# Patient Record
Sex: Male | Born: 1945 | Race: White | Hispanic: No | Smoking: Never smoker
Health system: Southern US, Community
[De-identification: ages and names within clinical notes are randomized; demographics above are authoritative.]

## PROBLEM LIST (undated history)

## (undated) DIAGNOSIS — D649 Anemia, unspecified: Secondary | ICD-10-CM

## (undated) DIAGNOSIS — K219 Gastro-esophageal reflux disease without esophagitis: Secondary | ICD-10-CM

## (undated) DIAGNOSIS — I10 Essential (primary) hypertension: Secondary | ICD-10-CM

## (undated) DIAGNOSIS — I639 Cerebral infarction, unspecified: Secondary | ICD-10-CM

## (undated) DIAGNOSIS — F909 Attention-deficit hyperactivity disorder, unspecified type: Secondary | ICD-10-CM

## (undated) DIAGNOSIS — Z87442 Personal history of urinary calculi: Secondary | ICD-10-CM

## (undated) DIAGNOSIS — G8194 Hemiplegia, unspecified affecting left nondominant side: Secondary | ICD-10-CM

## (undated) HISTORY — PX: APPENDECTOMY: SHX54

---

## 2012-09-01 DIAGNOSIS — F1521 Other stimulant dependence, in remission: Secondary | ICD-10-CM | POA: Insufficient documentation

## 2012-09-02 DIAGNOSIS — H9190 Unspecified hearing loss, unspecified ear: Secondary | ICD-10-CM | POA: Insufficient documentation

## 2013-01-24 DIAGNOSIS — E785 Hyperlipidemia, unspecified: Secondary | ICD-10-CM | POA: Insufficient documentation

## 2013-01-24 DIAGNOSIS — N400 Enlarged prostate without lower urinary tract symptoms: Secondary | ICD-10-CM | POA: Insufficient documentation

## 2013-01-24 DIAGNOSIS — I10 Essential (primary) hypertension: Secondary | ICD-10-CM | POA: Insufficient documentation

## 2013-06-28 DIAGNOSIS — G3184 Mild cognitive impairment, so stated: Secondary | ICD-10-CM | POA: Insufficient documentation

## 2013-07-11 DIAGNOSIS — M1990 Unspecified osteoarthritis, unspecified site: Secondary | ICD-10-CM | POA: Insufficient documentation

## 2013-09-07 DIAGNOSIS — R419 Unspecified symptoms and signs involving cognitive functions and awareness: Secondary | ICD-10-CM | POA: Insufficient documentation

## 2013-09-07 DIAGNOSIS — F6081 Narcissistic personality disorder: Secondary | ICD-10-CM | POA: Insufficient documentation

## 2013-10-04 DIAGNOSIS — F1320 Sedative, hypnotic or anxiolytic dependence, uncomplicated: Secondary | ICD-10-CM | POA: Insufficient documentation

## 2014-05-04 DIAGNOSIS — I639 Cerebral infarction, unspecified: Secondary | ICD-10-CM | POA: Insufficient documentation

## 2014-12-01 DIAGNOSIS — F22 Delusional disorders: Secondary | ICD-10-CM | POA: Insufficient documentation

## 2016-02-11 DIAGNOSIS — G4700 Insomnia, unspecified: Secondary | ICD-10-CM | POA: Insufficient documentation

## 2016-03-12 DIAGNOSIS — F419 Anxiety disorder, unspecified: Secondary | ICD-10-CM | POA: Insufficient documentation

## 2016-12-19 DIAGNOSIS — R296 Repeated falls: Secondary | ICD-10-CM | POA: Insufficient documentation

## 2018-09-15 DIAGNOSIS — F1320 Sedative, hypnotic or anxiolytic dependence, uncomplicated: Secondary | ICD-10-CM | POA: Insufficient documentation

## 2019-06-11 DIAGNOSIS — Z8673 Personal history of transient ischemic attack (TIA), and cerebral infarction without residual deficits: Secondary | ICD-10-CM | POA: Insufficient documentation

## 2019-08-04 DIAGNOSIS — F1021 Alcohol dependence, in remission: Secondary | ICD-10-CM | POA: Insufficient documentation

## 2019-10-18 DIAGNOSIS — H547 Unspecified visual loss: Secondary | ICD-10-CM | POA: Insufficient documentation

## 2020-01-16 DIAGNOSIS — I248 Other forms of acute ischemic heart disease: Secondary | ICD-10-CM | POA: Insufficient documentation

## 2020-01-19 DIAGNOSIS — I441 Atrioventricular block, second degree: Secondary | ICD-10-CM | POA: Insufficient documentation

## 2020-04-12 ENCOUNTER — Emergency Department: Payer: Medicare Other

## 2020-04-12 ENCOUNTER — Emergency Department
Admission: EM | Admit: 2020-04-12 | Discharge: 2020-04-12 | Disposition: A | Discharge status: Skilled Nursing Facility | Payer: Medicare Other | Attending: Student | Admitting: Student

## 2020-04-12 ENCOUNTER — Other Ambulatory Visit: Payer: Self-pay

## 2020-04-12 ENCOUNTER — Encounter: Payer: Self-pay | Admitting: Emergency Medicine

## 2020-04-12 DIAGNOSIS — I10 Essential (primary) hypertension: Secondary | ICD-10-CM | POA: Diagnosis not present

## 2020-04-12 DIAGNOSIS — I69392 Facial weakness following cerebral infarction: Secondary | ICD-10-CM | POA: Insufficient documentation

## 2020-04-12 DIAGNOSIS — R29810 Facial weakness: Secondary | ICD-10-CM | POA: Diagnosis not present

## 2020-04-12 HISTORY — DX: Essential (primary) hypertension: I10

## 2020-04-12 HISTORY — DX: Cerebral infarction, unspecified: I63.9

## 2020-04-12 LAB — CBC
HCT: 41 % (ref 39.0–52.0)
Hemoglobin: 14 g/dL (ref 13.0–17.0)
MCH: 30 pg (ref 26.0–34.0)
MCHC: 34.1 g/dL (ref 30.0–36.0)
MCV: 87.8 fL (ref 80.0–100.0)
Platelets: 358 10*3/uL (ref 150–400)
RBC: 4.67 MIL/uL (ref 4.22–5.81)
RDW: 12.9 % (ref 11.5–15.5)
WBC: 7.9 10*3/uL (ref 4.0–10.5)
nRBC: 0 % (ref 0.0–0.2)

## 2020-04-12 LAB — URINALYSIS, COMPLETE (UACMP) WITH MICROSCOPIC
Bilirubin Urine: NEGATIVE
Glucose, UA: NEGATIVE mg/dL
Hgb urine dipstick: NEGATIVE
Ketones, ur: NEGATIVE mg/dL
Nitrite: NEGATIVE
Protein, ur: NEGATIVE mg/dL
Specific Gravity, Urine: 1.013 (ref 1.005–1.030)
Squamous Epithelial / HPF: NONE SEEN (ref 0–5)
pH: 5 (ref 5.0–8.0)

## 2020-04-12 LAB — DIFFERENTIAL
Abs Immature Granulocytes: 0.04 10*3/uL (ref 0.00–0.07)
Basophils Absolute: 0.1 10*3/uL (ref 0.0–0.1)
Basophils Relative: 1 %
Eosinophils Absolute: 0.2 10*3/uL (ref 0.0–0.5)
Eosinophils Relative: 3 %
Immature Granulocytes: 1 %
Lymphocytes Relative: 31 %
Lymphs Abs: 2.5 10*3/uL (ref 0.7–4.0)
Monocytes Absolute: 0.6 10*3/uL (ref 0.1–1.0)
Monocytes Relative: 8 %
Neutro Abs: 4.4 10*3/uL (ref 1.7–7.7)
Neutrophils Relative %: 56 %

## 2020-04-12 LAB — COMPREHENSIVE METABOLIC PANEL
ALT: 23 U/L (ref 0–44)
AST: 18 U/L (ref 15–41)
Albumin: 4.4 g/dL (ref 3.5–5.0)
Alkaline Phosphatase: 130 U/L — ABNORMAL HIGH (ref 38–126)
Anion gap: 9 (ref 5–15)
BUN: 16 mg/dL (ref 8–23)
CO2: 22 mmol/L (ref 22–32)
Calcium: 9.2 mg/dL (ref 8.9–10.3)
Chloride: 104 mmol/L (ref 98–111)
Creatinine, Ser: 0.84 mg/dL (ref 0.61–1.24)
GFR calc Af Amer: >60 mL/min (ref >60)
GFR calc non Af Amer: >60 mL/min (ref >60)
Glucose, Bld: 104 mg/dL — ABNORMAL HIGH (ref 70–99)
Potassium: 4.3 mmol/L (ref 3.5–5.1)
Sodium: 135 mmol/L (ref 135–145)
Total Bilirubin: 0.6 mg/dL (ref 0.3–1.2)
Total Protein: 7.1 g/dL (ref 6.5–8.1)

## 2020-04-12 LAB — APTT: aPTT: 32 seconds (ref 24–36)

## 2020-04-12 LAB — PROTIME-INR
INR: 0.9 (ref 0.8–1.2)
Prothrombin Time: 11.9 seconds (ref 11.4–15.2)

## 2020-04-12 MED ORDER — METOCLOPRAMIDE HCL 5 MG/ML IJ SOLN
10.0000 mg | Freq: Once | INTRAMUSCULAR | Status: AC
  Administered 2020-04-12: 10 mg via INTRAVENOUS
  Filled 2020-04-12: qty 2

## 2020-04-12 MED ORDER — CLONAZEPAM 0.5 MG PO TABS: 1.0000 mg | ORAL_TABLET | Freq: Once | ORAL | Status: DC

## 2020-04-12 MED ORDER — CLONAZEPAM 0.5 MG PO TABS
0.5000 mg | ORAL_TABLET | Freq: Once | ORAL | Status: AC
  Administered 2020-04-12: 0.5 mg via ORAL
  Filled 2020-04-12: qty 1

## 2020-04-12 MED ORDER — KETOROLAC TROMETHAMINE 15 MG/ML IJ SOLN
15.0000 mg | Freq: Once | INTRAMUSCULAR | Status: AC
  Administered 2020-04-12: 15 mg via INTRAVENOUS
  Filled 2020-04-12: qty 1

## 2020-04-12 MED ORDER — HYDROXYZINE HCL 25 MG PO TABS
25.0000 mg | ORAL_TABLET | Freq: Once | ORAL | Status: AC
  Administered 2020-04-12: 25 mg via ORAL
  Filled 2020-04-12: qty 1

## 2020-04-12 MED ORDER — ACETAMINOPHEN 500 MG PO TABS
1000.0000 mg | ORAL_TABLET | Freq: Once | ORAL | Status: AC
  Administered 2020-04-12: 1000 mg via ORAL
  Filled 2020-04-12: qty 2

## 2020-04-12 MED ORDER — SODIUM CHLORIDE 0.9 % IV BOLUS
500.0000 mL | Freq: Once | INTRAVENOUS | Status: AC
  Administered 2020-04-12: 500 mL via INTRAVENOUS

## 2020-04-12 NOTE — ED Provider Notes (Signed)
[facility] Emergency Department Provider Note  ____________________________________________   First MD Initiated Contact with Patient 04/12/20 1212     (approximate)  I have reviewed the triage vital signs and the nursing notes.  History  Chief Complaint Weakness    HPI Johnny Rice is a 74 y.o. 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  Patient states over the last several days he has had a headache.  Located to the top of his head, constant, describes it as throbbing, moderate in severity, no alleviating/aggravating components.  Not worst at onset or worst of life.  Patient states over the same time period he feels like his chronic left-sided facial droop feels worse than normal.  Also reports that his baseline decrease sensation to the left side of the face seems more prominent than normal.  He denies any extremity weakness, numbness, tingling.  Denies any visual changes or speech difficulties.  He is also requesting a dose of clonazepam, which he is prescribed.  He states the living facility that he is at does not give them to him as is written.   Past Medical Hx Past Medical History:  Diagnosis Date  . Hypertension   . Stroke [facility]     Problem List There are no problems to display for this patient.   Past Surgical Hx History reviewed. No pertinent surgical history.  Medications Prior to Admission medications   Not on File    Allergies Patient has no known allergies.  Family Hx History reviewed. No pertinent family history.  Social Hx Social History   Tobacco Use  . Smoking status: Never Smoker  . Smokeless tobacco: Never Used  Substance Use Topics  . Alcohol use: Not on file  . Drug use: Not on file     Review of Systems  Constitutional: Negative for fever. Negative for chills. Eyes: Negative for visual  changes. ENT: Negative for sore throat. Cardiovascular: Negative for chest pain. Respiratory: Negative for shortness of breath. Gastrointestinal: Negative for nausea. Negative for vomiting.  Genitourinary: Negative for dysuria. Musculoskeletal: Negative for leg swelling. Skin: Negative for rash. Neurological: + for headaches, "worse" facial droop.   Physical Exam  Vital Signs: ED Triage Vitals  Enc Vitals Group     BP 04/12/20 1148 (!) 169/102     Pulse Rate 04/12/20 1148 73     Resp 04/12/20 1148 16     Temp 04/12/20 1148 98.3 F (36.8 C)     Temp Source 04/12/20 1148 Oral     SpO2 04/12/20 1148 96 %     Weight 04/12/20 1149 200 lb (90.7 kg)     Height 04/12/20 1149 6' (1.829 m)     Head Circumference --      Peak Flow --      Pain Score 04/12/20 1148 10     Pain Loc --      Pain Edu? --      Excl. in GC? --     Constitutional: Alert and oriented. Resting comfortably in bed, NAD. Continually asking for benzos.  Head: Normocephalic. Atraumatic. See neurological below for facial findings.  Eyes: Conjunctivae clear. Sclera anicteric. Pupils equal and symmetric. Nose: No masses or lesions. No congestion or rhinorrhea. Mouth/Throat: Wearing mask.  Neck: No stridor. Trachea midline.  Cardiovascular: Normal rate, regular rhythm. Extremities well perfused. Respiratory: Normal respiratory effort.  Lungs CTAB. Gastrointestinal: Soft. Non-distended. Non-tender.  Genitourinary:  Deferred. Musculoskeletal: No lower extremity edema. No deformities. Neurologic:  Left sided facial droop, and subjective decreased sensation to L face compared to R. Patient reports history of L sided droop 2/2 old stroke, but feels today's is "worse." Unable to eyebrow raise on L, patient is unsure if this is old or new. No dysarthria or aphasia. Otherwise, UE and LE strength grossly equal and symmetric. UE and LE SILT. Skin: Skin is warm, dry and intact. No rash noted. Psychiatric: Mood and affect are  appropriate for situation.  EKG  Personally reviewed and interpreted by myself.   Date: 04/12/20 Time: 1150 Rate: 67 Rhythm: sinus Axis: left Intervals: prolonged QT No STEMI    Radiology  Personally reviewed available imaging myself.   CT head - IMPRESSION:  1. No acute intracranial findings.  2. Chronic right PCA territory infarct.  3. Chronic microvascular ischemic change and cerebral volume loss.   MRI ordered, pending   Procedures  Procedure(s) performed (including critical care):  Procedures   Initial Impression / Assessment and Plan / MDM / ED Course  74 y.o. male who presents to the ED planing of headache, and "worse" facial droop than normal (history of facial droop in the setting of an old stroke).  Also complaining that his current living facility is not administering him his benzodiazepines as desired.  Ddx: TIA, acute on chronic CVA, Bell's palsy, migraine, complex migraine, metabolic or infectious abnormality (such as UTI) causing recrudescence of old stroke symptoms.  Patient seems extremely fixated on obtaining Klonopin and has asked several times for his dose (including asking myself and several different nurses). Do have concern that part of his presentation does represent some medication seeking tendencies.  Did confirm on PDMP review that the patient is prescribed 0.5 mg tabs of clonazepam.  Will give a dose here as he is due for one.  However, his exam, including his vitals and heart rate do not demonstrate any concern for acute benzodiazepine withdrawal at this time.  Will plan for labs, imaging, headache treatment and reassess  Labs without actionable derangements.  Normal creatinine.  Urine negative for infection.  CT head negative for any acute findings, old infarct seen.  Will obtain MRI to rule out any acute on chronic CVA, if negative anticipate discharge.   _______________________________   As part of my medical decision making I have  reviewed available labs, radiology tests, reviewed old records/performed chart review.    Final Clinical Impression(s) / ED Diagnosis  Left-sided facial droop    Note:  This document was prepared using Dragon voice recognition software and may include unintentional dictation errors.   Lilia Pro., MD 04/12/20 1536

## 2020-04-12 NOTE — ED Notes (Signed)
Pt transported to MRI 

## 2020-04-12 NOTE — ED Notes (Signed)
Pt states that procedure made him anxious. Pt given verbal reassurance, EDP aware.

## 2020-04-12 NOTE — ED Notes (Signed)
Pt repeatedly getting OOB, pt encouraged to use call bell. Pt states he is anxious, pt given verbal reassurance and put back in bed.

## 2020-04-12 NOTE — ED Notes (Signed)
Pt calling out on call bell. RN in room. Pt stating that he has a bad headache and is "shaking". RN asked the pt if the tylenol, toradol, and reglan helped with his headache, he stated that the tylenol did not do anything for his pain but the other meds helped. Pt asked for something for anxiety, pt was informed that he was given klonopin already. Pt requesting to have another half dose of klonopin.  This RN spoke with Dr. Colon Branch who declines to order additional medication at this time. Pt was informed.

## 2020-04-12 NOTE — ED Triage Notes (Signed)
Pt presents from [facility] via [facility] with c/o dizziness/weakness for past 3 days. Hx of stroke in 2005, multiple TIAs according to facility. Facial droop and left arm weakness is baseline for patient. However patient reports more trouble finding words since he woke up this am. CBG 104. Pt also c/o posterior headache at this time. Pt currently alert and oriented x4.

## 2020-04-12 NOTE — ED Notes (Signed)
Pt wanted RN to sign for him, verbal statement given that pt understands discharge and discharge instructions and that dc instructions to be given to SNF

## 2020-04-12 NOTE — Discharge Instructions (Addendum)
Thank you for letting us take care of you in the emergency department today.   Please continue to take any regular, prescribed medications.   Please follow up with: Your primary care doctor to review your ER visit and follow up on your symptoms.   Please return to the ER for any new or worsening symptoms.   No evidence of acute intracranial abnormality, including acute  infarction.     Redemonstrated large chronic right PCA territory infarct with  associated ex vacuo dilatation of the right lateral ventricle.     Mild generalized parenchymal atrophy and chronic small vessel  ischemic disease.     Tiny left maxillary sinus mucous retention cyst. Trace right mastoid  effusion.     Sequela of prior left mastoidectomy.

## 2020-04-12 NOTE — ED Provider Notes (Signed)
3:07 PM Assumed care for off going team.   Blood pressure (!) 169/102, pulse 73, temperature 98.3 F (36.8 C), temperature source Oral, resp. rate 16, height 6' (1.829 m), weight 90.7 kg, SpO2 96 %.  See their HPI for full report but in brief Prior stroke with left sided facial droop-in assisted living. Says his droop/numbness/headache is worse going on for several days. Infectious workup negative. MRI pending. DC if MRI negative.   4:13 PM on reevaluation of patient he is requesting for something more for his anxiety after having the MRI.  Discussed with patient that he already been given his Klonopin.  We will give a dose of hydroxyzine.  However discussed with patient the MRI was negative at this time we will discharge patient back to his facility     No evidence of acute intracranial abnormality, including acute  infarction.    Redemonstrated large chronic right PCA territory infarct with  associated ex vacuo dilatation of the right lateral ventricle.    Mild generalized parenchymal atrophy and chronic small vessel  ischemic disease.    Tiny left maxillary sinus mucous retention cyst. Trace right mastoid  effusion.    Sequela of prior left mastoidectomy.            Concha Se, MD 04/12/20 [id number]

## 2020-07-02 ENCOUNTER — Ambulatory Visit (INDEPENDENT_AMBULATORY_CARE_PROVIDER_SITE_OTHER): Payer: Medicare Other | Admitting: Urology

## 2020-07-02 ENCOUNTER — Other Ambulatory Visit: Payer: Self-pay

## 2020-07-02 ENCOUNTER — Encounter: Payer: Self-pay | Admitting: Urology

## 2020-07-02 VITALS — BP 174/106 | HR 77 | Ht 68.0 in | Wt 200.0 lb

## 2020-07-02 DIAGNOSIS — R8281 Pyuria: Secondary | ICD-10-CM

## 2020-07-02 DIAGNOSIS — R300 Dysuria: Secondary | ICD-10-CM | POA: Diagnosis not present

## 2020-07-02 LAB — BLADDER SCAN AMB NON-IMAGING

## 2020-07-02 MED ORDER — SULFAMETHOXAZOLE-TRIMETHOPRIM 800-160 MG PO TABS: 1.0000 | ORAL_TABLET | Freq: Two times a day (BID) | ORAL | 0 refills | Status: DC

## 2020-07-02 NOTE — Progress Notes (Signed)
   07/02/20 11:48 AM   Johnny Rice 10-31-1945 [id number]  CC: Urinary symptoms  HPI: I saw [name] in urology clinic for a number of urinary symptoms including urge incontinence, urgency, frequency, weak stream, nocturia.  He reports his symptoms have been going on for 1 month.  He denies any urinary symptoms previously.  He denies any history of gross hematuria, UTI, or urinary retention.  His past medical history is notable for stroke, and he resides in a facility.  He has also had some burning with urination.  He drinks water, Pepsi, and tea during the day.  He also has gained 25 pounds in the last month.  He also feels his penis is shrinking.  He has also had trouble with constipation over the last couple months.  PVR today is normal at 70 mL.  Urinalysis notable for greater than 30 WBCs, 0-2 RBCs, no bacteria, nitrite negative, 1+ leukocytes.  Sent for culture.   PMH: Past Medical History:  Diagnosis Date  . Hypertension   . Stroke [facility])     Surgical History: Past Surgical History:  Procedure Laterality Date  . APPENDECTOMY      Family History: Family History  Family history unknown: Yes    Social History:  reports that he has never smoked. He has never used smokeless tobacco. No history on file for alcohol use and drug use.  Physical Exam: BP (!) 174/106   Pulse 77   Ht 5\' 8"  (1.727 m)   Wt 200 lb (90.7 kg)   BMI 30.41 kg/m    Constitutional: Frail-appearing, in wheelchair Cardiovascular: No clubbing, cyanosis, or edema. Respiratory: Normal respiratory effort, no increased work of breathing. GI: Abdomen is soft, nontender, nondistended, no abdominal masses GU: Circumcised phallus with patent meatus, no lesions  Laboratory Data: Reviewed, see HPI  Pertinent Imaging: None to review  Assessment & Plan:   In summary, he is a comorbid 74 year old male with history of stroke who was at 25 pounds of weight gain in the last month, trouble with constipation ,  and new urinary symptoms over the last month of urgency, urge incontinence, frequency, weak stream, and feeling of incomplete emptying.  PVR is normal today and clinic, but he has significant pyuria on urinalysis.  We discussed at length that his penis shrinkage is likely secondary to his significant weight gain.  I recommended a course of Bactrim for possible prostatitis, with close follow-up in 3 to 4 weeks for cystoscopy if no improvement in his symptoms.  Would also need to consider cross-sectional imaging at that time in the setting of his significant pyuria and urinary symptoms.  Bactrim DS twice daily x14 days for possible prostatitis Follow-up urine culture RTC 3 to 4 weeks for cystoscopy if symptoms not improved   64, MD 07/02/2020  [facility] [address], [address] [place], [place] [phone]

## 2020-07-02 NOTE — Patient Instructions (Signed)
Cystoscopy Cystoscopy is a procedure that is used to help diagnose and sometimes treat conditions that affect the lower urinary tract. The lower urinary tract includes the bladder and the urethra. The urethra is the tube that drains urine from the bladder. Cystoscopy is done using a thin, tube-shaped instrument with a light and camera at the end (cystoscope). The cystoscope may be hard or flexible, depending on the goal of the procedure. The cystoscope is inserted through the urethra, into the bladder. Cystoscopy may be recommended if you have:  Urinary tract infections that keep coming back.  Blood in the urine (hematuria).  An inability to control when you urinate (urinary incontinence) or an overactive bladder.  Unusual cells found in a urine sample.  A blockage in the urethra, such as a urinary stone.  Painful urination.  An abnormality in the bladder found during an intravenous pyelogram (IVP) or CT scan. Cystoscopy may also be done to remove a sample of tissue to be examined under a microscope (biopsy). What are the risks? Generally, this is a safe procedure. However, problems may occur, including:  Infection.  Bleeding.  What happens during the procedure?  1. You will be given one or more of the following: ? A medicine to numb the area (local anesthetic). 2. The area around the opening of your urethra will be cleaned. 3. The cystoscope will be passed through your urethra into your bladder. 4. Germ-free (sterile) fluid will flow through the cystoscope to fill your bladder. The fluid will stretch your bladder so that your health care provider can clearly examine your bladder walls. 5. Your doctor will look at the urethra and bladder. 6. The cystoscope will be removed The procedure may vary among health care providers  What can I expect after the procedure? After the procedure, it is common to have: 1. Some soreness or pain in your abdomen and urethra. 2. Urinary symptoms.  These include: ? Mild pain or burning when you urinate. Pain should stop within a few minutes after you urinate. This may last for up to 1 week. ? A small amount of blood in your urine for several days. ? Feeling like you need to urinate but producing only a small amount of urine. Follow these instructions at home: General instructions  Return to your normal activities as told by your health care provider.   Do not drive for 24 hours if you were given a sedative during your procedure.  Watch for any blood in your urine. If the amount of blood in your urine increases, call your health care provider.  If a tissue sample was removed for testing (biopsy) during your procedure, it is up to you to get your test results. Ask your health care provider, or the department that is doing the test, when your results will be ready.  Drink enough fluid to keep your urine pale yellow.  Keep all follow-up visits as told by your health care provider. This is important. Contact a health care provider if you:  Have pain that gets worse or does not get better with medicine, especially pain when you urinate.  Have trouble urinating.  Have more blood in your urine. Get help right away if you:  Have blood clots in your urine.  Have abdominal pain.  Have a fever or chills.  Are unable to urinate. Summary  Cystoscopy is a procedure that is used to help diagnose and sometimes treat conditions that affect the lower urinary tract.  Cystoscopy is done using   a thin, tube-shaped instrument with a light and camera at the end.  After the procedure, it is common to have some soreness or pain in your abdomen and urethra.  Watch for any blood in your urine. If the amount of blood in your urine increases, call your health care provider.  If you were prescribed an antibiotic medicine, take it as told by your health care provider. Do not stop taking the antibiotic even if you start to feel better. This  information is not intended to replace advice given to you by your health care provider. Make sure you discuss any questions you have with your health care provider. Document Revised: 09/28/2018 Document Reviewed: 09/28/2018 Elsevier Patient Education  2020 Elsevier Inc.   

## 2020-07-02 NOTE — Addendum Note (Signed)
Addended by: Martha Clan on: 07/02/2020 11:55 AM   Modules accepted: Orders

## 2020-07-03 LAB — URINALYSIS, COMPLETE
Bilirubin, UA: NEGATIVE
Glucose, UA: NEGATIVE
Ketones, UA: NEGATIVE
Nitrite, UA: NEGATIVE
Protein,UA: NEGATIVE
Specific Gravity, UA: 1.015 (ref 1.005–1.030)
Urobilinogen, Ur: 0.2 mg/dL (ref 0.2–1.0)
pH, UA: 5.5 (ref 5.0–7.5)

## 2020-07-03 LAB — MICROSCOPIC EXAMINATION
Bacteria, UA: NONE SEEN
WBC, UA: >30 /hpf — AB (ref 0–5)

## 2020-07-06 LAB — CULTURE, URINE COMPREHENSIVE

## 2020-07-10 ENCOUNTER — Other Ambulatory Visit: Payer: Self-pay

## 2020-07-10 ENCOUNTER — Emergency Department
Admission: EM | Admit: 2020-07-10 | Discharge: 2020-07-10 | Disposition: A | Discharge status: Home / Self Care | Payer: Medicare Other | Attending: Emergency Medicine | Admitting: Emergency Medicine

## 2020-07-10 ENCOUNTER — Telehealth: Payer: Self-pay

## 2020-07-10 DIAGNOSIS — Z5321 Procedure and treatment not carried out due to patient leaving prior to being seen by health care provider: Secondary | ICD-10-CM | POA: Diagnosis not present

## 2020-07-10 DIAGNOSIS — R109 Unspecified abdominal pain: Secondary | ICD-10-CM | POA: Insufficient documentation

## 2020-07-10 DIAGNOSIS — R300 Dysuria: Secondary | ICD-10-CM | POA: Insufficient documentation

## 2020-07-10 DIAGNOSIS — N390 Urinary tract infection, site not specified: Secondary | ICD-10-CM | POA: Insufficient documentation

## 2020-07-10 DIAGNOSIS — R8281 Pyuria: Secondary | ICD-10-CM

## 2020-07-10 LAB — CBC
HCT: 37.8 % — ABNORMAL LOW (ref 39.0–52.0)
Hemoglobin: 12.4 g/dL — ABNORMAL LOW (ref 13.0–17.0)
MCH: 30.3 pg (ref 26.0–34.0)
MCHC: 32.8 g/dL (ref 30.0–36.0)
MCV: 92.4 fL (ref 80.0–100.0)
Platelets: 304 10*3/uL (ref 150–400)
RBC: 4.09 MIL/uL — ABNORMAL LOW (ref 4.22–5.81)
RDW: 15.2 % (ref 11.5–15.5)
WBC: 6.1 10*3/uL (ref 4.0–10.5)
nRBC: 0 % (ref 0.0–0.2)

## 2020-07-10 LAB — BASIC METABOLIC PANEL
Anion gap: 10 (ref 5–15)
BUN: 11 mg/dL (ref 8–23)
CO2: 23 mmol/L (ref 22–32)
Calcium: 8.8 mg/dL — ABNORMAL LOW (ref 8.9–10.3)
Chloride: 103 mmol/L (ref 98–111)
Creatinine, Ser: 1.06 mg/dL (ref 0.61–1.24)
GFR calc Af Amer: >60 mL/min (ref >60)
GFR calc non Af Amer: >60 mL/min (ref >60)
Glucose, Bld: 117 mg/dL — ABNORMAL HIGH (ref 70–99)
Potassium: 3.8 mmol/L (ref 3.5–5.1)
Sodium: 136 mmol/L (ref 135–145)

## 2020-07-10 MED ORDER — NITROFURANTOIN MONOHYD MACRO 100 MG PO CAPS: 100.0000 mg | ORAL_CAPSULE | Freq: Two times a day (BID) | ORAL | 0 refills | Status: AC

## 2020-07-10 NOTE — Telephone Encounter (Signed)
-----   Message from Sondra Come, MD sent at 07/10/2020  8:58 AM EDT ----- Lets change him to nitrofurantoin 100mg  BID  x 10 DAYS, ok to stop bactrim,thanks  , MD 07/10/2020

## 2020-07-10 NOTE — ED Triage Notes (Signed)
Pt to ed via ems from the [facility]. C/o abd pain and dysuria. PT is on an antibiotic for a kidney infection, has been for 5-6 days and has not improved. VSS. No fevers.

## 2020-07-10 NOTE — Telephone Encounter (Signed)
Called pt no answer. Unable to leave message per DPR. 1st attempt. RX sent.

## 2020-07-11 NOTE — Telephone Encounter (Signed)
Called pt no answer. Unable to leave detailed message per DPR. 2nd attempt.

## 2020-07-12 NOTE — Telephone Encounter (Signed)
Talked with pharmacy to tell patient to stop the bactrim

## 2020-07-31 ENCOUNTER — Ambulatory Visit: Payer: Medicare Other | Admitting: Podiatry

## 2020-08-09 ENCOUNTER — Other Ambulatory Visit: Payer: Self-pay

## 2020-08-09 ENCOUNTER — Ambulatory Visit (INDEPENDENT_AMBULATORY_CARE_PROVIDER_SITE_OTHER): Payer: Medicare Other | Admitting: Urology

## 2020-08-09 ENCOUNTER — Encounter: Payer: Self-pay | Admitting: Urology

## 2020-08-09 ENCOUNTER — Other Ambulatory Visit: Payer: Self-pay | Admitting: Urology

## 2020-08-09 VITALS — BP 161/107 | HR 76 | Ht 68.0 in | Wt 200.0 lb

## 2020-08-09 DIAGNOSIS — R8281 Pyuria: Secondary | ICD-10-CM | POA: Diagnosis not present

## 2020-08-09 DIAGNOSIS — N3281 Overactive bladder: Secondary | ICD-10-CM | POA: Diagnosis not present

## 2020-08-09 DIAGNOSIS — R300 Dysuria: Secondary | ICD-10-CM | POA: Diagnosis not present

## 2020-08-09 MED ORDER — LIDOCAINE HCL URETHRAL/MUCOSAL 2 % EX GEL
1.0000 "application " | Freq: Once | CUTANEOUS | Status: AC
  Administered 2020-08-09: 1 via URETHRAL

## 2020-08-09 NOTE — Progress Notes (Signed)
Cystoscopy Procedure Note:  Indication: Urinary symptoms, persistent pyuria  After informed consent and discussion of the procedure and its risks, Pau Banh was positioned and prepped in the standard fashion. Cystoscopy was performed with a flexible cystoscope. The urethra, bladder neck and entire bladder was visualized in a standard fashion. The prostate was short. The ureteral orifices were visualized in their normal location and orientation.  Slightly cloudy urine throughout, subtly erythematous bladder mucosa, but no definitive lesions or tumors.  Cytology sent.  Findings: Subtle bladder erythema but no distinct tumors, cytology sent ----------------------------------------------------------------------  Assessment and Plan: 74 year old male with urinary urgency and frequency and pyuria.  Previous urine culture on 9/13 grew 9000 colonies of staph and 6000 colonies of Enterococcus, and he was treated with culture appropriate nitrofurantoin for 10 days without any significant improvement in his urinary symptoms.  He continues to drink primarily soda and tea during the day.  He was also concerned again about his penis shrinking.  We discussed this is likely secondary to his 25 pound weight gain over the last few months, and will improve with weight loss.  We discussed that overactive bladder (OAB) is not a disease, but is a symptom complex that is generally not life-threatening.  Symptoms typically include urinary urgency, frequency, and urge incontinence.  There are numerous treatment options, however there are risks and benefits with both medical and surgical management.  First-line treatment is behavioral therapies including bladder training, pelvic floor muscle training, and fluid management.  Second line treatments include oral antimuscarinics(Ditropan er, Trospium) and beta-3 agonist (Mybetriq). There is typically a period of medication trial (4-8 weeks) to find the optimal therapy and  dosing. If symptoms are bothersome despite the above management, third line options include intra-detrusor botox, peripheral tibial nerve stimulation (PTNS), and interstim (SNS). These are more invasive treatments with higher side effect profile, but may improve quality of life for patients with severe OAB symptoms.   -Follow-up urine culture results and urine cytology-call with results and check on urinary symptoms at that time.  Consider further imaging if persistent symptoms and pyuria -Trial of Myrbetriq 50 mg daily -Behavioral strategies discussed again  Legrand Rams, MD 08/09/2020

## 2020-08-10 LAB — URINALYSIS, COMPLETE
Bilirubin, UA: NEGATIVE
Glucose, UA: NEGATIVE
Ketones, UA: NEGATIVE
Nitrite, UA: NEGATIVE
Protein,UA: NEGATIVE
Specific Gravity, UA: 1.02 (ref 1.005–1.030)
Urobilinogen, Ur: 0.2 mg/dL (ref 0.2–1.0)
pH, UA: 5.5 (ref 5.0–7.5)

## 2020-08-10 LAB — MICROSCOPIC EXAMINATION
Bacteria, UA: NONE SEEN
WBC, UA: >30 /hpf — AB (ref 0–5)

## 2020-08-13 LAB — CYTOLOGY - NON PAP

## 2020-08-14 ENCOUNTER — Telehealth: Payer: Self-pay

## 2020-08-14 NOTE — Telephone Encounter (Signed)
Called pt informed him of the information below. Pt gave verbal understanding. Appt scheduled.  

## 2020-08-14 NOTE — Telephone Encounter (Signed)
-----   Message from Sondra Come, MD sent at 08/14/2020  8:12 AM EDT ----- No cancer cells on urine sample, please offer virtual visit in 4 weeks for OAB symptom check, thanks  Legrand Rams, MD 08/14/2020

## 2020-08-16 LAB — CULTURE, URINE COMPREHENSIVE

## 2020-08-17 ENCOUNTER — Telehealth: Payer: Self-pay

## 2020-08-17 MED ORDER — AMOXICILLIN 500 MG PO TABS: 500.0000 mg | ORAL_TABLET | Freq: Two times a day (BID) | ORAL | 0 refills | Status: DC

## 2020-08-17 NOTE — Telephone Encounter (Signed)
Left patient a detailed message notifying. Script sent to pharmacy

## 2020-08-17 NOTE — Telephone Encounter (Signed)
-----   Message from Sondra Come, MD sent at 08/16/2020  4:32 PM EDT ----- Lets do amoxicillin 500mg  BID X 10 days for his enterococcus UTI. Keep follow up as scheduled  , MD 08/16/2020

## 2020-08-17 NOTE — Telephone Encounter (Signed)
Patient notified

## 2020-09-11 ENCOUNTER — Telehealth (INDEPENDENT_AMBULATORY_CARE_PROVIDER_SITE_OTHER): Payer: Medicare Other | Admitting: Urology

## 2020-09-11 ENCOUNTER — Other Ambulatory Visit: Payer: Self-pay

## 2020-09-11 DIAGNOSIS — N401 Enlarged prostate with lower urinary tract symptoms: Secondary | ICD-10-CM | POA: Diagnosis not present

## 2020-09-11 DIAGNOSIS — N138 Other obstructive and reflux uropathy: Secondary | ICD-10-CM | POA: Diagnosis not present

## 2020-09-11 DIAGNOSIS — N3281 Overactive bladder: Secondary | ICD-10-CM | POA: Diagnosis not present

## 2020-09-11 MED ORDER — TAMSULOSIN HCL 0.4 MG PO CAPS: 0.4000 mg | ORAL_CAPSULE | Freq: Every day | ORAL | 11 refills | Status: DC

## 2020-09-11 NOTE — Progress Notes (Signed)
Virtual Visit via Telephone Note  I connected with Johnny Rice on 09/11/20 at 11:30 AM EST by telephone and verified that I am speaking with the correct person using two identifiers.   Patient location: Home Provider location: [facility]   I discussed the limitations, risks, security and privacy concerns of performing an evaluation and management service by telephone and the availability of in person appointments. We discussed the impact of the COVID-19 pandemic on the healthcare system, and the importance of social distancing and reducing patient and provider exposure. I also discussed with the patient that there may be a patient responsible charge related to this service. The patient expressed understanding and agreed to proceed.  Reason for visit: Urinary symptoms  History of Present Illness: I had phone follow-up with [name] regarding his urinary symptoms.  I originally met him in September 2021 when he was having urinary symptoms of urgency, frequency, weak stream, nocturia, dribbling, and urge incontinence.  PVR was normal at that visit, but urinalysis was concerning for infection and ultimately grew small amounts of Enterococcus and staph hemolyticus.  He was treated with culture appropriate antibiotics with no improvement in his urinary symptoms.  He underwent a cystoscopy for further evaluation on 08/09/2020 and the prostate was small and bladder was grossly normal-appearing.  Cytology was negative.  He has not had any abdominal imaging.  At our last visit we tried another course of culture appropriate amoxicillin for 50-100k Enterococcus in the urine, as well as Myrbetriq, and he did not notice any improvement after taking either these medications.  His primary complaint is post void dribbling that requires him to put a towel down near the toilet, as well as urinary frequency.  He is not having significant urge incontinence.  He continues to drink soda and tea during the  day, and I reviewed behavioral strategies again at length.  I recommended a trial of Flomax to see if this improves his urinary symptoms.  Risks and benefits were discussed at length I offered him in person follow-up for a repeat IPSS and PVR, but he would like to continue virtual follow-up, as it is difficult for him to get to the clinic.  Follow Up: Trial of Flomax 0.4 mg nightly Virtual follow-up 6 weeks Consider further abdominal imaging if persistent pyuria and urinary symptoms   I discussed the assessment and treatment plan with the patient. The patient was provided an opportunity to ask questions and all were answered. The patient agreed with the plan and demonstrated an understanding of the instructions.   The patient was advised to call back or seek an in-person evaluation if the symptoms worsen or if the condition fails to improve as anticipated.  I provided 12 minutes of non-face-to-face time during this encounter.   Billey Co, MD

## 2020-10-23 ENCOUNTER — Telehealth: Payer: Medicare Other | Admitting: Urology

## 2021-01-16 ENCOUNTER — Emergency Department
Admission: EM | Admit: 2021-01-16 | Discharge: 2021-01-16 | Disposition: A | Discharge status: Home / Self Care | Payer: Medicare Other | Attending: Emergency Medicine | Admitting: Emergency Medicine

## 2021-01-16 ENCOUNTER — Other Ambulatory Visit: Payer: Self-pay

## 2021-01-16 ENCOUNTER — Emergency Department: Payer: Medicare Other

## 2021-01-16 ENCOUNTER — Encounter: Payer: Self-pay | Admitting: Emergency Medicine

## 2021-01-16 DIAGNOSIS — I10 Essential (primary) hypertension: Secondary | ICD-10-CM | POA: Insufficient documentation

## 2021-01-16 DIAGNOSIS — W19XXXA Unspecified fall, initial encounter: Secondary | ICD-10-CM

## 2021-01-16 DIAGNOSIS — M549 Dorsalgia, unspecified: Secondary | ICD-10-CM | POA: Diagnosis not present

## 2021-01-16 DIAGNOSIS — Z79899 Other long term (current) drug therapy: Secondary | ICD-10-CM | POA: Insufficient documentation

## 2021-01-16 DIAGNOSIS — W01198A Fall on same level from slipping, tripping and stumbling with subsequent striking against other object, initial encounter: Secondary | ICD-10-CM | POA: Insufficient documentation

## 2021-01-16 DIAGNOSIS — R519 Headache, unspecified: Secondary | ICD-10-CM | POA: Diagnosis not present

## 2021-01-16 DIAGNOSIS — Z7982 Long term (current) use of aspirin: Secondary | ICD-10-CM | POA: Insufficient documentation

## 2021-01-16 DIAGNOSIS — R29810 Facial weakness: Secondary | ICD-10-CM | POA: Insufficient documentation

## 2021-01-16 MED ORDER — ACETAMINOPHEN 325 MG PO TABS
650.0000 mg | ORAL_TABLET | Freq: Once | ORAL | Status: AC
  Administered 2021-01-16: 650 mg via ORAL
  Filled 2021-01-16: qty 2

## 2021-01-16 NOTE — ED Notes (Signed)
Pt given some juice and graham crackers for snack while awaiting his ride back to the [place].

## 2021-01-16 NOTE — ED Provider Notes (Signed)
[facility]-EMERGENCY DEPARTMENT  ____________________________________________  Time seen: Approximately 7:37 PM  I have reviewed the triage vital signs and the nursing notes.   HISTORY  Chief Complaint Fall   Historian Patient     HPI Johnny Rice is a 75 y.o. male presents to the emergency department after patient had a mechanical fall.  Patient states he tripped over his carpet and hit the back of his head.  Patient states he has been able to ambulate since fall occurred.  He denies new neck pain or numbness or tingling in the upper and lower extremities.  He is having some upper back pain.  No abrasions or lacerations.  No chest pain, chest tightness or abdominal pain.   Past Medical History:  Diagnosis Date  . Hypertension   . Stroke [facility])      Immunizations up to date:  Yes.     Past Medical History:  Diagnosis Date  . Hypertension   . Stroke [facility])     Patient Active Problem List   Diagnosis Date Noted  . 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  . Demand ischemia of myocardium (HCC) 01/16/2020  . Severe visual impairment 10/18/2019  . Alcohol use disorder, severe, 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HCC) 08/04/2019  . History of CVA (cerebrovascular accident) 06/11/2019  . 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 Frequent falls 12/19/2016  . Anxiety disorder, unspecified 03/12/2016  . Insomnia 02/11/2016  . 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 Cerebrovascular accident (CVA) (HCC) 05/04/2014  . 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  . 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 Neurocognitive disorder 09/07/2013  . Osteoarthritis 07/11/2013  . Mild cognitive impairment 06/28/2013  . BPH (benign prostatic hyperplasia) 01/24/2013  . Depression 01/24/2013  . Hyperlipidemia 01/24/2013  . Hypertension 01/24/2013  . Hearing loss 09/02/2012  . 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 APPENDECTOMY      Prior to Admission medications   Medication Sig Start Date End Date Taking? Authorizing Provider  acetaminophen (TYLENOL) 500 MG tablet Take 500 mg by mouth every 6 (six) hours as needed.    [provider]  amoxicillin (AMOXIL) 500 MG tablet Take 1 tablet (500 mg total) by mouth 2 (two) times daily. 08/17/20   Sondra Come, MD  aspirin EC 81 MG tablet Take 81 mg by mouth daily. Swallow whole.    [provider]  atorvastatin (LIPITOR) 20 MG tablet Take 20 mg by mouth daily.    [provider]  clonazePAM (KLONOPIN) 0.5 MG tablet Take 0.5 mg by mouth 2 (two) times daily as needed for anxiety.    [provider]  diclofenac Sodium (VOLTAREN) 1 % GEL Apply topically 4 (four) times daily.    [provider]  DULoxetine (CYMBALTA) 60 MG capsule Take 60 mg by mouth daily.    [provider]  fluticasone (FLONASE) 50 MCG/ACT nasal spray Place into both nostrils daily.    [provider]  gabapentin (NEURONTIN) 300 MG capsule Take 300 mg by mouth 3 (three) times daily.    [provider]  gabapentin (NEURONTIN) 600 MG tablet Take 600 mg by mouth 3 (three) times daily.    [provider]  hydrocortisone cream 0.5 % Apply 1 application topically 2 (two) times daily.    [provider]  hydrOXYzine (ATARAX/VISTARIL) 50 MG tablet Take 50 mg by mouth 3 (three) times daily as needed.  [provider]  lisinopril (ZESTRIL) 10 MG tablet Take 10 mg by mouth daily.    [provider]  polyethylene glycol (MIRALAX / GLYCOLAX) 17 g packet Take 17 g by mouth daily.    [provider]  senna (SENOKOT) 8.6 MG tablet Take 1 tablet by mouth daily.    [provider]  tamsulosin (FLOMAX) 0.4 MG CAPS capsule Take 1 capsule (0.4 mg total) by mouth daily. 09/11/20   Sondra ComeSninsky, Brian C, MD  traZODone (DESYREL) 50 MG tablet Take  50 mg by mouth at bedtime.    [provider]    Allergies Patient has no known allergies.  Family History  Family history unknown: Yes    Social History Social History   Tobacco Use  . Smoking status: Never Smoker  . Smokeless tobacco: Never Used     Review of Systems  Constitutional: No fever/chills Eyes:  No discharge ENT: No upper respiratory complaints. Respiratory: no cough. No SOB/ use of accessory muscles to breath Gastrointestinal:   No nausea, no vomiting.  No diarrhea. No constipation. Musculoskeletal: Patient has upper back pain.  Skin: Negative for rash, abrasions, lacerations, ecchymosis.    ____________________________________________   PHYSICAL EXAM:  VITAL SIGNS: ED Triage Vitals  Enc Vitals Group     BP 01/16/21 1604 (!) 148/95     Pulse Rate 01/16/21 1604 (!) 59     Resp 01/16/21 1604 18     Temp 01/16/21 1604 98.7 F (37.1 C)     Temp Source 01/16/21 1604 Oral     SpO2 01/16/21 1604 100 %     Weight 01/16/21 1602 199 lb 15.3 oz (90.7 kg)     Height 01/16/21 1602 5\' 7"  (1.702 m)     Head Circumference --      Peak Flow --      Pain Score 01/16/21 1601 6     Pain Loc --      Pain Edu? --      Excl. in GC? --      Constitutional: Alert and oriented. Well appearing and in no acute distress. Eyes: Conjunctivae are normal. PERRL. EOMI. Head: Atraumatic. ENT:      Ears: TMs are pearly.       Nose: No congestion/rhinnorhea.      Mouth/Throat: Mucous membranes are moist.  Neck: No stridor.  No cervical spine tenderness to palpation. Cardiovascular: Normal rate, regular rhythm. Normal S1 and S2.  Good peripheral circulation. Respiratory: Normal respiratory effort without tachypnea or retractions. Lungs CTAB. Good air entry to the bases with no decreased or absent breath sounds Gastrointestinal: Bowel sounds x 4 quadrants. Soft and nontender to palpation. No guarding or rigidity. No distention. Musculoskeletal: Full range of motion  to all extremities. No obvious deformities noted.  Patient has some paraspinal muscle tenderness along the thoracic spine. Neurologic:  Normal for age. No gross focal neurologic deficits are appreciated.  Skin:  Skin is warm, dry and intact. No rash noted. Psychiatric: Mood and affect are normal for age. Speech and behavior are normal.   ____________________________________________   LABS (all labs ordered are listed, but only abnormal results are displayed)  Labs Reviewed - No data to display ____________________________________________  EKG   ____________________________________________  RADIOLOGY Geraldo PitterI, Darleene Cumpian M Starsky Nanna, personally viewed and evaluated these images (plain radiographs) as part of my medical decision making, as well as reviewing the written report by the radiologist.  DG Thoracic Spine 2 View  Result Date: 01/16/2021 CLINICAL DATA:  Unwitnessed trip and fall.  Mid back pain. EXAM: THORACIC SPINE 2 VIEWS COMPARISON:  None. FINDINGS: Minimal broad-based dextroscoliotic curvature. Vertebral body heights are maintained. No evidence of fracture. Mild disc space narrowing in the midthoracic spine. Posterior elements appear intact. There is no paravertebral soft tissue abnormality. IMPRESSION: No fracture of the thoracic spine. Electronically Signed   By: Narda Rutherford M.D.   On: 01/16/2021 18:49   CT Head Wo Contrast  Result Date: 01/16/2021 CLINICAL DATA:  Fall tripped over carpet EXAM: CT HEAD WITHOUT CONTRAST TECHNIQUE: Contiguous axial images were obtained from the base of the skull through the vertex without intravenous contrast. COMPARISON:  April 12, 2020 FINDINGS: Brain: No evidence of acute territorial infarction, hemorrhage, hydrocephalus,extra-axial collection or mass lesion/mass effect. There is dilatation the ventricles and sulci consistent with age-related atrophy. Low-attenuation changes in the deep white matter consistent with small vessel ischemia. Area of  encephalomalacia involving the right parietooccipital lobe. Vascular: No hyperdense vessel or unexpected calcification. Skull: The skull is intact. No fracture or focal lesion identified. Sinuses/Orbits: The visualized paranasal sinuses and mastoid air cells are clear. The orbits and globes intact. Other: None Cervical spine: Alignment: There is straightening of the normal cervical lordosis. Skull base and vertebrae: Visualized skull base is intact. No atlanto-occipital dissociation. The vertebral body heights are well maintained. No fracture or pathologic osseous lesion seen. Soft tissues and spinal canal: The visualized paraspinal soft tissues are unremarkable. No prevertebral soft tissue swelling is seen. The spinal canal is grossly unremarkable, no large epidural collection or significant canal narrowing. Disc levels: Multilevel cervical spine spondylosis seen with disc osteophyte complex and uncovertebral osteophytes most notable at C4-C5 and C5-C6 with severe bilateral neural foraminal narrowing and moderate central canal stenosis. Upper chest: The lung apices are clear. Thoracic inlet is within normal limits. Other: None IMPRESSION: No acute intracranial abnormality. Findings consistent with age related atrophy and chronic small vessel ischemia Area of encephalomalacia involving the right parieto-occipital lobe No acute fracture or malalignment of the spine. Electronically Signed   By: Jonna Clark M.D.   On: 01/16/2021 16:44   CT Cervical Spine Wo Contrast  Result Date: 01/16/2021 CLINICAL DATA:  Fall tripped over carpet EXAM: CT HEAD WITHOUT CONTRAST TECHNIQUE: Contiguous axial images were obtained from the base of the skull through the vertex without intravenous contrast. COMPARISON:  April 12, 2020 FINDINGS: Brain: No evidence of acute territorial infarction, hemorrhage, hydrocephalus,extra-axial collection or mass lesion/mass effect. There is dilatation the ventricles and sulci consistent with  age-related atrophy. Low-attenuation changes in the deep white matter consistent with small vessel ischemia. Area of encephalomalacia involving the right parietooccipital lobe. Vascular: No hyperdense vessel or unexpected calcification. Skull: The skull is intact. No fracture or focal lesion identified. Sinuses/Orbits: The visualized paranasal sinuses and mastoid air cells are clear. The orbits and globes intact. Other: None Cervical spine: Alignment: There is straightening of the normal cervical lordosis. Skull base and vertebrae: Visualized skull base is intact. No atlanto-occipital dissociation. The vertebral body heights are well maintained. No fracture or pathologic osseous lesion seen. Soft tissues and spinal canal: The visualized paraspinal soft tissues are unremarkable. No prevertebral soft tissue swelling is seen. The spinal canal is grossly unremarkable, no large epidural collection or significant canal narrowing. Disc levels: Multilevel cervical spine spondylosis seen with disc osteophyte complex and uncovertebral osteophytes most notable at C4-C5 and C5-C6 with severe bilateral neural foraminal narrowing and moderate central canal stenosis. Upper chest: The lung apices are clear. Thoracic inlet is  within normal limits. Other: None IMPRESSION: No acute intracranial abnormality. Findings consistent with age related atrophy and chronic small vessel ischemia Area of encephalomalacia involving the right parieto-occipital lobe No acute fracture or malalignment of the spine. Electronically Signed   By: Jonna Clark M.D.   On: 01/16/2021 16:44    ____________________________________________    PROCEDURES  Procedure(s) performed:     Procedures     Medications  acetaminophen (TYLENOL) tablet 650 mg (650 mg Oral Given 01/16/21 1826)     ____________________________________________   INITIAL IMPRESSION / ASSESSMENT AND PLAN / ED COURSE  Pertinent labs & imaging results that were available  during my care of the patient were reviewed by me and considered in my medical decision making (see chart for details).      Assessment and plan Fall 75 year old male presents to the emergency department after he had a mechanical fall earlier in the day.  Patient was mildly bradycardic but vital signs were otherwise reassuring.  Patient was alert and nontoxic-appearing.  He does have a baseline left-sided facial droop.  Patient was able to provide historical information.  He endorsed some upper back discomfort and headache.  X-rays of the thoracic spine showed no acute bony abnormality.  CTs of the head and cervical spine showed no signs of intracranial bleed, skull fracture or C-spine fracture.  Patient was advised to take Tylenol as needed for discomfort.  All patient questions were answered.     ____________________________________________  FINAL CLINICAL IMPRESSION(S) / ED DIAGNOSES  Final diagnoses:  Fall, initial encounter      NEW MEDICATIONS STARTED DURING THIS VISIT:  ED Discharge Orders    None          This chart was dictated using voice recognition software/Dragon. Despite best efforts to proofread, errors can occur which can change the meaning. Any change was purely unintentional.     Orvil Feil, PA-C 01/16/21 1941    Merwyn Katos, MD 01/16/21 2350

## 2021-01-16 NOTE — Discharge Instructions (Signed)
Continue taking Tylenol as needed for discomfort

## 2021-01-16 NOTE — ED Triage Notes (Signed)
Pt comes into the ED via [facility] from [facility] c/o mechanical fall where he tripped over something in the carpet.  The fall was unwitnessed.  Pt admits to hitting his head and back.  Pt also c/o dizziness.  A&Ox4.  Denies any blood thinners.

## 2021-01-16 NOTE — ED Notes (Addendum)
Phone call to [facility] to let them know Mr. Bursch is discharged and in need of transportation home; staff reports someone will be here to pick him up "in awhile"

## 2021-02-19 ENCOUNTER — Telehealth: Payer: Self-pay | Admitting: *Deleted

## 2021-02-19 NOTE — Telephone Encounter (Signed)
Nurse from the [facility] called in today and states she got a message/ref. stating that the patient needs an appt here for uti. Talked with the nurse and let her know that he was a patient of ours. Asked the nurse if he was having any pain or symptoms. She states she dont know. I advised her to find out and call us back and we can make an appt for him to come in and be seen.

## 2021-03-07 ENCOUNTER — Other Ambulatory Visit: Payer: Self-pay

## 2021-03-07 ENCOUNTER — Emergency Department
Admission: EM | Admit: 2021-03-07 | Discharge: 2021-03-07 | Disposition: A | Discharge status: Home / Self Care | Payer: Medicare Other | Attending: Emergency Medicine | Admitting: Emergency Medicine

## 2021-03-07 ENCOUNTER — Encounter: Payer: Self-pay | Admitting: Emergency Medicine

## 2021-03-07 DIAGNOSIS — I10 Essential (primary) hypertension: Secondary | ICD-10-CM | POA: Diagnosis not present

## 2021-03-07 DIAGNOSIS — Z79899 Other long term (current) drug therapy: Secondary | ICD-10-CM | POA: Diagnosis not present

## 2021-03-07 DIAGNOSIS — N390 Urinary tract infection, site not specified: Secondary | ICD-10-CM | POA: Insufficient documentation

## 2021-03-07 DIAGNOSIS — R300 Dysuria: Secondary | ICD-10-CM | POA: Diagnosis present

## 2021-03-07 DIAGNOSIS — Z7982 Long term (current) use of aspirin: Secondary | ICD-10-CM | POA: Insufficient documentation

## 2021-03-07 LAB — URINALYSIS, COMPLETE (UACMP) WITH MICROSCOPIC
Bacteria, UA: NONE SEEN
Bilirubin Urine: NEGATIVE
Glucose, UA: NEGATIVE mg/dL
Hgb urine dipstick: NEGATIVE
Ketones, ur: NEGATIVE mg/dL
Nitrite: NEGATIVE
Protein, ur: NEGATIVE mg/dL
Specific Gravity, Urine: 1.016 (ref 1.005–1.030)
WBC, UA: >50 WBC/hpf — ABNORMAL HIGH (ref 0–5)
pH: 6 (ref 5.0–8.0)

## 2021-03-07 LAB — COMPREHENSIVE METABOLIC PANEL
ALT: 17 U/L (ref 0–44)
AST: 20 U/L (ref 15–41)
Albumin: 4 g/dL (ref 3.5–5.0)
Alkaline Phosphatase: 146 U/L — ABNORMAL HIGH (ref 38–126)
Anion gap: 8 (ref 5–15)
BUN: 11 mg/dL (ref 8–23)
CO2: 26 mmol/L (ref 22–32)
Calcium: 9.2 mg/dL (ref 8.9–10.3)
Chloride: 107 mmol/L (ref 98–111)
Creatinine, Ser: 1 mg/dL (ref 0.61–1.24)
GFR, Estimated: >60 mL/min (ref >60)
Glucose, Bld: 104 mg/dL — ABNORMAL HIGH (ref 70–99)
Potassium: 4 mmol/L (ref 3.5–5.1)
Sodium: 141 mmol/L (ref 135–145)
Total Bilirubin: 0.7 mg/dL (ref 0.3–1.2)
Total Protein: 6.8 g/dL (ref 6.5–8.1)

## 2021-03-07 LAB — CBC WITH DIFFERENTIAL/PLATELET
Abs Immature Granulocytes: 0.01 10*3/uL (ref 0.00–0.07)
Basophils Absolute: 0.1 10*3/uL (ref 0.0–0.1)
Basophils Relative: 1 %
Eosinophils Absolute: 0.6 10*3/uL — ABNORMAL HIGH (ref 0.0–0.5)
Eosinophils Relative: 10 %
HCT: 36.2 % — ABNORMAL LOW (ref 39.0–52.0)
Hemoglobin: 12.1 g/dL — ABNORMAL LOW (ref 13.0–17.0)
Immature Granulocytes: 0 %
Lymphocytes Relative: 37 %
Lymphs Abs: 2.3 10*3/uL (ref 0.7–4.0)
MCH: 29 pg (ref 26.0–34.0)
MCHC: 33.4 g/dL (ref 30.0–36.0)
MCV: 86.8 fL (ref 80.0–100.0)
Monocytes Absolute: 0.6 10*3/uL (ref 0.1–1.0)
Monocytes Relative: 9 %
Neutro Abs: 2.6 10*3/uL (ref 1.7–7.7)
Neutrophils Relative %: 43 %
Platelets: 283 10*3/uL (ref 150–400)
RBC: 4.17 MIL/uL — ABNORMAL LOW (ref 4.22–5.81)
RDW: 12.6 % (ref 11.5–15.5)
WBC: 6.1 10*3/uL (ref 4.0–10.5)
nRBC: 0 % (ref 0.0–0.2)

## 2021-03-07 MED ORDER — SULFAMETHOXAZOLE-TRIMETHOPRIM 800-160 MG PO TABS: 1.0000 | ORAL_TABLET | Freq: Two times a day (BID) | ORAL | 0 refills | Status: AC

## 2021-03-07 MED ORDER — SULFAMETHOXAZOLE-TRIMETHOPRIM 800-160 MG PO TABS
1.0000 | ORAL_TABLET | Freq: Once | ORAL | Status: AC
  Administered 2021-03-07: 1 via ORAL
  Filled 2021-03-07: qty 1

## 2021-03-07 NOTE — ED Provider Notes (Signed)
[facility] Provider Note   ____________________________________________   Event Date/Time   First MD Initiated Contact with Patient 03/07/21 0505     (approximate)  I have reviewed the triage vital signs and the nursing notes.   HISTORY  Chief Complaint Urinary Frequency    HPI Johnny Rice is a 75 y.o. male with past medical history of hypertension, stroke, anxiety, and cognitive impairment who presents to the ED complaining of dysuria.  Patient reports that he has been dealing with persistent burning when he urinates for the past 8 weeks.  He states he has been on antibiotics previously but he does not remember the name of them.  This been associated with some discomfort in his suprapubic area as well as his right flank, he denies any fevers, nausea, or vomiting.  He states he now feels like he has trouble controlling the flow of urine and has been urinating on himself.        Past Medical History:  Diagnosis Date  . Hypertension   . Stroke [facility])     Patient Active Problem List   Diagnosis Date Noted  . 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  . Demand ischemia of myocardium (HCC) 01/16/2020  . Severe visual impairment 10/18/2019  . Alcohol use disorder, severe, 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HCC) 08/04/2019  . History of CVA (cerebrovascular accident) 06/11/2019  . 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 Frequent falls 12/19/2016  . Anxiety disorder, unspecified 03/12/2016  . Insomnia 02/11/2016  . 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 Cerebrovascular accident (CVA) (HCC) 05/04/2014  . 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  . 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 Neurocognitive disorder 09/07/2013  . Osteoarthritis 07/11/2013  . Mild cognitive impairment 06/28/2013  . BPH (benign prostatic hyperplasia) 01/24/2013  . Depression 01/24/2013  . Hyperlipidemia  01/24/2013  . Hypertension 01/24/2013  . Hearing loss 09/02/2012  . 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 APPENDECTOMY      Prior to Admission medications   Medication Sig Start Date End Date Taking? Authorizing Provider  sulfamethoxazole-trimethoprim (BACTRIM DS) 800-160 MG tablet Take 1 tablet by mouth 2 (two) times daily for 7 days. 03/07/21 03/14/21 Yes Chesley Noon, MD  acetaminophen (TYLENOL) 500 MG tablet Take 500 mg by mouth every 6 (six) hours as needed.    [provider]  amoxicillin (AMOXIL) 500 MG tablet Take 1 tablet (500 mg total) by mouth 2 (two) times daily. 08/17/20   Sondra Come, MD  aspirin EC 81 MG tablet Take 81 mg by mouth daily. Swallow whole.    [provider]  atorvastatin (LIPITOR) 20 MG tablet Take 20 mg by mouth daily.    [provider]  clonazePAM (KLONOPIN) 0.5 MG tablet Take 0.5 mg by mouth 2 (two) times daily as needed for anxiety.    [provider]  diclofenac Sodium (VOLTAREN) 1 % GEL Apply topically 4 (four) times daily.    [provider]  DULoxetine (CYMBALTA) 60 MG capsule Take 60 mg by mouth daily.    [provider]  fluticasone (FLONASE) 50 MCG/ACT nasal spray Place into both nostrils daily.    [provider]  gabapentin (NEURONTIN) 300 MG capsule Take 300 mg by mouth 3 (three) times daily.    [provider]  gabapentin (NEURONTIN) 600 MG tablet Take 600 mg by mouth 3 (three) times daily.  [provider]  hydrocortisone cream 0.5 % Apply 1 application topically 2 (two) times daily.    [provider]  hydrOXYzine (ATARAX/VISTARIL) 50 MG tablet Take 50 mg by mouth 3 (three) times daily as needed.    [provider]  lisinopril (ZESTRIL) 10 MG tablet Take 10 mg by mouth daily.    [provider]  polyethylene glycol (MIRALAX / GLYCOLAX) 17 g packet Take 17  g by mouth daily.    [provider]  senna (SENOKOT) 8.6 MG tablet Take 1 tablet by mouth daily.    [provider]  tamsulosin (FLOMAX) 0.4 MG CAPS capsule Take 1 capsule (0.4 mg total) by mouth daily. 09/11/20   Sondra Come, MD  traZODone (DESYREL) 50 MG tablet Take 50 mg by mouth at bedtime.    [provider]    Allergies Patient has no known allergies.  Family History  Family history unknown: Yes    Social History Social History   Tobacco Use  . Smoking status: Never Smoker  . Smokeless tobacco: Never Used    Review of Systems  Constitutional: No fever/chills Eyes: No visual changes. ENT: No sore throat. Cardiovascular: Denies chest pain. Respiratory: Denies shortness of breath. Gastrointestinal: No abdominal pain.  No nausea, no vomiting.  No diarrhea.  No constipation. Genitourinary: Positive for dysuria and urinary incontinence. Musculoskeletal: Negative for back pain. Skin: Negative for rash. Neurological: Negative for headaches, focal weakness or numbness.  ____________________________________________   PHYSICAL EXAM:  VITAL SIGNS: ED Triage Vitals  Enc Vitals Group     BP 03/07/21 0421 122/75     Pulse Rate 03/07/21 0421 (!) 59     Resp 03/07/21 0421 18     Temp 03/07/21 0421 98.2 F (36.8 C)     Temp Source 03/07/21 0421 Oral     SpO2 03/07/21 0421 93 %     Weight 03/07/21 0422 153 lb (69.4 kg)     Height 03/07/21 0422 5\' 7"  (1.702 m)     Head Circumference --      Peak Flow --      Pain Score 03/07/21 0422 7     Pain Loc --      Pain Edu? --      Excl. in GC? --     Constitutional: Alert and oriented. Eyes: Conjunctivae are normal. Head: Atraumatic. Nose: No congestion/rhinnorhea. Mouth/Throat: Mucous membranes are moist. Neck: Normal ROM Cardiovascular: Normal rate, regular rhythm. Grossly normal heart sounds. Respiratory: Normal respiratory effort.  No retractions. Lungs CTAB. Gastrointestinal: Soft and  nontender.  No CVA tenderness bilaterally.  No distention. Genitourinary: deferred Musculoskeletal: No lower extremity tenderness nor edema. Neurologic:  Normal speech and language. No gross focal neurologic deficits are appreciated. Skin:  Skin is warm, dry and intact. No rash noted. Psychiatric: Mood and affect are normal. Speech and behavior are normal.  ____________________________________________   LABS (all labs ordered are listed, but only abnormal results are displayed)  Labs Reviewed  URINALYSIS, COMPLETE (UACMP) WITH MICROSCOPIC - Abnormal; Notable for the following components:      Result Value   Color, Urine YELLOW (*)    APPearance CLOUDY (*)    Leukocytes,Ua LARGE (*)    WBC, UA >50 (*)    All other components within normal limits  CBC WITH DIFFERENTIAL/PLATELET - Abnormal; Notable for the following components:   RBC 4.17 (*)    Hemoglobin 12.1 (*)    HCT 36.2 (*)    Eosinophils Absolute 0.6 (*)  All other components within normal limits  COMPREHENSIVE METABOLIC PANEL - Abnormal; Notable for the following components:   Glucose, Bld 104 (*)    Alkaline Phosphatase 146 (*)    All other components within normal limits  URINE CULTURE     PROCEDURES  Procedure(s) performed (including Critical Care):  Procedures   ____________________________________________   INITIAL IMPRESSION / ASSESSMENT AND PLAN / ED COURSE       75 year old male with past medical history of hypertension, stroke, anxiety, and cognitive disability who presents to the ED complaining of 8 weeks of persistent dysuria now with suprapubic pain, right flank pain, and urinary incontinence.  He is well-appearing with reassuring vital signs, no abdominal or costovertebral area tenderness on exam.  Labs are unremarkable, renal function stable compared to previous.  UA is pending.  UA is concerning for UTI and we will send for culture.  Reviewing prior culture data, patient has grown Enterococcus  on multiple occasions, sensitive to Bactrim.  He is not sure of what other antibiotics he has been on recently and information is not available on his chart.  He was given initial dose of Bactrim and we will prescribe a 7-day course.  He was counseled to follow-up with urology given recent recurrent UTI, otherwise counseled to return to the ED for new or worsening symptoms.  Patient agrees with plan.      ____________________________________________   FINAL CLINICAL IMPRESSION(S) / ED DIAGNOSES  Final diagnoses:  Lower urinary tract infectious disease     ED Discharge Orders         Ordered    sulfamethoxazole-trimethoprim (BACTRIM DS) 800-160 MG tablet  2 times daily        03/07/21 0631           Note:  This document was prepared using Dragon voice recognition software and may include unintentional dictation errors.   Chesley Noon, MD 03/07/21 [phone]

## 2021-03-07 NOTE — ED Triage Notes (Signed)
Pt to triage via w/c,with no distress noted; EMS brought pt in from [facility]; pt reports urinary urgency/frequency and burning x 6wks; st has been eval by PCP and rx antibiotics without relief; denies any abd pain but st some rt flank pain

## 2021-03-09 LAB — URINE CULTURE: Culture: >=100000 — AB

## 2021-03-21 ENCOUNTER — Ambulatory Visit: Payer: Medicare Other | Admitting: Urology

## 2021-03-22 ENCOUNTER — Encounter: Payer: Self-pay | Admitting: Urology

## 2021-04-02 ENCOUNTER — Other Ambulatory Visit: Payer: Self-pay

## 2021-04-02 ENCOUNTER — Inpatient Hospital Stay
Admission: EM | Admit: 2021-04-02 | Discharge: 2021-04-04 | DRG: 690 | Disposition: A | Discharge status: Home Health | Payer: Medicare Other | Source: Skilled Nursing Facility | Attending: Internal Medicine | Admitting: Internal Medicine

## 2021-04-02 ENCOUNTER — Emergency Department: Payer: Medicare Other

## 2021-04-02 ENCOUNTER — Observation Stay: Payer: Medicare Other

## 2021-04-02 DIAGNOSIS — N3000 Acute cystitis without hematuria: Secondary | ICD-10-CM | POA: Diagnosis not present

## 2021-04-02 DIAGNOSIS — F0390 Unspecified dementia without behavioral disturbance: Secondary | ICD-10-CM | POA: Diagnosis present

## 2021-04-02 DIAGNOSIS — I10 Essential (primary) hypertension: Secondary | ICD-10-CM | POA: Diagnosis not present

## 2021-04-02 DIAGNOSIS — Z7982 Long term (current) use of aspirin: Secondary | ICD-10-CM

## 2021-04-02 DIAGNOSIS — I69392 Facial weakness following cerebral infarction: Secondary | ICD-10-CM

## 2021-04-02 DIAGNOSIS — N390 Urinary tract infection, site not specified: Secondary | ICD-10-CM | POA: Diagnosis not present

## 2021-04-02 DIAGNOSIS — Z79899 Other long term (current) drug therapy: Secondary | ICD-10-CM

## 2021-04-02 DIAGNOSIS — J189 Pneumonia, unspecified organism: Secondary | ICD-10-CM

## 2021-04-02 DIAGNOSIS — R296 Repeated falls: Secondary | ICD-10-CM | POA: Diagnosis present

## 2021-04-02 DIAGNOSIS — H9193 Unspecified hearing loss, bilateral: Secondary | ICD-10-CM | POA: Diagnosis present

## 2021-04-02 DIAGNOSIS — F419 Anxiety disorder, unspecified: Secondary | ICD-10-CM | POA: Diagnosis present

## 2021-04-02 DIAGNOSIS — R54 Age-related physical debility: Secondary | ICD-10-CM | POA: Diagnosis present

## 2021-04-02 DIAGNOSIS — N3090 Cystitis, unspecified without hematuria: Secondary | ICD-10-CM | POA: Diagnosis present

## 2021-04-02 DIAGNOSIS — F6081 Narcissistic personality disorder: Secondary | ICD-10-CM | POA: Diagnosis present

## 2021-04-02 DIAGNOSIS — E782 Mixed hyperlipidemia: Secondary | ICD-10-CM | POA: Diagnosis present

## 2021-04-02 DIAGNOSIS — F331 Major depressive disorder, recurrent, moderate: Secondary | ICD-10-CM

## 2021-04-02 DIAGNOSIS — N400 Enlarged prostate without lower urinary tract symptoms: Secondary | ICD-10-CM | POA: Diagnosis not present

## 2021-04-02 DIAGNOSIS — N289 Disorder of kidney and ureter, unspecified: Secondary | ICD-10-CM | POA: Diagnosis present

## 2021-04-02 DIAGNOSIS — Z20822 Contact with and (suspected) exposure to covid-19: Secondary | ICD-10-CM | POA: Diagnosis present

## 2021-04-02 DIAGNOSIS — R1310 Dysphagia, unspecified: Secondary | ICD-10-CM | POA: Diagnosis present

## 2021-04-02 DIAGNOSIS — W19XXXA Unspecified fall, initial encounter: Secondary | ICD-10-CM

## 2021-04-02 DIAGNOSIS — Z87440 Personal history of urinary (tract) infections: Secondary | ICD-10-CM

## 2021-04-02 LAB — BASIC METABOLIC PANEL
Anion gap: 5 (ref 5–15)
BUN: 10 mg/dL (ref 8–23)
CO2: 29 mmol/L (ref 22–32)
Calcium: 9.3 mg/dL (ref 8.9–10.3)
Chloride: 105 mmol/L (ref 98–111)
Creatinine, Ser: 0.94 mg/dL (ref 0.61–1.24)
GFR, Estimated: >60 mL/min (ref >60)
Glucose, Bld: 132 mg/dL — ABNORMAL HIGH (ref 70–99)
Potassium: 4.2 mmol/L (ref 3.5–5.1)
Sodium: 139 mmol/L (ref 135–145)

## 2021-04-02 LAB — URINALYSIS, COMPLETE (UACMP) WITH MICROSCOPIC
Bilirubin Urine: NEGATIVE
Glucose, UA: NEGATIVE mg/dL
Hgb urine dipstick: NEGATIVE
Ketones, ur: NEGATIVE mg/dL
Nitrite: NEGATIVE
Protein, ur: NEGATIVE mg/dL
Specific Gravity, Urine: 1.012 (ref 1.005–1.030)
Squamous Epithelial / HPF: NONE SEEN (ref 0–5)
WBC, UA: >50 WBC/hpf — ABNORMAL HIGH (ref 0–5)
pH: 6 (ref 5.0–8.0)

## 2021-04-02 LAB — HEPATIC FUNCTION PANEL
ALT: 15 U/L (ref 0–44)
AST: 22 U/L (ref 15–41)
Albumin: 4.1 g/dL (ref 3.5–5.0)
Alkaline Phosphatase: 143 U/L — ABNORMAL HIGH (ref 38–126)
Bilirubin, Direct: <0.1 mg/dL (ref 0.0–0.2)
Total Bilirubin: 0.7 mg/dL (ref 0.3–1.2)
Total Protein: 6.9 g/dL (ref 6.5–8.1)

## 2021-04-02 LAB — RESP PANEL BY RT-PCR (FLU A&B, COVID) ARPGX2
Influenza A by PCR: NEGATIVE
Influenza B by PCR: NEGATIVE
SARS Coronavirus 2 by RT PCR: NEGATIVE

## 2021-04-02 LAB — CBC
HCT: 37.3 % — ABNORMAL LOW (ref 39.0–52.0)
Hemoglobin: 12.8 g/dL — ABNORMAL LOW (ref 13.0–17.0)
MCH: 29.5 pg (ref 26.0–34.0)
MCHC: 34.3 g/dL (ref 30.0–36.0)
MCV: 85.9 fL (ref 80.0–100.0)
Platelets: 275 10*3/uL (ref 150–400)
RBC: 4.34 MIL/uL (ref 4.22–5.81)
RDW: 12.7 % (ref 11.5–15.5)
WBC: 6.9 10*3/uL (ref 4.0–10.5)
nRBC: 0 % (ref 0.0–0.2)

## 2021-04-02 LAB — TROPONIN I (HIGH SENSITIVITY): Troponin I (High Sensitivity): 3 ng/L (ref <18)

## 2021-04-02 MED ORDER — SODIUM CHLORIDE 0.9 % IV SOLN: INTRAVENOUS | Status: DC

## 2021-04-02 MED ORDER — POLYVINYL ALCOHOL 1.4 % OP SOLN
2.0000 [drp] | Freq: Every day | OPHTHALMIC | Status: DC
  Administered 2021-04-03 – 2021-04-04 (×2): 2 [drp] via OPHTHALMIC
  Filled 2021-04-02: qty 15

## 2021-04-02 MED ORDER — ENOXAPARIN SODIUM 40 MG/0.4ML IJ SOSY
40.0000 mg | PREFILLED_SYRINGE | INTRAMUSCULAR | Status: DC
  Administered 2021-04-02 – 2021-04-03 (×2): 40 mg via SUBCUTANEOUS
  Filled 2021-04-02 (×2): qty 0.4

## 2021-04-02 MED ORDER — HYDRALAZINE HCL 20 MG/ML IJ SOLN: 10.0000 mg | Freq: Four times a day (QID) | INTRAMUSCULAR | Status: DC | PRN

## 2021-04-02 MED ORDER — SODIUM CHLORIDE 0.9 % IV BOLUS
1000.0000 mL | Freq: Once | INTRAVENOUS | Status: AC
  Administered 2021-04-02: 1000 mL via INTRAVENOUS

## 2021-04-02 MED ORDER — TRAZODONE HCL 50 MG PO TABS
50.0000 mg | ORAL_TABLET | Freq: Every day | ORAL | Status: DC
  Administered 2021-04-02 – 2021-04-03 (×2): 50 mg via ORAL
  Filled 2021-04-02 (×2): qty 1

## 2021-04-02 MED ORDER — ASPIRIN 81 MG PO CHEW
81.0000 mg | CHEWABLE_TABLET | Freq: Every day | ORAL | Status: DC
  Administered 2021-04-03 – 2021-04-04 (×2): 81 mg via ORAL
  Filled 2021-04-02 (×2): qty 1

## 2021-04-02 MED ORDER — POLYETHYLENE GLYCOL 3350 17 G PO PACK: 17.0000 g | PACK | Freq: Every day | ORAL | Status: DC | PRN

## 2021-04-02 MED ORDER — FLUTICASONE PROPIONATE 50 MCG/ACT NA SUSP
2.0000 | Freq: Every day | NASAL | Status: DC
  Administered 2021-04-03 – 2021-04-04 (×2): 2 via NASAL
  Filled 2021-04-02: qty 16

## 2021-04-02 MED ORDER — LISINOPRIL 20 MG PO TABS
40.0000 mg | ORAL_TABLET | Freq: Every day | ORAL | Status: DC
  Administered 2021-04-03 – 2021-04-04 (×2): 40 mg via ORAL
  Filled 2021-04-02 (×2): qty 2

## 2021-04-02 MED ORDER — TAMSULOSIN HCL 0.4 MG PO CAPS
0.4000 mg | ORAL_CAPSULE | Freq: Every day | ORAL | Status: DC
  Administered 2021-04-03 – 2021-04-04 (×2): 0.4 mg via ORAL
  Filled 2021-04-02 (×2): qty 1

## 2021-04-02 MED ORDER — DULOXETINE HCL 30 MG PO CPEP
60.0000 mg | ORAL_CAPSULE | Freq: Every day | ORAL | Status: DC
  Administered 2021-04-03 – 2021-04-04 (×2): 60 mg via ORAL
  Filled 2021-04-02 (×2): qty 2

## 2021-04-02 MED ORDER — CLONAZEPAM 0.5 MG PO TABS
0.5000 mg | ORAL_TABLET | Freq: Once | ORAL | Status: AC
  Administered 2021-04-02: 0.5 mg via ORAL
  Filled 2021-04-02: qty 1

## 2021-04-02 MED ORDER — ARIPIPRAZOLE 5 MG PO TABS: 5.0000 mg | ORAL_TABLET | Freq: Every day | ORAL | 1 refills | Status: DC

## 2021-04-02 MED ORDER — CLONAZEPAM 0.5 MG PO TABS
0.5000 mg | ORAL_TABLET | Freq: Two times a day (BID) | ORAL | Status: DC | PRN
  Administered 2021-04-02 – 2021-04-03 (×2): 0.5 mg via ORAL
  Filled 2021-04-02 (×2): qty 1

## 2021-04-02 MED ORDER — SODIUM CHLORIDE 0.9 % IV SOLN
1.0000 g | Freq: Once | INTRAVENOUS | Status: AC
  Administered 2021-04-02: 1 g via INTRAVENOUS
  Filled 2021-04-02 (×2): qty 10

## 2021-04-02 MED ORDER — AMLODIPINE BESYLATE 10 MG PO TABS
10.0000 mg | ORAL_TABLET | Freq: Every day | ORAL | Status: DC
  Administered 2021-04-03 – 2021-04-04 (×2): 10 mg via ORAL
  Filled 2021-04-02 (×2): qty 1

## 2021-04-02 MED ORDER — ACETAMINOPHEN 325 MG PO TABS
650.0000 mg | ORAL_TABLET | Freq: Four times a day (QID) | ORAL | Status: DC | PRN
  Administered 2021-04-03 – 2021-04-04 (×2): 650 mg via ORAL
  Filled 2021-04-02 (×2): qty 2

## 2021-04-02 MED ORDER — ONDANSETRON HCL 4 MG/2ML IJ SOLN: 4.0000 mg | Freq: Four times a day (QID) | INTRAMUSCULAR | Status: DC | PRN

## 2021-04-02 MED ORDER — SODIUM CHLORIDE 0.9 % IV SOLN
1.0000 g | INTRAVENOUS | Status: DC
  Administered 2021-04-03: 1 g via INTRAVENOUS
  Filled 2021-04-02: qty 1
  Filled 2021-04-02: qty 10

## 2021-04-02 MED ORDER — ATORVASTATIN CALCIUM 20 MG PO TABS
20.0000 mg | ORAL_TABLET | Freq: Every day | ORAL | Status: DC
  Administered 2021-04-03 – 2021-04-04 (×2): 20 mg via ORAL
  Filled 2021-04-02 (×2): qty 1

## 2021-04-02 MED ORDER — ACETAMINOPHEN 650 MG RE SUPP: 650.0000 mg | Freq: Four times a day (QID) | RECTAL | Status: DC | PRN

## 2021-04-02 MED ORDER — GABAPENTIN 300 MG PO CAPS
300.0000 mg | ORAL_CAPSULE | Freq: Three times a day (TID) | ORAL | Status: DC
  Administered 2021-04-02 – 2021-04-04 (×5): 300 mg via ORAL
  Filled 2021-04-02 (×5): qty 1

## 2021-04-02 MED ORDER — ONDANSETRON HCL 4 MG PO TABS: 4.0000 mg | ORAL_TABLET | Freq: Four times a day (QID) | ORAL | Status: DC | PRN

## 2021-04-02 NOTE — H&P (Addendum)
History and Physical    Johnny Rice YQM:[id number] DOB: Aug 06, 1946 DOA: 04/02/2021  PCP: Almetta Lovely, [facility]  Patient coming from: [facility]   Chief Complaint:  Chief Complaint  Patient presents with   Fall   Urinary Incontinence     HPI: 75 year old male with past history of previous CVA with chronic left facial droop, hypertension, major depressive disorder, benign prostatic hyperplasia, hyperlipidemia who presents to [facility] emergency department from his assisted Facility status post fall via EMS.  Patient explains that for the past several days he has been developing increasing generalized weakness.  Generalized weakness and severe in intensity, worse with any exertion and improved with rest.  This is associated with frequent falls.  Patient denies any associated episodes of loss of consciousness.  Patient is also complaining of associated poor appetite over the span of time.  Patient denies fevers, sick contacts, abdominal pain, recent travel, nausea, vomiting or diarrhea.  Due to progressively worsening weakness the patient was eventually brought into [facility] emergency department via EMS for further evaluation.  Upon evaluation in the emergency department urinalysis was performed and was found to be suggestive of a urinary tract infection.  Patient was initiated on intravenous ceftriaxone.  Patient was also noted to be quite depressed and therefore Dr. Vickey Sages with psychiatry was consulted to evaluate patient and patient's psychotropic regimen was adjusted.  The hospitalist group was then called to assess the patient for admission to the hospital.  Review of Systems:   Review of Systems  Neurological:  Positive for weakness.  Psychiatric/Behavioral:  Positive for depression.   All other systems reviewed and are negative.  Past Medical History:  Diagnosis Date   Hypertension    Stroke [facility])     Past Surgical History:  Procedure Laterality Date   APPENDECTOMY        reports that he has never smoked. He has never used smokeless tobacco. He reports previous alcohol use. He reports previous drug use.  No Known Allergies  Family History  Problem Relation Age of Onset   Heart disease Neg Hx      Prior to Admission medications   Medication Sig Start Date End Date Taking? Authorizing Provider  amLODipine (NORVASC) 10 MG tablet Take 1 tablet by mouth daily. 03/12/21  Yes [provider]  ARIPiprazole (ABILIFY) 5 MG tablet Take 1 tablet (5 mg total) by mouth daily. 04/02/21  Yes Clapacs, Jackquline Denmark, MD  ARTIFICIAL TEARS 1.4 % ophthalmic solution Place 2 drops into both eyes daily. 03/18/21  Yes [provider]  ASPIRIN LOW DOSE 81 MG chewable tablet Chew 81 mg by mouth daily. 03/12/21  Yes [provider]  atorvastatin (LIPITOR) 20 MG tablet Take 20 mg by mouth daily. 02/23/20  Yes [provider]  clonazePAM (KLONOPIN) 0.5 MG tablet Take 0.5 mg by mouth 2 (two) times daily as needed for anxiety.   Yes [provider]  DULoxetine (CYMBALTA) 60 MG capsule Take 60 mg by mouth daily.   Yes [provider]  fluticasone (FLONASE) 50 MCG/ACT nasal spray Place into both nostrils daily.   Yes [provider]  gabapentin (NEURONTIN) 300 MG capsule Take 300 mg by mouth 3 (three) times daily.   Yes [provider]  hydrocortisone cream 0.5 % Apply 1 application topically 2 (two) times daily.   Yes [provider]  lisinopril (ZESTRIL) 40 MG tablet Take 40 mg by mouth daily.   Yes [provider]  acetaminophen (TYLENOL) 500 MG tablet  Take 500 mg by mouth every 6 (six) hours as needed. Patient not taking: Reported on 04/02/2021    [provider]  amoxicillin (AMOXIL) 500 MG tablet Take 1 tablet (500 mg total) by mouth 2 (two) times daily. Patient not taking: Reported on 04/02/2021 08/17/20   Sondra Come, MD  aspirin EC 81 MG tablet Take 81 mg by mouth daily. Swallow  whole. Patient not taking: Reported on 04/02/2021    [provider]  diclofenac Sodium (VOLTAREN) 1 % GEL Apply topically 4 (four) times daily. Patient not taking: Reported on 04/02/2021    [provider]  gabapentin (NEURONTIN) 600 MG tablet Take 600 mg by mouth 3 (three) times daily. Patient not taking: Reported on 04/02/2021    [provider]  hydrOXYzine (ATARAX/VISTARIL) 50 MG tablet Take 50 mg by mouth 3 (three) times daily as needed. Patient not taking: Reported on 04/02/2021    [provider]  polyethylene glycol (MIRALAX / GLYCOLAX) 17 g packet Take 17 g by mouth daily. Patient not taking: Reported on 04/02/2021    [provider]  senna (SENOKOT) 8.6 MG tablet Take 1 tablet by mouth daily. Patient not taking: Reported on 04/02/2021    [provider]  tamsulosin (FLOMAX) 0.4 MG CAPS capsule Take 1 capsule (0.4 mg total) by mouth daily. Patient not taking: Reported on 04/02/2021 09/11/20   Sondra Come, MD  traZODone (DESYREL) 50 MG tablet Take 50 mg by mouth at bedtime. Patient not taking: Reported on 04/02/2021    [provider]    Physical Exam: Vitals:   04/02/21 1630 04/02/21 1701 04/02/21 1730 04/02/21 1920  BP: (!) 127/91  (!) 143/81   Pulse:   65   Resp: 16 14 14    Temp:      TempSrc:      SpO2:   96% 97%  Weight:      Height:        Constitutional: Awake alert and oriented x3, no associated distress.   Skin: no rashes, no lesions, poor skin turgor noted. Eyes: Pupils are equally reactive to light.  No evidence of scleral icterus or conjunctival pallor.  ENMT: Dry mucous membranes noted.  Posterior pharynx clear of any exudate or lesions.   Neck: normal, supple, no masses, no thyromegaly.  No evidence of jugular venous distension.   Respiratory: clear to auscultation bilaterally, no wheezing, no crackles. Normal respiratory effort. No accessory muscle use.  Cardiovascular: Regular rate and rhythm, no  murmurs / rubs / gallops. No extremity edema. 2+ pedal pulses. No carotid bruits.  Chest:   Nontender without crepitus or deformity.   Back:   Nontender without crepitus or deformity. Abdomen: Abdomen is soft and nontender.  No evidence of intra-abdominal masses.  Positive bowel sounds noted in all quadrants.   Musculoskeletal: No joint deformity upper and lower extremities. Good ROM, no contractures. Normal muscle tone.  Neurologic: Chronic left facial droop. Is not registering CN 2-12 grossly intact. Sensation intact.  Patient moving all 4 extremities spontaneously.  Patient is following all commands.  Patient is responsive to verbal stimuli.   Psychiatric: Patient exhibits depressed mood with appropriate affect.  Patient seems to possess insight as to their current situation.     Labs on Admission: I have personally reviewed following labs and imaging studies -   CBC: Recent Labs  Lab 04/02/21 1016  WBC 6.9  HGB 12.8*  HCT 37.3*  MCV 85.9  PLT 275   Basic Metabolic  Panel: Recent Labs  Lab 04/02/21 1016  NA 139  K 4.2  CL 105  CO2 29  GLUCOSE 132*  BUN 10  CREATININE 0.94  CALCIUM 9.3   GFR: Estimated Creatinine Clearance: 76.3 mL/min (by C-G formula based on SCr of 0.94 mg/dL). Liver Function Tests: Recent Labs  Lab 04/02/21 1016  AST 22  ALT 15  ALKPHOS 143*  BILITOT 0.7  PROT 6.9  ALBUMIN 4.1   No results for input(s): LIPASE, AMYLASE in the last 168 hours. No results for input(s): AMMONIA in the last 168 hours. Coagulation Profile: No results for input(s): INR, PROTIME in the last 168 hours. Cardiac Enzymes: No results for input(s): CKTOTAL, CKMB, CKMBINDEX, TROPONINI in the last 168 hours. BNP (last 3 results) No results for input(s): PROBNP in the last 8760 hours. HbA1C: No results for input(s): HGBA1C in the last 72 hours. CBG: No results for input(s): GLUCAP in the last 168 hours. Lipid Profile: No results for input(s): CHOL, HDL, LDLCALC, TRIG,  CHOLHDL, LDLDIRECT in the last 72 hours. Thyroid Function Tests: No results for input(s): TSH, T4TOTAL, FREET4, T3FREE, THYROIDAB in the last 72 hours. Anemia Panel: No results for input(s): VITAMINB12, FOLATE, FERRITIN, TIBC, IRON, RETICCTPCT in the last 72 hours. Urine analysis:    Component Value Date/Time   COLORURINE YELLOW (A) 04/02/2021 1459   APPEARANCEUR HAZY (A) 04/02/2021 1459   APPEARANCEUR Cloudy (A) 08/09/2020 1515   LABSPEC 1.012 04/02/2021 1459   PHURINE 6.0 04/02/2021 1459   GLUCOSEU NEGATIVE 04/02/2021 1459   HGBUR NEGATIVE 04/02/2021 1459   BILIRUBINUR NEGATIVE 04/02/2021 1459   BILIRUBINUR Negative 08/09/2020 1515   KETONESUR NEGATIVE 04/02/2021 1459   PROTEINUR NEGATIVE 04/02/2021 1459   NITRITE NEGATIVE 04/02/2021 1459   LEUKOCYTESUR LARGE (A) 04/02/2021 1459    Radiological Exams on Admission - Personally Reviewed: CT Head Wo Contrast  Result Date: 04/02/2021 CLINICAL DATA:  Mental status change, unknown cause. EXAM: CT HEAD WITHOUT CONTRAST TECHNIQUE: Contiguous axial images were obtained from the base of the skull through the vertex without intravenous contrast. COMPARISON:  Prior head CT examinations 01/16/2021 and earlier. Brain MRI 04/12/2020. FINDINGS: Brain: Redemonstrated chronic cortical/subcortical infarct within the right parietal, occipital and temporal lobes (right PCA and to a lesser degree right MCA, territories). Associated ex vacuo dilatation of the right lateral ventricle. Background mild generalized cerebral atrophy. Mild patchy and ill-defined hypoattenuation within the cerebral white matter, nonspecific but compatible with chronic small vessel ischemic disease. There is no acute intracranial hemorrhage. No acute demarcated cortical infarct. No extra-axial fluid collection. No evidence of intracranial mass. No midline shift. Vascular: No hyperdense vessel.  Atherosclerotic calcifications Skull: Normal. Negative for fracture or focal lesion.  Sinuses/Orbits: Streak artifact arising from a metallic focus in the left periorbital region limits evaluation of the left orbit. No acute finding within the right orbit. Trace mucosal thickening within the bilateral ethmoid sinuses. Other: Redemonstrated sequela of prior left mastoidectomy. IMPRESSION: No evidence of acute intracranial abnormality. Redemonstrated chronic cortical/subcortical infarct within the right parietal, occipital and temporal lobes. Stable background mild generalized cerebral atrophy and chronic small vessael ischemic disease. Electronically Signed   By: Jackey Loge DO   On: 04/02/2021 14:29   CT Renal Stone Study  Result Date: 04/02/2021 CLINICAL DATA:  Status post fall last night. EXAM: CT ABDOMEN AND PELVIS WITHOUT CONTRAST TECHNIQUE: Multidetector CT imaging of the abdomen and pelvis was performed following the standard protocol without IV contrast. COMPARISON:  None. FINDINGS: Lower chest: There is cardiomegaly. No  pleural or pericardial effusion. Lung bases are clear. Hepatobiliary: No focal liver abnormality is seen. No gallstones, gallbladder wall thickening, or biliary dilatation. Pancreas: Unremarkable. No pancreatic ductal dilatation or surrounding inflammatory changes. Spleen: Normal in size without focal abnormality. Adrenals/Urinary Tract: The adrenal glands are negative. There is a 0.7 cm nonobstructing stone in the upper pole of the left kidney. A 1.4 cm hyperdense lesion off the upper pole of the left kidney has density units of approximately 49. The kidneys are otherwise negative. Ureters and urinary bladder appear normal. Stomach/Bowel: Stomach is within normal limits. The appendix is not seen but no evidence of appendicitis is identified. No evidence of bowel wall thickening, distention, or inflammatory changes. Descending and sigmoid colon diverticulosis noted Vascular/Lymphatic: No significant vascular findings are present. No enlarged abdominal or pelvic lymph  nodes. Reproductive: Prostate is unremarkable. Other: Negative. Musculoskeletal: No acute or focal abnormality. Convex left scoliosis and degenerative disease L3-4 and L4-5 noted. IMPRESSION: No acute abnormality abdomen or pelvis. Nonspecific 1.4 cm hyperdense lesion left kidney. Nonemergent MRI with and without contrast is recommended for further evaluation. 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 Nonobstructing 0.7 cm stone left kidney. Electronically Signed   By: Drusilla Kanner M.D.   On: 04/02/2021 14:29    EKG: Personally reviewed.  Rhythm is normal sinus rhythm with heart rate of 53 bpm.  No dynamic ST segment changes appreciated.  Assessment/Plan Principal Problem:   Acute cystitis without hematuria  Patient found to have urinalysis suggestive of urinary tract infection in the setting of a several day history of poor oral intake generalized weakness and frequent falls. Presents with longstanding history of BPH is likely precipitated this CT imaging of the abdomen pelvis revealed no evidence of pyelonephritis ER provider  initiated intravenous ceftriaxone which will be continued at this time Gentle intravenous hydration Following urine and blood cultures We will de-escalate antibiotic therapy as able  Active Problems:  Frequent falls Patient reporting frequent falls over the past several days Frequent falls felt to be secondary to generalized weakness possibly secondary to urinary tract infection While patient does exhibit a chronic left facial droop due to remote CVA, there is no focal neurologic deficit on examination. CT head does not reveal acute stroke but does reveal encephalomalacia and chronic infarcts in the right parietal occipital temporal lobes. Will consider MRI brain if patient does not regain strength with treatment of underlying infection. Patient denies loss of consciousness Will monitor patient on telemetry PT evaluation Vitamin B12, folate, TSH  ordered  Dysphagia   Patient reports at least a several week history of occasional difficulty with swallowing and food occasionally "getting stuck in my chest." Chest x-ray reviewed, no evidence of aspiration Patient is not experiencing any respiratory distress with attempts to swallow SLP evaluation ordered for the morning GI can likely evaluate as an outpatient unless SLP recommends otherwise or patient exhibits aspiration event/respiratory distress while hospitalized  Left kidney lesion   Incidental finding on CT imaging of the abdomen and pelvis. Outpatient MRI imaging to further characterize   BPH (benign prostatic hyperplasia)  It is not clear why the patient is not taking his Flomax. Will resume this medication    Mixed hyperlipidemia  Continue home regimen of Lipitor    Essential hypertension  Continue home regimen of amlodipine    Moderate recurrent major depression (HCC)  Patient has been evaluated by psychiatry on the emergency department due to reporting feelings of depression. Patient denies suicidal homicidal ideation Psychiatry is recommending adding Abilify to his  current psychotropic medications   Code Status:  Full code Family Communication: Deferred   Status is: Observation  The patient remains OBS appropriate and will d/c before 2 midnights.  Dispo: The patient is from: ALF              Anticipated d/c is to: ALF              Patient currently is not medically stable to d/c.   Difficult to place patient No        Marinda ElkGeorge J Knox Cervi MD [facility] Pager [phone]  If 7PM-7AM, please contact night-coverage [email or web address] Use universal [place] password for that web site. If you do not have the password, please call the hospital operator.  04/02/2021, 7:41 PM

## 2021-04-02 NOTE — ED Notes (Signed)
Dr.Clapacs at bedside  

## 2021-04-02 NOTE — ED Notes (Signed)
Patient transported to CT 

## 2021-04-02 NOTE — ED Triage Notes (Addendum)
Pt  comes into the ED via EMS from the [facility] [place] with c/o fall last night and is having generalized aches from that, pt c/o having increased urinary urgency with incontinence pt is HOA, alert on arrival.   Pt states he is having some depression and would like to speak with someone, pt denies SI/HI

## 2021-04-02 NOTE — Consult Note (Signed)
[facility] Face-to-Face Psychiatry Consult   Reason for Consult: Consult for this 75 year old man who came to the hospital with medical complaints but then asked to speak to the psychiatrist Referring Physician: Derrill Kay Patient Identification: Johnny Rice MRN:  [id number] Principal Diagnosis: Moderate recurrent major depression (HCC) Diagnosis:  Principal Problem:   Moderate recurrent major depression (HCC)   Total Time spent with patient: 1 hour  Subjective:   Johnny Rice is a 75 y.o. male patient admitted with unhappiness with his living situation.  HPI: Patient seen chart reviewed.  75 year old man with multiple medical problems.  He asked to speak to the psychiatrist because of his ongoing problems with his mood.  Patient says his mood stays down and negative and depressed much of the time.  Feels fatigued a lot.  Sleeps much of the day as well as sleeping at night.  Denies having any suicidal thoughts or intent.  Denies any hallucinations or psychotic symptoms.  Patient attributes much of his bad mood to disliking the assisted living place where he is currently living.  Patient makes a point of telling me how intelligent sophisticated and well educated he is and how frustrating it is that [place] other [place] are uneducated and uninterested in the arts.  Patient feels like he desperately wishes he could be around people who would serve as a "audience" for his art and his music.  He is currently taking duloxetine 90 mg a day, clonazepam 1 mg 3 times a day and trazodone at night and appears to have been on those medicines chronically for at least a year with maybe some dosage changes.  Past Psychiatric History: Patient did not mention it in the interview but according to the chart he has a past history of polysubstance abuse including alcohol benzodiazepines and stimulants.  He has had 1 previous hospitalization about a year ago at [facility] when he had been overdosing on benzodiazepines.  Risk  to Self:   Risk to Others:   Prior Inpatient Therapy:   Prior Outpatient Therapy:    Past Medical History:  Past Medical History:  Diagnosis Date   Hypertension    Stroke [facility])     Past Surgical History:  Procedure Laterality Date   APPENDECTOMY     Family History:  Family History  Family history unknown: Yes   Family Psychiatric  History: Denies any Social History:  Social History   Substance and Sexual Activity  Alcohol Use Not Currently     Social History   Substance and Sexual Activity  Drug Use Not Currently    Social History   Socioeconomic History   Marital status: Divorced    Spouse name: Not on file   Number of children: Not on file   Years of education: Not on file   Highest education level: Not on file  Occupational History   Not on file  Tobacco Use   Smoking status: Never   Smokeless tobacco: Never  Substance and Sexual Activity   Alcohol use: Not Currently   Drug use: Not Currently   Sexual activity: Not on file  Other Topics Concern   Not on file  Social History Narrative   Not on file   Social Determinants of Health   Financial Resource Strain: Not on file  Food Insecurity: Not on file  Transportation Needs: Not on file  Physical Activity: Not on file  Stress: Not on file  Social Connections: Not on file   Additional Social History:  Allergies:  No Known Allergies  Labs:  Results for orders placed or performed during the hospital encounter of 04/02/21 (from the past 48 hour(s))  Basic metabolic panel     Status: Abnormal   Collection Time: 04/02/21 10:16 AM  Result Value Ref Range   Sodium 139 135 - 145 mmol/L   Potassium 4.2 3.5 - 5.1 mmol/L   Chloride 105 98 - 111 mmol/L   CO2 29 22 - 32 mmol/L   Glucose, Bld 132 (H) 70 - 99 mg/dL    Comment: Glucose reference range applies only to samples taken after fasting for at least 8 hours.   BUN 10 8 - 23 mg/dL   Creatinine, Ser 4.090.94 0.61 - 1.24 mg/dL   Calcium 9.3 8.9 - 81.110.3  mg/dL   GFR, Estimated >91>60 >47>60 mL/min    Comment: (NOTE) Calculated using the CKD-EPI Creatinine Equation (2021)    Anion gap 5 5 - 15    Comment: Performed at [facility], [address] [place], [place] [address]  CBC     Status: Abnormal   Collection Time: 04/02/21 10:16 AM  Result Value Ref Range   WBC 6.9 4.0 - 10.5 K/uL   RBC 4.34 4.22 - 5.81 MIL/uL   Hemoglobin 12.8 (L) 13.0 - 17.0 g/dL   HCT 21.337.3 (L) 08.639.0 - 57.852.0 %   MCV 85.9 80.0 - 100.0 fL   MCH 29.5 26.0 - 34.0 pg   MCHC 34.3 30.0 - 36.0 g/dL   RDW 46.912.7 62.911.5 - 52.815.5 %   Platelets 275 150 - 400 K/uL   nRBC 0.0 0.0 - 0.2 %    Comment: Performed at [facility], [address] [place], [place] [address]  Troponin I (High Sensitivity)     Status: None   Collection Time: 04/02/21 10:16 AM  Result Value Ref Range   Troponin I (High Sensitivity) 3 <18 ng/L    Comment: (NOTE) Elevated high sensitivity troponin I (hsTnI) values and significant  changes across serial measurements may suggest ACS but many other  chronic and acute conditions are known to elevate hsTnI results.  Refer to the Links section for chest pain algorithms and additional  guidance. Performed at [facility], [address] [place], [place] [address]   Hepatic function panel     Status: Abnormal   Collection Time: 04/02/21 10:16 AM  Result Value Ref Range   Total Protein 6.9 6.5 - 8.1 g/dL   Albumin 4.1 3.5 - 5.0 g/dL   AST 22 15 - 41 U/L   ALT 15 0 - 44 U/L   Alkaline Phosphatase 143 (H) 38 - 126 U/L   Total Bilirubin 0.7 0.3 - 1.2 mg/dL   Bilirubin, Direct <7.2<0.1 0.0 - 0.2 mg/dL   Indirect Bilirubin NOT CALCULATED 0.3 - 0.9 mg/dL    Comment: Performed at [facility], [address] [place], [place] [address]  Urinalysis, Complete w Microscopic Urine, Catheterized     Status: Abnormal   Collection Time: 04/02/21  2:59 PM  Result Value Ref Range   Color, Urine YELLOW (A) YELLOW   APPearance HAZY (A)  CLEAR   Specific Gravity, Urine 1.012 1.005 - 1.030   pH 6.0 5.0 - 8.0   Glucose, UA NEGATIVE NEGATIVE mg/dL   Hgb urine dipstick NEGATIVE NEGATIVE   Bilirubin Urine NEGATIVE NEGATIVE   Ketones, ur NEGATIVE NEGATIVE mg/dL   Protein, ur NEGATIVE NEGATIVE mg/dL   Nitrite NEGATIVE NEGATIVE   Leukocytes,Ua LARGE (A)  NEGATIVE   RBC / HPF 6-10 0 - 5 RBC/hpf   WBC, UA >50 (H) 0 - 5 WBC/hpf   Bacteria, UA FEW (A) NONE SEEN   Squamous Epithelial / LPF NONE SEEN 0 - 5   WBC Clumps PRESENT    Mucus PRESENT     Comment: Performed at [facility], [address] [place], [place] [address]    Current Facility-Administered Medications  Medication Dose Route Frequency Provider Last Rate Last Admin   cefTRIAXone (ROCEPHIN) 1 g in sodium chloride 0.9 % 100 mL IVPB  1 g Intravenous Once Phineas Semen, MD       Current Outpatient Medications  Medication Sig Dispense Refill   ARIPiprazole (ABILIFY) 5 MG tablet Take 1 tablet (5 mg total) by mouth daily. 30 tablet 1   acetaminophen (TYLENOL) 500 MG tablet Take 500 mg by mouth every 6 (six) hours as needed.     amoxicillin (AMOXIL) 500 MG tablet Take 1 tablet (500 mg total) by mouth 2 (two) times daily. 20 tablet 0   aspirin EC 81 MG tablet Take 81 mg by mouth daily. Swallow whole.     atorvastatin (LIPITOR) 20 MG tablet Take 20 mg by mouth daily.     clonazePAM (KLONOPIN) 0.5 MG tablet Take 0.5 mg by mouth 2 (two) times daily as needed for anxiety.     diclofenac Sodium (VOLTAREN) 1 % GEL Apply topically 4 (four) times daily.     DULoxetine (CYMBALTA) 60 MG capsule Take 60 mg by mouth daily.     fluticasone (FLONASE) 50 MCG/ACT nasal spray Place into both nostrils daily.     gabapentin (NEURONTIN) 300 MG capsule Take 300 mg by mouth 3 (three) times daily.     gabapentin (NEURONTIN) 600 MG tablet Take 600 mg by mouth 3 (three) times daily.     hydrocortisone cream 0.5 % Apply 1 application topically 2 (two) times daily.     hydrOXYzine  (ATARAX/VISTARIL) 50 MG tablet Take 50 mg by mouth 3 (three) times daily as needed.     lisinopril (ZESTRIL) 10 MG tablet Take 10 mg by mouth daily.     polyethylene glycol (MIRALAX / GLYCOLAX) 17 g packet Take 17 g by mouth daily.     senna (SENOKOT) 8.6 MG tablet Take 1 tablet by mouth daily.     tamsulosin (FLOMAX) 0.4 MG CAPS capsule Take 1 capsule (0.4 mg total) by mouth daily. 30 capsule 11   traZODone (DESYREL) 50 MG tablet Take 50 mg by mouth at bedtime.      Musculoskeletal: Strength & Muscle Tone: abnormal and decreased Gait & Station: unsteady Patient leans: N/A            Psychiatric Specialty Exam:  Presentation  General Appearance:  No data recorded Eye Contact: No data recorded Speech: No data recorded Speech Volume: No data recorded Handedness: No data recorded  Mood and Affect  Mood: No data recorded Affect: No data recorded  Thought Process  Thought Processes: No data recorded Descriptions of Associations:No data recorded Orientation:No data recorded Thought Content:No data recorded History of Schizophrenia/Schizoaffective disorder:No data recorded Duration of Psychotic Symptoms:No data recorded Hallucinations:No data recorded Ideas of Reference:No data recorded Suicidal Thoughts:No data recorded Homicidal Thoughts:No data recorded  Sensorium  Memory: No data recorded Judgment: No data recorded Insight: No data recorded  Executive Functions  Concentration: No data recorded Attention Span: No data recorded Recall: No data recorded Fund of Knowledge: No data recorded Language: No data recorded  Psychomotor Activity  Psychomotor Activity: No data recorded  Assets  Assets: No data recorded  Sleep  Sleep: No data recorded  Physical Exam: Physical Exam Constitutional:      Appearance: Normal appearance.  HENT:     Head: Normocephalic and atraumatic.     Mouth/Throat:     Pharynx: Oropharynx is clear.  Eyes:      Pupils: Pupils are equal, round, and reactive to light.  Cardiovascular:     Rate and Rhythm: Normal rate and regular rhythm.  Pulmonary:     Effort: Pulmonary effort is normal.     Breath sounds: Normal breath sounds.  Abdominal:     General: Abdomen is flat.     Palpations: Abdomen is soft.  Musculoskeletal:        General: Normal range of motion.  Skin:    General: Skin is warm and dry.  Neurological:     Mental Status: He is alert. Mental status is at baseline.     Cranial Nerves: Cranial nerve deficit present.     Sensory: Sensory deficit present.     Comments: Patient clearly has had strokes causing asymmetric facial weakness.  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  Psychiatric:        Attention and Perception: Attention normal.        Mood and Affect: Mood is depressed.        Speech: Speech is delayed.        Behavior: Behavior is slowed.        Thought Content: Thought content normal. Thought content does not include suicidal ideation.        Cognition and Memory: Cognition is impaired.        Judgment: Judgment normal.   Review of Systems  Constitutional: Negative.   HENT: Negative.    Eyes: Negative.   Respiratory: Negative.    Cardiovascular: Negative.   Gastrointestinal: Negative.   Musculoskeletal: Negative.   Skin: Negative.   Neurological: Negative.   Psychiatric/Behavioral:  Positive for depression. Negative for hallucinations, memory loss, substance abuse and suicidal ideas. The patient is nervous/anxious. The patient does not have insomnia.   Blood pressure 127/77, pulse 60, temperature (!) 97.5 F (36.4 C), temperature source Oral, resp. rate 15, height 5\' 7"  (1.702 m), weight 90.7 kg, SpO2 95 %. Body mass index is 31.32 kg/m.  Treatment Plan Summary: Medication management and Plan 75 year old man with multiple symptoms of depression.  Also seems to have some personality features which are contributing to his unhappiness due to their  inflexibility and some of his prejudices.  I am also a little concerned that the 3 mg a day of clonazepam may be making him tired and sleepy and leaving him without the energy to do things.  Not clear that he is actually seeing a mental health provider anymore may just be getting medicines refilled by primary care doctor at the [facility].  I offered him some emotional support and praised his initiative at looking into ways to move to other group homes.  We reviewed his medications.  He was actually less informed about his medicines than he thought he was.  He was under the impression he was taking Effexor which was not true.  I gave him a couple of options of ways to change his medicine and he preferred my suggestion of adding 5 mg of aripiprazole to try and improve the performance of the antidepressant.  His EKG is reviewed and his QT corrected is in  a safe enough range that this should be fine.  Side effects reviewed.  Added 5 mg of Abilify and prescription provided.  Encouraged patient to look into actually seeing a therapist and psychiatrist as well.  Does not need inpatient hospitalization.  Case reviewed with ER doctor  Disposition: No evidence of imminent risk to self or others at present.   Patient does not meet criteria for psychiatric inpatient admission. Supportive therapy provided about ongoing stressors.  Mordecai Rasmussen, MD 04/02/2021 4:51 PM

## 2021-04-02 NOTE — ED Provider Notes (Signed)
[facility] Emergency Department Provider Note  ____________________________________________   Event Date/Time   First MD Initiated Contact with Patient 04/02/21 1340     (approximate)  I have reviewed the triage vital signs and the nursing notes.   HISTORY  Chief Complaint Fall and Urinary Incontinence    HPI Johnny Rice is a 75 y.o. male with past medical history of stroke, anxiety, BPH, mild dementia, prior substance abuse, here with generalized weakness, burning with urination, and urinary frequency.  The patient states that for the last several days, he has had progressively worsening dysuria, frequency, and has become incontinent of urine.  He states he is developed some associated lower suprapubic pain, as well as occasional right flank pain.  He states that because he is now having difficulty holding his urine, this is worsened his depression and he has had increasing dysphoria.  Denies any homicidal or suicidal ideation, however.  He does state that he would feel better talking to a psychiatrist.  He also notes some chills but no known fevers.  He has had some mild nausea.  He states he has fallen twice in the last 48 hours due to generalized weakness.  He believes that he slipped while trying to get up to go to the bathroom in the middle of the night.  He reports history of UTIs and BPH.    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   Frequent  falls 12/19/2016   Anxiety disorder, unspecified 03/12/2016   Insomnia 02/11/2016   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   Cerebrovascular accident (CVA) (HCC) 05/04/2014   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 Authorizing Provider  ARIPiprazole (ABILIFY) 5 MG tablet Take 1 tablet (5 mg total) by mouth daily. 04/02/21  Yes Clapacs, Jackquline Denmark, MD  acetaminophen (TYLENOL) 500 MG tablet Take 500 mg by mouth every 6 (six) hours as needed.    [provider]  amoxicillin (AMOXIL) 500 MG tablet Take 1 tablet (500 mg total) by mouth 2 (two) times daily. 08/17/20   Sondra Come, MD  aspirin EC 81 MG tablet Take 81 mg by mouth daily. Swallow whole.    [provider]  atorvastatin (LIPITOR) 20 MG tablet Take 20 mg by mouth daily.    [provider]  clonazePAM (KLONOPIN) 0.5 MG tablet Take 0.5 mg by mouth 2 (two) times daily as needed for anxiety.    [provider]  diclofenac Sodium (VOLTAREN) 1 % GEL Apply topically 4 (four)  times daily.    [provider]  DULoxetine (CYMBALTA) 60 MG capsule Take 60 mg by mouth daily.    [provider]  fluticasone (FLONASE) 50 MCG/ACT nasal spray Place into both nostrils daily.    [provider]  gabapentin (NEURONTIN) 300 MG capsule Take 300 mg by mouth 3 (three) times daily.    [provider]  gabapentin (NEURONTIN) 600 MG tablet Take 600 mg by mouth  3 (three) times daily.    [provider]  hydrocortisone cream 0.5 % Apply 1 application topically 2 (two) times daily.    [provider]  hydrOXYzine (ATARAX/VISTARIL) 50 MG tablet Take 50 mg by mouth 3 (three) times daily as needed.    [provider]  lisinopril (ZESTRIL) 10 MG tablet Take 10 mg by mouth daily.    [provider]  polyethylene glycol (MIRALAX / GLYCOLAX) 17 g packet Take 17 g by mouth daily.    [provider]  senna (SENOKOT) 8.6 MG tablet Take 1 tablet by mouth daily.    [provider]  tamsulosin (FLOMAX) 0.4 MG CAPS capsule Take 1 capsule (0.4 mg total) by mouth daily. 09/11/20   Sondra Come, MD  traZODone (DESYREL) 50 MG tablet Take 50 mg by mouth at bedtime.    [provider]    Allergies Patient has no known allergies.  Family History  Family history unknown: Yes    Social History Social History   Tobacco Use   Smoking status: Never   Smokeless tobacco: Never  Substance Use Topics   Alcohol use: Not Currently   Drug use: Not Currently    Review of Systems  Review of Systems  Constitutional:  Positive for fatigue. Negative for chills and fever.  HENT:  Negative for sore throat.   Respiratory:  Negative for chest tightness and shortness of breath.   Cardiovascular:  Negative for chest pain.  Gastrointestinal:  Negative for abdominal pain.  Genitourinary:  Positive for dysuria, frequency and penile pain. Negative for flank pain.  Musculoskeletal:  Negative for neck pain.  Skin:  Negative for rash and wound.  Allergic/Immunologic: Negative for immunocompromised state.  Neurological:  Positive for weakness. Negative for numbness.  Hematological:  Does not bruise/bleed easily.  All other systems reviewed and are negative.   ____________________________________________  PHYSICAL EXAM:      VITAL SIGNS: ED Triage Vitals  Enc Vitals Group     BP 04/02/21 1015 128/89     Pulse  Rate 04/02/21 1015 (!) 55     Resp 04/02/21 1236 18     Temp 04/02/21 1015 97.9 F (36.6 C)     Temp Source 04/02/21 1015 Oral     SpO2 04/02/21 1015 100 %     Weight 04/02/21 1013 200 lb (90.7 kg)     Height 04/02/21 1013 5\' 7"  (1.702 m)     Head Circumference --      Peak Flow --      Pain Score 04/02/21 1012 5     Pain Loc --      Pain Edu? --      Excl. in GC? --      Physical Exam Vitals and nursing note reviewed.  Constitutional:      General: He is not in acute distress.    Appearance: He is well-developed.  HENT:     Head: Normocephalic and atraumatic.  Eyes:     Conjunctiva/sclera: Conjunctivae normal.  Cardiovascular:  Rate and Rhythm: Normal rate and regular rhythm.     Heart sounds: Normal heart sounds. No murmur heard.   No friction rub.  Pulmonary:     Effort: Pulmonary effort is normal. No respiratory distress.     Breath sounds: Normal breath sounds. No wheezing or rales.  Abdominal:     General: There is no distension.     Palpations: Abdomen is soft.     Tenderness: There is no abdominal tenderness (R CVAT, mild RUQ ttp. No peritonitis.).  Musculoskeletal:     Cervical back: Neck supple.  Skin:    General: Skin is warm.     Capillary Refill: Capillary refill takes less than 2 seconds.  Neurological:     Mental Status: He is alert and oriented to person, place, and time.     Motor: No abnormal muscle tone.      ____________________________________________   LABS (all labs ordered are listed, but only abnormal results are displayed)  Labs Reviewed  URINALYSIS, COMPLETE (UACMP) WITH MICROSCOPIC - Abnormal; Notable for the following components:      Result Value   Color, Urine YELLOW (*)    APPearance HAZY (*)    Leukocytes,Ua LARGE (*)    WBC, UA >50 (*)    Bacteria, UA FEW (*)    All other components within normal limits  BASIC METABOLIC PANEL - Abnormal; Notable for the following components:   Glucose, Bld 132 (*)    All other  components within normal limits  CBC - Abnormal; Notable for the following components:   Hemoglobin 12.8 (*)    HCT 37.3 (*)    All other components within normal limits  HEPATIC FUNCTION PANEL - Abnormal; Notable for the following components:   Alkaline Phosphatase 143 (*)    All other components within normal limits  URINE CULTURE  RESP PANEL BY RT-PCR (FLU A&B, COVID) ARPGX2  TROPONIN I (HIGH SENSITIVITY)    ____________________________________________  EKG: Normal sinus rhythm, VR 53. PR 214, QRS 109, QTc 445. No acute ST elevation or depressions. No ischemia or infarct. ________________________________________  RADIOLOGY All imaging, including plain films, CT scans, and ultrasounds, independently reviewed by me, and interpretations confirmed via formal radiology reads.  ED MD interpretation:   CT Head: NAICA CT Stone: Non specific hyperdense lesion L kidney, no acute abnormality  Official radiology report(s): CT Head Wo Contrast  Result Date: 04/02/2021 CLINICAL DATA:  Mental status change, unknown cause. EXAM: CT HEAD WITHOUT CONTRAST TECHNIQUE: Contiguous axial images were obtained from the base of the skull through the vertex without intravenous contrast. COMPARISON:  Prior head CT examinations 01/16/2021 and earlier. Brain MRI 04/12/2020. FINDINGS: Brain: Redemonstrated chronic cortical/subcortical infarct within the right parietal, occipital and temporal lobes (right PCA and to a lesser degree right MCA, territories). Associated ex vacuo dilatation of the right lateral ventricle. Background mild generalized cerebral atrophy. Mild patchy and ill-defined hypoattenuation within the cerebral white matter, nonspecific but compatible with chronic small vessel ischemic disease. There is no acute intracranial hemorrhage. No acute demarcated cortical infarct. No extra-axial fluid collection. No evidence of intracranial mass. No midline shift. Vascular: No hyperdense vessel.   Atherosclerotic calcifications Skull: Normal. Negative for fracture or focal lesion. Sinuses/Orbits: Streak artifact arising from a metallic focus in the left periorbital region limits evaluation of the left orbit. No acute finding within the right orbit. Trace mucosal thickening within the bilateral ethmoid sinuses. Other: Redemonstrated sequela of prior left mastoidectomy. IMPRESSION: No evidence of acute intracranial abnormality. Redemonstrated  chronic cortical/subcortical infarct within the right parietal, occipital and temporal lobes. Stable background mild generalized cerebral atrophy and chronic small vessael ischemic disease. Electronically Signed   By: Jackey Loge DO   On: 04/02/2021 14:29   CT Renal Stone Study  Result Date: 04/02/2021 CLINICAL DATA:  Status post fall last night. EXAM: CT ABDOMEN AND PELVIS WITHOUT CONTRAST TECHNIQUE: Multidetector CT imaging of the abdomen and pelvis was performed following the standard protocol without IV contrast. COMPARISON:  None. FINDINGS: Lower chest: There is cardiomegaly. No pleural or pericardial effusion. Lung bases are clear. Hepatobiliary: No focal liver abnormality is seen. No gallstones, gallbladder wall thickening, or biliary dilatation. Pancreas: Unremarkable. No pancreatic ductal dilatation or surrounding inflammatory changes. Spleen: Normal in size without focal abnormality. Adrenals/Urinary Tract: The adrenal glands are negative. There is a 0.7 cm nonobstructing stone in the upper pole of the left kidney. A 1.4 cm hyperdense lesion off the upper pole of the left kidney has density units of approximately 49. The kidneys are otherwise negative. Ureters and urinary bladder appear normal. Stomach/Bowel: Stomach is within normal limits. The appendix is not seen but no evidence of appendicitis is identified. No evidence of bowel wall thickening, distention, or inflammatory changes. Descending and sigmoid colon diverticulosis noted Vascular/Lymphatic: No  significant vascular findings are present. No enlarged abdominal or pelvic lymph nodes. Reproductive: Prostate is unremarkable. Other: Negative. Musculoskeletal: No acute or focal abnormality. Convex left scoliosis and degenerative disease L3-4 and L4-5 noted. IMPRESSION: No acute abnormality abdomen or pelvis. Nonspecific 1.4 cm hyperdense lesion left kidney. Nonemergent MRI with and without contrast is recommended for further evaluation. 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 Nonobstructing 0.7 cm stone left kidney. Electronically Signed   By: Drusilla Kanner M.D.   On: 04/02/2021 14:29    ____________________________________________  PROCEDURES   Procedure(s) performed (including Critical Care):  Procedures  ____________________________________________  INITIAL IMPRESSION / MDM / ASSESSMENT AND PLAN / ED COURSE  As part of my medical decision making, I reviewed the following data within the electronic MEDICAL RECORD NUMBER Nursing notes reviewed and incorporated, Old chart reviewed, Notes from prior ED visits, and [place] Controlled Substance Database       *Johnny Rice was evaluated in Emergency Department on 04/02/2021 for the symptoms described in the history of present illness. He was evaluated in the context of the global COVID-19 pandemic, which necessitated consideration that the patient might be at risk for infection with the SARS-CoV-2 virus that causes COVID-19. Institutional protocols and algorithms that pertain to the evaluation of patients at risk for COVID-19 are in a state of rapid change based on information released by regulatory bodies including the CDC and federal and state organizations. These policies and algorithms were followed during the patient's care in the ED.  Some ED evaluations and interventions may be delayed as a result of limited staffing during the pandemic.*     Medical Decision Making:  75 yo M with h/o recurrent UTIs here with weakness, falls, and urinary  frequency. UA is pending. Suspect UTI with weakness, subsequent fall. Labs show normal WBC, renal function. CT stone study obtained and is largely unremarkable. Hyperdense lesion noted in L kidney. Pain is mostly on right. No diverticulitis. Will plan to give fluids, tx. Admit if UTI given his falls, weakness. Pt also reported worsening depression, dysphoria, which could be contributing. TTS/Psych consulted. Reports passive SI and prior hospitalizations, but no active plan at this time.  ____________________________________________  FINAL CLINICAL IMPRESSION(S) / ED DIAGNOSES  Final diagnoses:  Lower urinary tract infectious disease  Fall, initial encounter     MEDICATIONS GIVEN DURING THIS VISIT:  Medications  sodium chloride 0.9 % bolus 1,000 mL ( Intravenous Restarted 04/02/21 1600)  clonazePAM (KLONOPIN) tablet 0.5 mg (0.5 mg Oral Given 04/02/21 1509)  cefTRIAXone (ROCEPHIN) 1 g in sodium chloride 0.9 % 100 mL IVPB (1 g Intravenous New Bag/Given 04/02/21 1712)     ED Discharge Orders          Ordered    ARIPiprazole (ABILIFY) 5 MG tablet  Daily        04/02/21 1602             Note:  This document was prepared using Dragon voice recognition software and may include unintentional dictation errors.   Shaune PollackIsaacs, Taniah Reinecke, MD 04/02/21 1743

## 2021-04-03 DIAGNOSIS — N400 Enlarged prostate without lower urinary tract symptoms: Secondary | ICD-10-CM | POA: Diagnosis present

## 2021-04-03 DIAGNOSIS — R1310 Dysphagia, unspecified: Secondary | ICD-10-CM | POA: Diagnosis present

## 2021-04-03 DIAGNOSIS — F331 Major depressive disorder, recurrent, moderate: Secondary | ICD-10-CM | POA: Diagnosis present

## 2021-04-03 DIAGNOSIS — Z87440 Personal history of urinary (tract) infections: Secondary | ICD-10-CM | POA: Diagnosis not present

## 2021-04-03 DIAGNOSIS — Z79899 Other long term (current) drug therapy: Secondary | ICD-10-CM | POA: Diagnosis not present

## 2021-04-03 DIAGNOSIS — F6081 Narcissistic personality disorder: Secondary | ICD-10-CM | POA: Diagnosis present

## 2021-04-03 DIAGNOSIS — I69392 Facial weakness following cerebral infarction: Secondary | ICD-10-CM | POA: Diagnosis not present

## 2021-04-03 DIAGNOSIS — Z20822 Contact with and (suspected) exposure to covid-19: Secondary | ICD-10-CM | POA: Diagnosis present

## 2021-04-03 DIAGNOSIS — I10 Essential (primary) hypertension: Secondary | ICD-10-CM | POA: Diagnosis present

## 2021-04-03 DIAGNOSIS — R54 Age-related physical debility: Secondary | ICD-10-CM | POA: Diagnosis present

## 2021-04-03 DIAGNOSIS — R296 Repeated falls: Secondary | ICD-10-CM | POA: Diagnosis present

## 2021-04-03 DIAGNOSIS — H9193 Unspecified hearing loss, bilateral: Secondary | ICD-10-CM | POA: Diagnosis present

## 2021-04-03 DIAGNOSIS — Z7982 Long term (current) use of aspirin: Secondary | ICD-10-CM | POA: Diagnosis not present

## 2021-04-03 DIAGNOSIS — N3090 Cystitis, unspecified without hematuria: Secondary | ICD-10-CM | POA: Diagnosis present

## 2021-04-03 DIAGNOSIS — F419 Anxiety disorder, unspecified: Secondary | ICD-10-CM | POA: Diagnosis present

## 2021-04-03 DIAGNOSIS — F0390 Unspecified dementia without behavioral disturbance: Secondary | ICD-10-CM | POA: Diagnosis present

## 2021-04-03 DIAGNOSIS — N390 Urinary tract infection, site not specified: Secondary | ICD-10-CM | POA: Diagnosis present

## 2021-04-03 DIAGNOSIS — N3000 Acute cystitis without hematuria: Secondary | ICD-10-CM | POA: Diagnosis present

## 2021-04-03 DIAGNOSIS — E782 Mixed hyperlipidemia: Secondary | ICD-10-CM | POA: Diagnosis present

## 2021-04-03 DIAGNOSIS — N289 Disorder of kidney and ureter, unspecified: Secondary | ICD-10-CM | POA: Diagnosis present

## 2021-04-03 LAB — COMPREHENSIVE METABOLIC PANEL
ALT: 13 U/L (ref 0–44)
AST: 14 U/L — ABNORMAL LOW (ref 15–41)
Albumin: 3.5 g/dL (ref 3.5–5.0)
Alkaline Phosphatase: 125 U/L (ref 38–126)
Anion gap: 9 (ref 5–15)
BUN: 12 mg/dL (ref 8–23)
CO2: 28 mmol/L (ref 22–32)
Calcium: 9 mg/dL (ref 8.9–10.3)
Chloride: 105 mmol/L (ref 98–111)
Creatinine, Ser: 0.76 mg/dL (ref 0.61–1.24)
GFR, Estimated: >60 mL/min (ref >60)
Glucose, Bld: 93 mg/dL (ref 70–99)
Potassium: 3.7 mmol/L (ref 3.5–5.1)
Sodium: 142 mmol/L (ref 135–145)
Total Bilirubin: 0.7 mg/dL (ref 0.3–1.2)
Total Protein: 6.3 g/dL — ABNORMAL LOW (ref 6.5–8.1)

## 2021-04-03 LAB — CBC WITH DIFFERENTIAL/PLATELET
Abs Immature Granulocytes: 0.02 10*3/uL (ref 0.00–0.07)
Basophils Absolute: 0.1 10*3/uL (ref 0.0–0.1)
Basophils Relative: 1 %
Eosinophils Absolute: 0.3 10*3/uL (ref 0.0–0.5)
Eosinophils Relative: 5 %
HCT: 34.8 % — ABNORMAL LOW (ref 39.0–52.0)
Hemoglobin: 11.9 g/dL — ABNORMAL LOW (ref 13.0–17.0)
Immature Granulocytes: 0 %
Lymphocytes Relative: 38 %
Lymphs Abs: 2.2 10*3/uL (ref 0.7–4.0)
MCH: 29.1 pg (ref 26.0–34.0)
MCHC: 34.2 g/dL (ref 30.0–36.0)
MCV: 85.1 fL (ref 80.0–100.0)
Monocytes Absolute: 0.6 10*3/uL (ref 0.1–1.0)
Monocytes Relative: 10 %
Neutro Abs: 2.7 10*3/uL (ref 1.7–7.7)
Neutrophils Relative %: 46 %
Platelets: 238 10*3/uL (ref 150–400)
RBC: 4.09 MIL/uL — ABNORMAL LOW (ref 4.22–5.81)
RDW: 12.7 % (ref 11.5–15.5)
WBC: 5.8 10*3/uL (ref 4.0–10.5)
nRBC: 0 % (ref 0.0–0.2)

## 2021-04-03 LAB — FOLATE: Folate: 7 ng/mL (ref >5.9)

## 2021-04-03 LAB — TSH: TSH: 0.236 u[IU]/mL — ABNORMAL LOW (ref 0.350–4.500)

## 2021-04-03 LAB — VITAMIN B12: Vitamin B-12: 181 pg/mL (ref 180–914)

## 2021-04-03 LAB — MAGNESIUM: Magnesium: 1.7 mg/dL (ref 1.7–2.4)

## 2021-04-03 MED ORDER — CLONAZEPAM 1 MG PO TABS
1.0000 mg | ORAL_TABLET | Freq: Three times a day (TID) | ORAL | Status: DC | PRN
  Administered 2021-04-03 – 2021-04-04 (×2): 1 mg via ORAL
  Filled 2021-04-03 (×2): qty 1

## 2021-04-03 MED ORDER — CLONAZEPAM 0.5 MG PO TABS
0.5000 mg | ORAL_TABLET | Freq: Three times a day (TID) | ORAL | Status: DC | PRN
  Administered 2021-04-03: 0.5 mg via ORAL
  Filled 2021-04-03: qty 1

## 2021-04-03 NOTE — Evaluation (Signed)
Clinical/Bedside Swallow Evaluation Patient Details  Name: Johnny Rice MRN: [id number] Date of Birth: [birth year]  Today's Date: 04/03/2021 Time: SLP Start Time (ACUTE ONLY): 0955 SLP Stop Time (ACUTE ONLY): 1045 SLP Time Calculation (min) (ACUTE ONLY): 50 min  Past Medical History:  Past Medical History:  Diagnosis Date   Hypertension    Stroke [facility])    Past Surgical History:  Past Surgical History:  Procedure Laterality Date   APPENDECTOMY     HPI:  Pt is a 75 year old male with past history of previous CVA with chronic left facial droop, hypertension, major depressive disorder, benign prostatic hyperplasia, hyperlipidemia who presents to [facility] emergency department from his assisted living Facility status post fall via EMS.  Patient explains that for the past several days he has been developing increasing generalized weakness.  Generalized weakness and severe in intensity, worse with any exertion and improved with rest.  This is associated with frequent falls.  Patient denies any associated episodes of loss of consciousness.  Patient is also complaining of associated poor appetite over the span of time.   CXR: "Mild elevation of the left hemidiaphragm and left lung base  atelectasis.";  Head CT: "No evidence of acute intracranial abnormality.     Redemonstrated chronic cortical/subcortical infarct within the right  parietal, occipital and temporal lobes".   Assessment / Plan / Recommendation Clinical Impression  Pt appears to present w/ grossly adequate oropharyngeal phase swallow w/ No oropharyngeal phase dysphagia noted, Pt consumed po trials w/ No overt, clinical s/s of aspiration during po trials. Pt appears at reduced risk for aspiration following general aspiration precautions.   During po trials, pt consumed consistencies (desired) w/ no overt coughing, decline in vocal quality, or change in respiratory presentation during/post trials. Oral phase appeared [facility] w/ bolus management and  control of bolus propulsion for A-P transfer for swallowing. Oral clearing achieved w/ trial consistencies consumed. Pt declined solids but stated mastication of meats and breads was "difficult" for him. OM Exam revealed L unilateral weakness noted, moreso labial. Speech intelligible; Dysarthria+. Pt fed self w/ setup support.   Recommend a more Mech soft consistency diet w/ well-Cut/MINCED meats, moistened foods; Thin liquids. Recommend general aspiration precautions, support positioning and prepping tray at meals. Pills WHOLE in Puree for safer, easier swallowing as pt described Larger pills causing difficulty to swallow at times. Education given on Pills in Puree; food consistencies and easy to eat options; general aspiration precautions. NSG updated. ST services will monitor while admitted. Recommend Dietician f/u d/t reported reduced oral intake/desire for po's. SLP Visit Diagnosis: Dysphagia, oral phase (R13.11) (baseline)    Aspiration Risk  Mild aspiration risk;Risk for inadequate nutrition/hydration    Diet Recommendation  Mech soft consistency diet w/ well-Cut/MINCED meats, moistened foods; Thin liquids. Recommend general aspiration precautions, support positioning and prepping tray at meals.  Medication Administration: Whole meds with puree (for safer easier swallowing)    Other  Recommendations Recommended Consults:  (Dietician f/u) Oral Care Recommendations: Oral care BID;Oral care before and after PO;Staff/trained caregiver to provide oral care Other Recommendations:  (n/a)   Follow up Recommendations None (TBD)      Frequency and Duration min 2x/week  1 week       Prognosis Prognosis for Safe Diet Advancement: Fair (-Good) Barriers to Reach Goals: Cognitive deficits;Severity of deficits;Time post onset Barriers/Prognosis Comment: Left oral weakness baseline      Swallow Study   General Date of Onset: 04/02/21 HPI: Pt is a 75 year old male with past history  of previous  CVA with chronic left facial droop, hypertension, major depressive disorder, benign prostatic hyperplasia, hyperlipidemia who presents to [facility] emergency department from his assisted living Facility status post fall via EMS.  Patient explains that for the past several days he has been developing increasing generalized weakness.  Generalized weakness and severe in intensity, worse with any exertion and improved with rest.  This is associated with frequent falls.  Patient denies any associated episodes of loss of consciousness.  Patient is also complaining of associated poor appetite over the span of time.   CXR: "Mild elevation of the left hemidiaphragm and left lung base  atelectasis.";  Head CT: "No evidence of acute intracranial abnormality.     Redemonstrated chronic cortical/subcortical infarct within the right  parietal, occipital and temporal lobes". Type of Study: Bedside Swallow Evaluation Previous Swallow Assessment: none Diet Prior to this Study: Regular;Thin liquids Temperature Spikes Noted: No (wbc 5.8) Respiratory Status: Room air History of Recent Intubation: No Behavior/Cognition: Alert;Cooperative;Pleasant mood;Distractible;Requires cueing Oral Cavity Assessment: Within Functional Limits Oral Care Completed by SLP: Recent completion by staff Oral Cavity - Dentition:  (Top dentition only) Vision: Functional for self-feeding Self-Feeding Abilities: Able to feed self;Needs assist;Needs set up Patient Positioning: Upright in bed (needed support for Upright sitting) Baseline Vocal Quality: Normal Volitional Cough: Strong Volitional Swallow: Able to elicit    Oral/Motor/Sensory Function Overall Oral Motor/Sensory Function: Mild impairment (++) Facial ROM: Reduced left Facial Symmetry: Abnormal symmetry left Facial Strength: Reduced left Lingual ROM: Within Functional Limits Lingual Symmetry: Within Functional Limits Lingual Strength: Within Functional Limits Mandible: Within  Functional Limits   Ice Chips Ice chips: Not tested   Thin Liquid Thin Liquid: Within functional limits Presentation: Cup;Self Fed (~3 ozs)    Nectar Thick Nectar Thick Liquid: Not tested   Honey Thick Honey Thick Liquid: Not tested   Puree Puree: Within functional limits Presentation: Spoon;Self Fed (supported; 6 trials)   Solid     Solid: Not tested Other Comments: declined        Jerilynn Som, MS, [employer] [facility] [phone] [place] 04/03/2021,5:08 PM

## 2021-04-03 NOTE — Progress Notes (Signed)
PT Cancellation Note  Patient Details Name: Johnny Rice MRN: [id number] DOB: December 22, 1945   Cancelled Treatment:    Reason Eval/Treat Not Completed: Patient declined, no reason specified. Patient wants to take a nap and is feeling too weak. Will return later.     Lambert Jeanty 04/03/2021, 11:34 AM

## 2021-04-03 NOTE — Progress Notes (Signed)
PT Cancellation Note  Patient Details Name: Johnny Rice MRN: [id number] DOB: [birth year]   Cancelled Treatment:    Reason Eval/Treat Not Completed: Patient declined, no reason specified;Fatigue/lethargy limiting ability to participate. Patient sleeping and declines getting up to recliner. Wants to sleep. Requests that PT come back tomorrow.     Bradley Handyside 04/03/2021, 3:32 PM

## 2021-04-03 NOTE — Progress Notes (Signed)
PROGRESS NOTE    Johnny Rice  RDE:[id number] DOB: 12-21-1945 DOA: 04/02/2021 PCP: [facility]   Brief Narrative:   75 year old male with past history of previous CVA with chronic left facial droop, hypertension, major depressive disorder, benign prostatic hyperplasia, hyperlipidemia who presents to [facility] emergency department from his assisted Facility status post fall via EMS.   Patient explains that for the past several days he has been developing increasing generalized weakness.  Generalized weakness and severe in intensity, worse with any exertion and improved with rest.  This is associated with frequent falls.  Patient denies any associated episodes of loss of consciousness.  Patient is also complaining of associated poor appetite over the span of time.  Patient denies fevers, sick contacts, abdominal pain, recent travel, nausea, vomiting or diarrhea.  Due to progressively worsening weakness the patient was eventually brought into [facility] emergency department via EMS for further evaluation.   Upon evaluation in the emergency department urinalysis was performed and was found to be suggestive of a urinary tract infection.  Patient was initiated on intravenous ceftriaxone.  Patient was also noted to be quite depressed and therefore Dr. Vickey Sages with psychiatry was consulted to evaluate patient and patient's psychotropic regimen was adjusted.  The hospitalist group was then called to assess the patient for admission to the hospital.  Patient seen and examined.  Continues to endorse dysuria.  Does express concern regarding the reduced dose of clonazepam.   Assessment & Plan:   Principal Problem:   Acute cystitis without hematuria Active Problems:   BPH (benign prostatic hyperplasia)   Frequent falls   Mixed hyperlipidemia   Essential hypertension   Moderate recurrent major depression (HCC)  Acute cystitis without hematuria Urinalysis suggestive of UTI Associated with poor  oral intake, generalized weakness, frequent falls, dysuria Like related to long history of BPH No evidence of pyelonephritis on CT scan Plan: Continue IV fluids Continue Rocephin 1 g every 24 hours Follow blood and urine culture for pathogen identification, no growth to date  Frequent falls Occurring with increasing frequency over the last several days.  Associated generalized weakness.  On CT head without acute stroke.  No loss of consciousness. Likely related to underlying urinary tract infection Plan: Therapy evaluation Fall precautions  Reported dysphagia Patient reports several week history of difficulty swallowing and feeling like food is getting stuck No evidence of aspiration on chest x-ray Seems to be tolerating p.o. at this moment Plan: SLP evaluation ordered and pending Nonemergent GI evaluation  Incidental kidney lesion Incidental finding on CT imaging of abdomen pelvis Unclear significance, outpatient MRI to further characterize  BPH Patient does not appear to be taking his Flomax.  This is likely underlying some of his urinary tract infection.  We will resume this medication.  Essential hypertension Hyperlipidemia Continue home regimen of Lipitor and amlodipine  Major depressive disorder Evaluated per his request by psychiatry in the emergency department.  Dose of clonazepam felt to be too high and possibly contributing to weakness and daytime sleepiness.  No evidence of suicidal homicidal ideation.  No indication for psychiatric hospitalization. Plan: Clonazepam was initially dropped 2.5 mg twice daily from home dose of 1 mg 3 times daily.  Patient did not tolerate this transition.  Will increase to 0.5 mg p.o. 3 times daily.  Continue remainder of psychiatric regimen.  Appreciate psychiatry recommendations.    DVT prophylaxis: SQ Lovenox Code Status: Full Family Communication:  Disposition Plan: Status is: Observation  The patient will require care  spanning > 2 midnights and should be moved to inpatient because: Inpatient level of care appropriate due to severity of illness  Dispo: The patient is from: ALF              Anticipated d/c is to: ALF              Patient currently is not medically stable to d/c.   Difficult to place patient No  Acute cystitis associated with weakness and frequent falls.  Needs IV antibiotics and therapy evaluations.     Level of care: Med-Surg  Consultants:  None  Procedures:  None  Antimicrobials:  Ceftriaxone   Subjective: Patient seen and examined.  Continues to endorse dysuria.  No other complaints.  Objective: Vitals:   04/02/21 2108 04/03/21 0630 04/03/21 0827 04/03/21 1137  BP: 136/79 125/87 (!) 147/86 118/80  Pulse: (!) 56 71 65 70  Resp: 16 18 18 18   Temp: 98.4 F (36.9 C) 98.2 F (36.8 C) 98.4 F (36.9 C) 98.2 F (36.8 C)  TempSrc: Oral Oral Oral Oral  SpO2: 99% 98% 98% 100%  Weight:      Height:        Intake/Output Summary (Last 24 hours) at 04/03/2021 1336 Last data filed at 04/03/2021 1211 Gross per 24 hour  Intake 2040.46 ml  Output 1201 ml  Net 839.46 ml   Filed Weights   04/02/21 1013  Weight: 90.7 kg    Examination:  General exam: No acute distress.  Appears frail Respiratory system: Lungs clear.  Normal work of breathing.  Room air Cardiovascular system: S1-S2, regular rate and rhythm, no murmurs, no pedal edema Gastrointestinal system: Soft, nontender, nondistended, normal bowel sounds Central nervous system: Alert and oriented.  No focal deficits.  Hearing loss Extremities: Symmetric 5 x 5 power. Skin: No rashes, lesions or ulcers Psychiatry: Judgement and insight appear impaired. Mood & affect anxious.     Data Reviewed: I have personally reviewed following labs and imaging studies  CBC: Recent Labs  Lab 04/02/21 1016 04/03/21 0605  WBC 6.9 5.8  NEUTROABS  --  2.7  HGB 12.8* 11.9*  HCT 37.3* 34.8*  MCV 85.9 85.1  PLT 275 238    Basic Metabolic Panel: Recent Labs  Lab 04/02/21 1016 04/03/21 0605  NA 139 142  K 4.2 3.7  CL 105 105  CO2 29 28  GLUCOSE 132* 93  BUN 10 12  CREATININE 0.94 0.76  CALCIUM 9.3 9.0  MG  --  1.7   GFR: Estimated Creatinine Clearance: 89.6 mL/min (by C-G formula based on SCr of 0.76 mg/dL). Liver Function Tests: Recent Labs  Lab 04/02/21 1016 04/03/21 0605  AST 22 14*  ALT 15 13  ALKPHOS 143* 125  BILITOT 0.7 0.7  PROT 6.9 6.3*  ALBUMIN 4.1 3.5   No results for input(s): LIPASE, AMYLASE in the last 168 hours. No results for input(s): AMMONIA in the last 168 hours. Coagulation Profile: No results for input(s): INR, PROTIME in the last 168 hours. Cardiac Enzymes: No results for input(s): CKTOTAL, CKMB, CKMBINDEX, TROPONINI in the last 168 hours. BNP (last 3 results) No results for input(s): PROBNP in the last 8760 hours. HbA1C: No results for input(s): HGBA1C in the last 72 hours. CBG: No results for input(s): GLUCAP in the last 168 hours. Lipid Profile: No results for input(s): CHOL, HDL, LDLCALC, TRIG, CHOLHDL, LDLDIRECT in the last 72 hours. Thyroid Function Tests: Recent Labs    04/03/21 0605  TSH 0.236*  Anemia Panel: Recent Labs    04/03/21 0605  VITAMINB12 181  FOLATE 7.0   Sepsis Labs: No results for input(s): PROCALCITON, LATICACIDVEN in the last 168 hours.  Recent Results (from the past 240 hour(s))  Resp Panel by RT-PCR (Flu A&B, Covid) Nasopharyngeal Swab     Status: None   Collection Time: 04/02/21  5:13 PM   Specimen: Nasopharyngeal Swab; Nasopharyngeal(NP) swabs in vial transport medium  Result Value Ref Range Status   SARS Coronavirus 2 by RT PCR NEGATIVE NEGATIVE Final    Comment: (NOTE) SARS-CoV-2 target nucleic acids are NOT DETECTED.  The SARS-CoV-2 RNA is generally detectable in upper respiratory specimens during the acute phase of infection. The lowest concentration of SARS-CoV-2 viral copies this assay can detect is 138  copies/mL. A negative result does not preclude SARS-Cov-2 infection and should not be used as the sole basis for treatment or other patient management decisions. A negative result may occur with  improper specimen collection/handling, submission of specimen other than nasopharyngeal swab, presence of viral mutation(s) within the areas targeted by this assay, and inadequate number of viral copies(<138 copies/mL). A negative result must be combined with clinical observations, patient history, and epidemiological information. The expected result is Negative.  Fact Sheet for Patients:  BloggerCourse.com  Fact Sheet for Healthcare Providers:  SeriousBroker.it  This test is no t yet approved or cleared by the [place] FDA and  has been authorized for detection and/or diagnosis of SARS-CoV-2 by FDA under an Emergency Use Authorization (EUA). This EUA will remain  in effect (meaning this test can be used) for the duration of the COVID-19 declaration under Section 564(b)(1) of the Act, 21 U.S.C.section 360bbb-3(b)(1), unless the authorization is terminated  or revoked sooner.       Influenza A by PCR NEGATIVE NEGATIVE Final   Influenza B by PCR NEGATIVE NEGATIVE Final    Comment: (NOTE) The Xpert Xpress SARS-CoV-2/FLU/RSV plus assay is intended as an aid in the diagnosis of influenza from Nasopharyngeal swab specimens and should not be used as a sole basis for treatment. Nasal washings and aspirates are unacceptable for Xpert Xpress SARS-CoV-2/FLU/RSV testing.  Fact Sheet for Patients: BloggerCourse.com  Fact Sheet for Healthcare Providers: SeriousBroker.it  This test is not yet approved or cleared by the [place] FDA and has been authorized for detection and/or diagnosis of SARS-CoV-2 by FDA under an Emergency Use Authorization (EUA). This EUA will remain in effect (meaning  this test can be used) for the duration of the COVID-19 declaration under Section 564(b)(1) of the Act, 21 U.S.C. section 360bbb-3(b)(1), unless the authorization is terminated or revoked.  Performed at [facility], [address] [place], [place] [address]   Culture, blood (Routine X 2) w Reflex to ID Panel     Status: None (Preliminary result)   Collection Time: 04/02/21  9:19 PM   Specimen: BLOOD  Result Value Ref Range Status   Specimen Description BLOOD BLOOD RIGHT HAND  Final   Special Requests   Final    BOTTLES DRAWN AEROBIC AND ANAEROBIC Blood Culture adequate volume   Culture   Final    NO GROWTH < 12 HOURS Performed at [facility], [address] [place], [place] [address]    Report Status PENDING  Incomplete  Culture, blood (Routine X 2) w Reflex to ID Panel     Status: None (Preliminary result)   Collection Time: 04/02/21  9:24 PM   Specimen: BLOOD  Result Value Ref Range  Status   Specimen Description BLOOD LEFT ANTECUBITAL  Final   Special Requests   Final    BOTTLES DRAWN AEROBIC AND ANAEROBIC Blood Culture adequate volume   Culture   Final    NO GROWTH < 12 HOURS Performed at [facility], [address] [place], [place] [address]    Report Status PENDING  Incomplete         Radiology Studies: DG Chest 1 View  Result Date: 04/02/2021 CLINICAL DATA:  75 year old male with fall and generalized pain. EXAM: CHEST  1 VIEW COMPARISON:  None. FINDINGS: There is mild eventration of the left hemidiaphragm. Left lung base atelectasis. Infiltrate is less likely but not excluded. No pleural effusion pneumothorax. Mild cardiomegaly. No acute osseous pathology. IMPRESSION: Mild elevation of the left hemidiaphragm and left lung base atelectasis. Electronically Signed   By: Elgie CollardArash  Radparvar M.D.   On: 04/02/2021 20:05   CT Head Wo Contrast  Result Date: 04/02/2021 CLINICAL DATA:  Mental status change, unknown cause. EXAM: CT HEAD WITHOUT  CONTRAST TECHNIQUE: Contiguous axial images were obtained from the base of the skull through the vertex without intravenous contrast. COMPARISON:  Prior head CT examinations 01/16/2021 and earlier. Brain MRI 04/12/2020. FINDINGS: Brain: Redemonstrated chronic cortical/subcortical infarct within the right parietal, occipital and temporal lobes (right PCA and to a lesser degree right MCA, territories). Associated ex vacuo dilatation of the right lateral ventricle. Background mild generalized cerebral atrophy. Mild patchy and ill-defined hypoattenuation within the cerebral white matter, nonspecific but compatible with chronic small vessel ischemic disease. There is no acute intracranial hemorrhage. No acute demarcated cortical infarct. No extra-axial fluid collection. No evidence of intracranial mass. No midline shift. Vascular: No hyperdense vessel.  Atherosclerotic calcifications Skull: Normal. Negative for fracture or focal lesion. Sinuses/Orbits: Streak artifact arising from a metallic focus in the left periorbital region limits evaluation of the left orbit. No acute finding within the right orbit. Trace mucosal thickening within the bilateral ethmoid sinuses. Other: Redemonstrated sequela of prior left mastoidectomy. IMPRESSION: No evidence of acute intracranial abnormality. Redemonstrated chronic cortical/subcortical infarct within the right parietal, occipital and temporal lobes. Stable background mild generalized cerebral atrophy and chronic small vessael ischemic disease. Electronically Signed   By: Jackey LogeKyle  Golden DO   On: 04/02/2021 14:29   CT Renal Stone Study  Result Date: 04/02/2021 CLINICAL DATA:  Status post fall last night. EXAM: CT ABDOMEN AND PELVIS WITHOUT CONTRAST TECHNIQUE: Multidetector CT imaging of the abdomen and pelvis was performed following the standard protocol without IV contrast. COMPARISON:  None. FINDINGS: Lower chest: There is cardiomegaly. No pleural or pericardial effusion. Lung  bases are clear. Hepatobiliary: No focal liver abnormality is seen. No gallstones, gallbladder wall thickening, or biliary dilatation. Pancreas: Unremarkable. No pancreatic ductal dilatation or surrounding inflammatory changes. Spleen: Normal in size without focal abnormality. Adrenals/Urinary Tract: The adrenal glands are negative. There is a 0.7 cm nonobstructing stone in the upper pole of the left kidney. A 1.4 cm hyperdense lesion off the upper pole of the left kidney has density units of approximately 49. The kidneys are otherwise negative. Ureters and urinary bladder appear normal. Stomach/Bowel: Stomach is within normal limits. The appendix is not seen but no evidence of appendicitis is identified. No evidence of bowel wall thickening, distention, or inflammatory changes. Descending and sigmoid colon diverticulosis noted Vascular/Lymphatic: No significant vascular findings are present. No enlarged abdominal or pelvic lymph nodes. Reproductive: Prostate is unremarkable. Other: Negative. Musculoskeletal: No acute or focal abnormality. Convex left scoliosis and  degenerative disease L3-4 and L4-5 noted. IMPRESSION: No acute abnormality abdomen or pelvis. Nonspecific 1.4 cm hyperdense lesion left kidney. Nonemergent MRI with and without contrast is recommended for further evaluation. 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 Nonobstructing 0.7 cm stone left kidney. Electronically Signed   By: Drusilla Kanner M.D.   On: 04/02/2021 14:29        Scheduled Meds:  amLODipine  10 mg Oral Daily   aspirin  81 mg Oral Daily   atorvastatin  20 mg Oral Daily   DULoxetine  60 mg Oral Daily   enoxaparin (LOVENOX) injection  40 mg Subcutaneous Q24H   fluticasone  2 spray Each Nare Daily   gabapentin  300 mg Oral TID   lisinopril  40 mg Oral Daily   polyvinyl alcohol  2 drop Both Eyes Daily   tamsulosin  0.4 mg Oral Daily   traZODone  50 mg Oral QHS   Continuous Infusions:  sodium chloride 75 mL/hr at 04/03/21  0848   cefTRIAXone (ROCEPHIN)  IV       LOS: 0 days    Time spent: 25 minutes    [name], MD [facility] Pager [phone]  If 7PM-7AM, please contact night-coverage 04/03/2021, 1:36 PM

## 2021-04-04 MED ORDER — CLONAZEPAM 1 MG PO TABS
1.0000 mg | ORAL_TABLET | Freq: Once | ORAL | Status: AC
  Administered 2021-04-04: 1 mg via ORAL
  Filled 2021-04-04: qty 1

## 2021-04-04 MED ORDER — AMOXICILLIN-POT CLAVULANATE 875-125 MG PO TABS: 1.0000 | ORAL_TABLET | Freq: Two times a day (BID) | ORAL | 0 refills | Status: AC

## 2021-04-04 NOTE — TOC Initial Note (Addendum)
Transition of Care [facility]) - Initial/Assessment Note    Patient Details  Name: Johnny Rice MRN: [id number] Date of Birth: 14-Dec-1945  [facility] CM/SW Contact:    [name], LCSW Phone Number: 04/04/2021, 11:57 AM  Clinical Narrative:   CSW spoke to [place] at [facility] to notify her of plan for discharge today. PT recommending home health. Their preferred provider is [facility] (previously [facility] Left message for their representative to see if they can accept referral. Patient is aware and agreeable. [facility] is checking to see if they can pick him up or if he will need transport back to the facility. Patient said it would be difficult for his daughter to pick him up because she has a 73 year old. No further concerns. CSW encouraged patient to contact CSW as needed. CSW will continue to follow patient for support and facilitate return to ALF today.     1:31 pm: Patient accepted by [facility] for PT and OT. Faxed [facility] and discharge summary to ALF for review.            Expected Discharge Plan: Assisted Living (with home health) Barriers to Discharge: No Barriers Identified   Patient Goals and CMS Choice        Expected Discharge Plan and Services Expected Discharge Plan: Assisted Living (with home health)     Post Acute Care Choice: Home Health Living arrangements for the past 2 months: Assisted Living Facility Expected Discharge Date: 04/04/21                                    Prior Living Arrangements/Services Living arrangements for the past 2 months: Assisted Living Facility Lives with:: Facility Resident Patient language and need for interpreter reviewed:: Yes Do you feel safe going back to the place where you live?: Yes      Need for Family Participation in Patient Care: Yes (Comment) Care giver support system in place?: Yes (comment)   Criminal Activity/Legal Involvement Pertinent to Current Situation/Hospitalization: No - Comment as  needed  Activities of Daily Living Home Assistive Devices/Equipment: None ADL Screening (condition at time of admission) Patient's cognitive ability adequate to safely complete daily activities?: Yes Is the patient deaf or have difficulty hearing?: Yes Does the patient have difficulty seeing, even when wearing glasses/contacts?: Yes Does the patient have difficulty concentrating, remembering, or making decisions?: Yes Patient able to express need for assistance with ADLs?: Yes Does the patient have difficulty dressing or bathing?: Yes Independently performs ADLs?: No Communication: Independent Dressing (OT): Needs assistance Is this a change from baseline?: Pre-admission baseline Grooming: Needs assistance Is this a change from baseline?: Pre-admission baseline Feeding: Independent Bathing: Needs assistance Is this a change from baseline?: Pre-admission baseline Toileting: Needs assistance Is this a change from baseline?: Pre-admission baseline In/Out Bed: Needs assistance Is this a change from baseline?: Pre-admission baseline Does the patient have difficulty walking or climbing stairs?: Yes Weakness of Legs: Both Weakness of Arms/Hands: Left  Permission Sought/Granted Permission sought to share information with : Oceanographer granted to share information with : Yes, Verbal Permission Granted     Permission granted to share info w AGENCY: [facility], [facility]        Emotional Assessment Appearance:: Appears stated age Attitude/Demeanor/Rapport: Engaged, Gracious Affect (typically observed): Accepting, Appropriate, Calm, Pleasant Orientation: : Oriented to Self, Oriented to Place, Oriented to  Time, Oriented to Situation Alcohol / Substance Use: Not Applicable Psych Involvement: No (comment)  Admission diagnosis:  Lower urinary tract infectious disease [N39.0] Pneumonia [J18.9] Acute cystitis without hematuria [N30.00] Fall,  initial encounter [W19.XXXA] Cystitis [N30.90] Patient Active Problem List   Diagnosis Date Noted   Cystitis 04/03/2021   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   Acute cystitis without hematuria 04/02/2021   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   Demand ischemia of myocardium (HCC) 01/16/2020   Severe visual impairment 10/18/2019   Alcohol use disorder, severe, 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HCC) 08/04/2019   History of CVA (cerebrovascular accident) 06/11/2019   Severe sedative, hypnotic, 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   Frequent falls 12/19/2016   Anxiety disorder, unspecified 03/12/2016   Insomnia 02/11/2016   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   Cerebrovascular accident (CVA) (HCC) 05/04/2014   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   Neurocognitive disorder 09/07/2013   Osteoarthritis 07/11/2013   Mild cognitive impairment 06/28/2013   BPH (benign prostatic hyperplasia) 01/24/2013   Depression 01/24/2013   Mixed hyperlipidemia 01/24/2013   Essential hypertension 01/24/2013   Hearing loss 09/02/2012   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

## 2021-04-04 NOTE — [place] FL2 (Signed)
[place] MEDICAID [id number] LEVEL OF CARE SCREENING TOOL     IDENTIFICATION  Patient Name: Johnny Rice Birthdate: 1946/08/18 Sex: male Admission Date (Current Location): 04/02/2021  [facility] and [place] Number:  Chiropodist and Address:  [facility], [address], [place], [place] [address]      Provider Number: [id number]  Attending Physician Name and Address:  [name], MD  Relative Name and Phone Number:       Current Level of Care: Hospital Recommended Level of Care: Assisted Living Facility (with PT and OT through [place]) Prior Approval Number:    Date Approved/Denied:   PASRR Number:    Discharge Plan: Other (Comment) (ALF with PT and OT through [facility])    Current Diagnoses: Patient Active Problem List   Diagnosis Date Noted   Cystitis 04/03/2021   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   Acute cystitis without hematuria 04/02/2021   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   Demand ischemia of myocardium (HCC) 01/16/2020   Severe visual impairment 10/18/2019   Alcohol use disorder, severe, 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HCC) 08/04/2019   History of CVA (cerebrovascular accident) 06/11/2019   Severe sedative, hypnotic, 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   Frequent falls 12/19/2016   Anxiety disorder, unspecified 03/12/2016   Insomnia 02/11/2016   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   Cerebrovascular accident (CVA) (HCC) 05/04/2014   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   Neurocognitive disorder 09/07/2013   Osteoarthritis 07/11/2013   Mild cognitive impairment 06/28/2013   BPH (benign prostatic hyperplasia) 01/24/2013   Depression 01/24/2013   Mixed hyperlipidemia 01/24/2013   Essential hypertension 01/24/2013   Hearing loss 09/02/2012   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) 09/01/2012    Orientation RESPIRATION  BLADDER Height & Weight     Self, Time, Situation, Place  Normal Continent Weight: 200 lb (90.7 kg) Height:  5\' 7"  (170.2 cm)  BEHAVIORAL SYMPTOMS/MOOD NEUROLOGICAL BOWEL NUTRITION STATUS   (None)  (None) Continent Diet (Heart healthy)  AMBULATORY STATUS COMMUNICATION OF NEEDS Skin   Limited Assist Verbally Normal                       Personal Care Assistance Level of Assistance  Bathing, Feeding, Dressing Bathing Assistance: Limited assistance Feeding assistance: Independent Dressing Assistance: Limited assistance     Functional Limitations Info  Sight, Hearing, Speech Sight Info: Adequate Hearing Info: Adequate Speech Info: Adequate    SPECIAL CARE FACTORS FREQUENCY  PT (By licensed PT), OT (By licensed OT)     PT Frequency: 3 x week OT Frequency: 3 x week            Contractures Contractures Info: Not present    Additional Factors Info  Code Status, Allergies Code Status Info: Full code Allergies Info: NKDA           Current Medications (04/04/2021):  This is the current hospital active medication list Current Facility-Administered Medications  Medication Dose Route Frequency Provider Last Rate Last Admin   0.9 %  sodium chloride infusion   Intravenous Continuous 04/06/2021 B, MD 75 mL/hr at 04/04/21 0513 New Bag at 04/04/21 0513   acetaminophen (TYLENOL) tablet 650 mg  650 mg Oral Q6H PRN 04/06/21, MD   650 mg at 04/04/21 04/06/21   Or   acetaminophen (TYLENOL) suppository 650 mg  650 mg Rectal Q6H PRN Shalhoub, 2585, MD  amLODipine (NORVASC) tablet 10 mg  10 mg Oral Daily Shalhoub, Deno Lunger, MD   10 mg at 04/04/21 0844   aspirin chewable tablet 81 mg  81 mg Oral Daily Marinda Elk, MD   81 mg at 04/04/21 0844   atorvastatin (LIPITOR) tablet 20 mg  20 mg Oral Daily Shalhoub, Deno Lunger, MD   20 mg at 04/04/21 0845   cefTRIAXone (ROCEPHIN) 1 g in sodium chloride 0.9 % 100 mL IVPB  1 g Intravenous Q24H Shalhoub, Deno Lunger, MD    Stopped at 04/03/21 1701   clonazePAM (KLONOPIN) tablet 1 mg  1 mg Oral TID PRN Lolita Patella B, MD   1 mg at 04/04/21 0844   DULoxetine (CYMBALTA) DR capsule 60 mg  60 mg Oral Daily Marinda Elk, MD   60 mg at 04/04/21 0844   enoxaparin (LOVENOX) injection 40 mg  40 mg Subcutaneous Q24H Marinda Elk, MD   40 mg at 04/03/21 2134   fluticasone (FLONASE) 50 MCG/ACT nasal spray 2 spray  2 spray Each Nare Daily Shalhoub, Deno Lunger, MD   2 spray at 04/04/21 0848   gabapentin (NEURONTIN) capsule 300 mg  300 mg Oral TID Marinda Elk, MD   300 mg at 04/04/21 0844   hydrALAZINE (APRESOLINE) injection 10 mg  10 mg Intravenous Q6H PRN Shalhoub, Deno Lunger, MD       lisinopril (ZESTRIL) tablet 40 mg  40 mg Oral Daily Shalhoub, Deno Lunger, MD   40 mg at 04/04/21 0844   ondansetron (ZOFRAN) tablet 4 mg  4 mg Oral Q6H PRN Shalhoub, Deno Lunger, MD       Or   ondansetron [facility]) injection 4 mg  4 mg Intravenous Q6H PRN Shalhoub, Deno Lunger, MD       polyethylene glycol (MIRALAX / GLYCOLAX) packet 17 g  17 g Oral Daily PRN Shalhoub, Deno Lunger, MD       polyvinyl alcohol (LIQUIFILM TEARS) 1.4 % ophthalmic solution 2 drop  2 drop Both Eyes Daily Shalhoub, Deno Lunger, MD   2 drop at 04/04/21 0849   tamsulosin (FLOMAX) capsule 0.4 mg  0.4 mg Oral Daily Shalhoub, Deno Lunger, MD   0.4 mg at 04/04/21 0844   traZODone (DESYREL) tablet 50 mg  50 mg Oral QHS Marinda Elk, MD   50 mg at 04/03/21 2133     Discharge Medications: STOP taking these medications     amoxicillin 500 MG tablet Commonly known as: AMOXIL    diclofenac Sodium 1 % Gel Commonly known as: VOLTAREN    hydrOXYzine 50 MG tablet Commonly known as: ATARAX/VISTARIL    polyethylene glycol 17 g packet Commonly known as: MIRALAX / GLYCOLAX    senna 8.6 MG tablet Commonly known as: SENOKOT    traZODone 50 MG tablet Commonly known as: DESYREL           TAKE these medications     acetaminophen 500 MG tablet Commonly known as:  TYLENOL Take 500 mg by mouth every 6 (six) hours as needed.    amLODipine 10 MG tablet Commonly known as: NORVASC Take 1 tablet by mouth daily.    amoxicillin-clavulanate 875-125 MG tablet Commonly known as: Augmentin Take 1 tablet by mouth 2 (two) times daily for 4 days. Start taking on: April 05, 2021    ARIPiprazole 5 MG tablet Commonly known as: Abilify Take 1 tablet (5 mg total) by mouth daily.    Artificial Tears 1.4 % ophthalmic solution Generic  drug: polyvinyl alcohol Place 2 drops into both eyes daily.    Aspirin Low Dose 81 MG chewable tablet Generic drug: aspirin Chew 81 mg by mouth daily. What changed: Another medication with the same name was removed. Continue taking this medication, and follow the directions you see here.    atorvastatin 20 MG tablet Commonly known as: LIPITOR Take 20 mg by mouth daily.    clonazePAM 0.5 MG tablet Commonly known as: KLONOPIN Take 1 mg by mouth in the morning, at noon, and at bedtime.    DULoxetine 60 MG capsule Commonly known as: CYMBALTA Take 90 mg by mouth daily.    fluticasone 50 MCG/ACT nasal spray Commonly known as: FLONASE Place into both nostrils daily.    gabapentin 300 MG capsule Commonly known as: NEURONTIN Take 900 mg by mouth at bedtime. What changed: Another medication with the same name was removed. Continue taking this medication, and follow the directions you see here.    hydrocortisone cream 0.5 % Apply 1 application topically 2 (two) times daily.    lisinopril 40 MG tablet Commonly known as: ZESTRIL Take 40 mg by mouth daily.    tamsulosin 0.4 MG Caps capsule Commonly known as: FLOMAX Take 1 capsule (0.4 mg total) by mouth daily.    Relevant Imaging Results:  Relevant Lab Results:   Additional Information SS#: [id number]  [name], LCSW

## 2021-04-04 NOTE — Progress Notes (Signed)
Speech Language Pathology Treatment: Dysphagia  Patient Details Name: Johnny Rice MRN: [id number] DOB: 08-12-1946 Today's Date: 04/04/2021 Time: 9211-9417 SLP Time Calculation (min) (ACUTE ONLY): 35 min  Assessment / Plan / Recommendation Clinical Impression  Pt seen today for f/u for toleration of diet and education. Education completed on general aspiration precautions; food consistencies and preparation esp. Meats CUT WELL w/ gravy added to moisten for ease of oral phase management -- this has been an ongoing, Baseline issue for pt d/t the Left orofacial weakness s/p CVA years ago. He is also missing Lower Dentition. He appears to present w/ grossly adequate oropharyngeal phase swallow w/ No gross oropharyngeal phase dysphagia noted w/ chopped meats. Pt appears at reduced risk for aspiration following general aspiration precautions.   During po trials of Lunch meal after given tray setup, pt consumed consistencies w/ no overt coughing, decline in vocal quality, or change in respiratory presentation during/post trials. Oral phase appeared [facility] w/ bolus management and control of bolus propulsion for A-P transfer for swallowing. Min increased mastication time noted w/ meats but pt stated the "cubed", cut pieces were "the perfect size for me to manage". Pt is missing lower Dentition as well. Oral clearing achieved w/ trial consistencies consumed. Pt fed self w/ setup support.   Recommend a more Mech soft consistency diet w/ well-Cut/MINCED meats, moistened foods; Thin liquids. Recommend general aspiration precautions, support positioning and prepping tray at meals. Pills WHOLE in Puree for safer, easier swallowing as pt described Larger pills causing difficulty to swallow at times. Education given on Pills in Puree; food consistencies and easy to eat options; general aspiration precautions. NSG updated. No further ST services indicated at this time as pt appears at/near his Baseline. Recommend Dietician  f/u d/t reported reduced oral intake/desire for po's.       HPI HPI: Pt is a 75 year old male with past history of previous CVA with chronic left facial droop, hypertension, major depressive disorder, benign prostatic hyperplasia, hyperlipidemia, and HOH baseline who presents to [facility] emergency department from his assisted living Facility status post fall via EMS.  Patient explains that for the past several days he has been developing increasing generalized weakness.  Generalized weakness and severe in intensity, worse with any exertion and improved with rest.  This is associated with frequent falls.  Patient denies any associated episodes of loss of consciousness.  Patient is also complaining of associated poor appetite over the span of time.   CXR: "Mild elevation of the left hemidiaphragm and left lung base  atelectasis.";  Head CT: "No evidence of acute intracranial abnormality.     Redemonstrated chronic cortical/subcortical infarct within the right  parietal, occipital and temporal lobes".      SLP Plan  All goals met       Recommendations  Diet recommendations: Dysphagia 3 (mechanical soft);Thin liquid (for ease of oral phase) Liquids provided via: Cup Medication Administration: Whole meds with puree (for safer swallowing as needed) Supervision: Patient able to self feed (setup support) Compensations: Minimize environmental distractions;Slow rate;Small sips/bites;Lingual sweep for clearance of pocketing;Multiple dry swallows after each bite/sip;Follow solids with liquid Postural Changes and/or Swallow Maneuvers: Out of bed for meals;Seated upright 90 degrees;Upright 30-60 min after meal                General recommendations:  (Dietician f/u) Oral Care Recommendations: Oral care BID;Oral care before and after PO;Staff/trained caregiver to provide oral care Follow up Recommendations: None SLP Visit Diagnosis: Dysphagia, oral phase (R13.11) (baseline left  oral weakness) Plan: All  goals met       GO                 Orinda Kenner, MS, CCC-SLP Speech Language Pathologist Rehab Services [phone] [facility] 04/04/2021, 3:04 PM

## 2021-04-04 NOTE — TOC Transition Note (Signed)
Transition of Care [facility]) - CM/SW Discharge Note   Patient Details  Name: Johnny Rice MRN: [id number] Date of Birth: [birth year]  [facility] Contact:  [name], LCSW Phone Number: 04/04/2021, 1:41 PM   Clinical Narrative:   Patient has orders to discharge back to [facility] today. RN will call report to [phone]. Facility will pick him up at 2:00. Will fax completed FL2 to ALF once MD has cosigned. No further concerns. CSW signing off.  Final next level of care: Assisted Living (with home health) Barriers to Discharge: No Barriers Identified   Patient Goals and CMS Choice     Choice offered to / list presented to : Patient  Discharge Placement                Patient to be transferred to facility by: Facility will transport Name of family member notified: [name] Patient and family notified of of transfer: 04/04/21  Discharge Plan and Services     Post Acute Care Choice: Home Health                    HH Arranged: PT, OT [facility] Agency: [facility] Date [facility] Agency Contacted: 04/04/21   Representative spoke with at [facility] Agency: [name]  Social Determinants of Health (SDOH) Interventions     Readmission Risk Interventions No flowsheet data found.

## 2021-04-04 NOTE — Discharge Summary (Signed)
Physician Discharge Summary  Shamel Germond ZOX:[id number] DOB: [birth year] DOA: 04/02/2021  PCP: Almetta Lovely, [facility]  Admit date: 04/02/2021 Discharge date: 04/04/2021  Admitted From: ALF Disposition: ALF  Recommendations for Outpatient Follow-up:  Follow up with PCP in 1-2 weeks   Home Health: Yes Equipment/Devices: None  Discharge Condition: Stable CODE STATUS: Full Diet recommendation: Heart healthy  Brief/Interim Summary: 75 year old male with past history of previous CVA with chronic left facial droop, hypertension, major depressive disorder, benign prostatic hyperplasia, hyperlipidemia who presents to [facility] emergency department from his assisted Facility status post fall via EMS.   Patient explains that for the past several days he has been developing increasing generalized weakness.  Generalized weakness and severe in intensity, worse with any exertion and improved with rest.  This is associated with frequent falls.  Patient denies any associated episodes of loss of consciousness.  Patient is also complaining of associated poor appetite over the span of time.  Patient denies fevers, sick contacts, abdominal pain, recent travel, nausea, vomiting or diarrhea.  Due to progressively worsening weakness the patient was eventually brought into [facility] emergency department via EMS for further evaluation.   Upon evaluation in the emergency department urinalysis was performed and was found to be suggestive of a urinary tract infection.  Patient was initiated on intravenous ceftriaxone.  Patient was also noted to be quite depressed and therefore Dr. Vickey Sages with psychiatry was consulted to evaluate patient and patient's psychotropic regimen was adjusted.  The hospitalist group was then called to assess the patient for admission to the hospital.   Patient responded to antibiotic therapy quite well.  With intravenous fluids and IV Rocephin patient's dysuria and completely resolved.  He was  ambulated on the day of discharge and was steady on his feet without being increased fall risk.  We had a lengthy discussion about his psychiatric regimen.  At time of discharge I will not adjust his home clonazepam.  Abilify added per psychiatric consult and then I encouraged the patient to discuss this with his outpatient providers.  He is discharged back to assisted living in stable condition.  Discharge Diagnoses:  Principal Problem:   Acute cystitis without hematuria Active Problems:   BPH (benign prostatic hyperplasia)   Frequent falls   Mixed hyperlipidemia   Essential hypertension   Moderate recurrent major depression (HCC)   Cystitis  Acute cystitis without hematuria Urinalysis suggestive of UTI Associated with poor oral intake, generalized weakness, frequent falls, dysuria Like related to long history of BPH No evidence of pyelonephritis on CT scan Responded to IV antibiotics and IV fluids Urine culture pending at time of discharge but will transition to p.o. Augmentin Patient encouraged to keep adequately hydrated and maintain regular urinary output   Frequent falls Occurring with increasing frequency over the last several days.  Associated generalized weakness.  On CT head without acute stroke.  No loss of consciousness. Likely related to underlying urinary tract infection Plan: Therapy evaluations on day of discharge.  Patient ambulated without being increased fall risk.  Stable for discharge back to assisted living facility.  Continue therapy evaluations outpatient   Reported dysphagia Patient reports several week history of difficulty swallowing and feeling like food is getting stuck No evidence of aspiration on chest x-ray Seems to be tolerating p.o. at this moment Plan: No obvious etiology for reported dysphagia.  Patient tolerating p.o. intake without issue. Nonemergent GI evaluation   Incidental kidney lesion Incidental finding on CT imaging of abdomen  pelvis Unclear significance, outpatient  MRI to further characterize   BPH Patient does not appear to be taking his Flomax.  This is likely underlying some of his urinary tract infection.  We will resume this medication.   Essential hypertension Hyperlipidemia Continue home regimen of Lipitor and amlodipine   Major depressive disorder Evaluated per his request by psychiatry in the emergency department.  Dose of clonazepam felt to be too high and possibly contributing to weakness and daytime sleepiness.  No evidence of suicidal homicidal ideation.  No indication for psychiatric hospitalization. Plan: Clonazepam was initially dropped 2.5 mg twice daily from home dose of 1 mg 3 times daily.  Patient did not tolerate this transition.  On day of discharge home psychiatric regimen will be resumed.  Patient is urged to seek out outpatient psychiatric advice for further titration and management of psych regimen.  Discharge Instructions  Discharge Instructions     Diet - low sodium heart healthy   Complete by: As directed    Increase activity slowly   Complete by: As directed       Allergies as of 04/04/2021   No Known Allergies      Medication List     STOP taking these medications    amoxicillin 500 MG tablet Commonly known as: AMOXIL   diclofenac Sodium 1 % Gel Commonly known as: VOLTAREN   hydrOXYzine 50 MG tablet Commonly known as: ATARAX/VISTARIL   polyethylene glycol 17 g packet Commonly known as: MIRALAX / GLYCOLAX   senna 8.6 MG tablet Commonly known as: SENOKOT   traZODone 50 MG tablet Commonly known as: DESYREL       TAKE these medications    acetaminophen 500 MG tablet Commonly known as: TYLENOL Take 500 mg by mouth every 6 (six) hours as needed.   amLODipine 10 MG tablet Commonly known as: NORVASC Take 1 tablet by mouth daily.   amoxicillin-clavulanate 875-125 MG tablet Commonly known as: Augmentin Take 1 tablet by mouth 2 (two) times daily for 4  days. Start taking on: April 05, 2021   ARIPiprazole 5 MG tablet Commonly known as: Abilify Take 1 tablet (5 mg total) by mouth daily.   Artificial Tears 1.4 % ophthalmic solution Generic drug: polyvinyl alcohol Place 2 drops into both eyes daily.   Aspirin Low Dose 81 MG chewable tablet Generic drug: aspirin Chew 81 mg by mouth daily. What changed: Another medication with the same name was removed. Continue taking this medication, and follow the directions you see here.   atorvastatin 20 MG tablet Commonly known as: LIPITOR Take 20 mg by mouth daily.   clonazePAM 0.5 MG tablet Commonly known as: KLONOPIN Take 1 mg by mouth in the morning, at noon, and at bedtime.   DULoxetine 60 MG capsule Commonly known as: CYMBALTA Take 90 mg by mouth daily.   fluticasone 50 MCG/ACT nasal spray Commonly known as: FLONASE Place into both nostrils daily.   gabapentin 300 MG capsule Commonly known as: NEURONTIN Take 900 mg by mouth at bedtime. What changed: Another medication with the same name was removed. Continue taking this medication, and follow the directions you see here.   hydrocortisone cream 0.5 % Apply 1 application topically 2 (two) times daily.   lisinopril 40 MG tablet Commonly known as: ZESTRIL Take 40 mg by mouth daily.   tamsulosin 0.4 MG Caps capsule Commonly known as: FLOMAX Take 1 capsule (0.4 mg total) by mouth daily.        No Known Allergies  Consultations: None  Procedures/Studies: DG Chest 1 View  Result Date: 04/02/2021 CLINICAL DATA:  75 year old male with fall and generalized pain. EXAM: CHEST  1 VIEW COMPARISON:  None. FINDINGS: There is mild eventration of the left hemidiaphragm. Left lung base atelectasis. Infiltrate is less likely but not excluded. No pleural effusion pneumothorax. Mild cardiomegaly. No acute osseous pathology. IMPRESSION: Mild elevation of the left hemidiaphragm and left lung base atelectasis. Electronically Signed   By:  Elgie Collard M.D.   On: 04/02/2021 20:05   CT Head Wo Contrast  Result Date: 04/02/2021 CLINICAL DATA:  Mental status change, unknown cause. EXAM: CT HEAD WITHOUT CONTRAST TECHNIQUE: Contiguous axial images were obtained from the base of the skull through the vertex without intravenous contrast. COMPARISON:  Prior head CT examinations 01/16/2021 and earlier. Brain MRI 04/12/2020. FINDINGS: Brain: Redemonstrated chronic cortical/subcortical infarct within the right parietal, occipital and temporal lobes (right PCA and to a lesser degree right MCA, territories). Associated ex vacuo dilatation of the right lateral ventricle. Background mild generalized cerebral atrophy. Mild patchy and ill-defined hypoattenuation within the cerebral white matter, nonspecific but compatible with chronic small vessel ischemic disease. There is no acute intracranial hemorrhage. No acute demarcated cortical infarct. No extra-axial fluid collection. No evidence of intracranial mass. No midline shift. Vascular: No hyperdense vessel.  Atherosclerotic calcifications Skull: Normal. Negative for fracture or focal lesion. Sinuses/Orbits: Streak artifact arising from a metallic focus in the left periorbital region limits evaluation of the left orbit. No acute finding within the right orbit. Trace mucosal thickening within the bilateral ethmoid sinuses. Other: Redemonstrated sequela of prior left mastoidectomy. IMPRESSION: No evidence of acute intracranial abnormality. Redemonstrated chronic cortical/subcortical infarct within the right parietal, occipital and temporal lobes. Stable background mild generalized cerebral atrophy and chronic small vessael ischemic disease. Electronically Signed   By: Jackey Loge DO   On: 04/02/2021 14:29   CT Renal Stone Study  Result Date: 04/02/2021 CLINICAL DATA:  Status post fall last night. EXAM: CT ABDOMEN AND PELVIS WITHOUT CONTRAST TECHNIQUE: Multidetector CT imaging of the abdomen and pelvis was  performed following the standard protocol without IV contrast. COMPARISON:  None. FINDINGS: Lower chest: There is cardiomegaly. No pleural or pericardial effusion. Lung bases are clear. Hepatobiliary: No focal liver abnormality is seen. No gallstones, gallbladder wall thickening, or biliary dilatation. Pancreas: Unremarkable. No pancreatic ductal dilatation or surrounding inflammatory changes. Spleen: Normal in size without focal abnormality. Adrenals/Urinary Tract: The adrenal glands are negative. There is a 0.7 cm nonobstructing stone in the upper pole of the left kidney. A 1.4 cm hyperdense lesion off the upper pole of the left kidney has density units of approximately 49. The kidneys are otherwise negative. Ureters and urinary bladder appear normal. Stomach/Bowel: Stomach is within normal limits. The appendix is not seen but no evidence of appendicitis is identified. No evidence of bowel wall thickening, distention, or inflammatory changes. Descending and sigmoid colon diverticulosis noted Vascular/Lymphatic: No significant vascular findings are present. No enlarged abdominal or pelvic lymph nodes. Reproductive: Prostate is unremarkable. Other: Negative. Musculoskeletal: No acute or focal abnormality. Convex left scoliosis and degenerative disease L3-4 and L4-5 noted. IMPRESSION: No acute abnormality abdomen or pelvis. Nonspecific 1.4 cm hyperdense lesion left kidney. Nonemergent MRI with and without contrast is recommended for further evaluation. 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 Nonobstructing 0.7 cm stone left kidney. Electronically Signed   By: Drusilla Kanner M.D.   On: 04/02/2021 14:29   (Echo, Carotid, EGD, Colonoscopy, ERCP)    Subjective: Seen and examined the day of discharge.  Stable, no distress.  Stable for discharge back to assisted living.  Discharge Exam: Vitals:   04/04/21 0844 04/04/21 1136  BP: (!) 129/102 119/62  Pulse: 93 72  Resp: 18 18  Temp: 98.4 F (36.9 C) 97.9 F  (36.6 C)  SpO2: 97% 99%   Vitals:   04/03/21 1947 04/04/21 0509 04/04/21 0844 04/04/21 1136  BP: 125/89 130/74 (!) 129/102 119/62  Pulse: 83 68 93 72  Resp: 20 18 18 18   Temp: 98 F (36.7 C) 98.2 F (36.8 C) 98.4 F (36.9 C) 97.9 F (36.6 C)  TempSrc: Oral Oral Oral Oral  SpO2: 98% 98% 97% 99%  Weight:      Height:        General: Pt is alert, awake, not in acute distress Cardiovascular: RRR, S1/S2 +, no rubs, no gallops Respiratory: CTA bilaterally, no wheezing, no rhonchi Abdominal: Soft, NT, ND, bowel sounds + Extremities: no edema, no cyanosis    The results of significant diagnostics from this hospitalization (including imaging, microbiology, ancillary and laboratory) are listed below for reference.     Microbiology: Recent Results (from the past 240 hour(s))  Urine culture     Status: Abnormal (Preliminary result)   Collection Time: 04/02/21  2:59 PM   Specimen: Urine, Random  Result Value Ref Range Status   Specimen Description   Final    URINE, RANDOM Performed at [facility], [address] [place], [place]    Special Requests   Final    NONE Performed at [facility], [address], [place], [place]    Culture (A)  Final    >=100,000 COLONIES/mL ENTEROCOCCUS FAECALIS SUSCEPTIBILITIES TO FOLLOW Performed at [facility], [address], [place], [place]    Report Status PENDING  Incomplete  Resp Panel by RT-PCR (Flu A&B, Covid) Nasopharyngeal Swab     Status: None   Collection Time: 04/02/21  5:13 PM   Specimen: Nasopharyngeal Swab; Nasopharyngeal(NP) swabs in vial transport medium  Result Value Ref Range Status   SARS Coronavirus 2 by RT PCR NEGATIVE NEGATIVE Final    Comment: (NOTE) SARS-CoV-2 target nucleic acids are NOT DETECTED.  The SARS-CoV-2 RNA is generally detectable in upper respiratory specimens during the acute phase of infection. The lowest concentration of SARS-CoV-2 viral  copies this assay can detect is 138 copies/mL. A negative result does not preclude SARS-Cov-2 infection and should not be used as the sole basis for treatment or other patient management decisions. A negative result may occur with  improper specimen collection/handling, submission of specimen other than nasopharyngeal swab, presence of viral mutation(s) within the areas targeted by this assay, and inadequate number of viral copies(<138 copies/mL). A negative result must be combined with clinical observations, patient history, and epidemiological information. The expected result is Negative.  Fact Sheet for Patients:  04/04/21  Fact Sheet for Healthcare Providers:  BloggerCourse.com  This test is no t yet approved or cleared by the [place] FDA and  has been authorized for detection and/or diagnosis of SARS-CoV-2 by FDA under an Emergency Use Authorization (EUA). This EUA will remain  in effect (meaning this test can be used) for the duration of the COVID-19 declaration under Section 564(b)(1) of the Act, 21 U.S.C.section 360bbb-3(b)(1), unless the authorization is terminated  or revoked sooner.       Influenza A by PCR NEGATIVE NEGATIVE Final   Influenza B by PCR NEGATIVE NEGATIVE Final    Comment: (NOTE)  The Xpert Xpress SARS-CoV-2/FLU/RSV plus assay is intended as an aid in the diagnosis of influenza from Nasopharyngeal swab specimens and should not be used as a sole basis for treatment. Nasal washings and aspirates are unacceptable for Xpert Xpress SARS-CoV-2/FLU/RSV testing.  Fact Sheet for Patients: BloggerCourse.com  Fact Sheet for Healthcare Providers: SeriousBroker.it  This test is not yet approved or cleared by the [place] FDA and has been authorized for detection and/or diagnosis of SARS-CoV-2 by FDA under an Emergency Use Authorization (EUA). This  EUA will remain in effect (meaning this test can be used) for the duration of the COVID-19 declaration under Section 564(b)(1) of the Act, 21 U.S.C. section 360bbb-3(b)(1), unless the authorization is terminated or revoked.  Performed at [facility], [address] [place], [place] [address]   Culture, blood (Routine X 2) w Reflex to ID Panel     Status: None (Preliminary result)   Collection Time: 04/02/21  9:19 PM   Specimen: BLOOD  Result Value Ref Range Status   Specimen Description BLOOD BLOOD RIGHT HAND  Final   Special Requests   Final    BOTTLES DRAWN AEROBIC AND ANAEROBIC Blood Culture adequate volume   Culture   Final    NO GROWTH 2 DAYS Performed at [facility], [address] [place], [place] [address]    Report Status PENDING  Incomplete  Culture, blood (Routine X 2) w Reflex to ID Panel     Status: None (Preliminary result)   Collection Time: 04/02/21  9:24 PM   Specimen: BLOOD  Result Value Ref Range Status   Specimen Description BLOOD LEFT ANTECUBITAL  Final   Special Requests   Final    BOTTLES DRAWN AEROBIC AND ANAEROBIC Blood Culture adequate volume   Culture   Final    NO GROWTH 2 DAYS Performed at [facility] [place], [address] [place], [place] [address]    Report Status PENDING  Incomplete     Labs: BNP (last 3 results) No results for input(s): BNP in the last 8760 hours. Basic Metabolic Panel: Recent Labs  Lab 04/02/21 1016 04/03/21 0605  NA 139 142  K 4.2 3.7  CL 105 105  CO2 29 28  GLUCOSE 132* 93  BUN 10 12  CREATININE 0.94 0.76  CALCIUM 9.3 9.0  MG  --  1.7   Liver Function Tests: Recent Labs  Lab 04/02/21 1016 04/03/21 0605  AST 22 14*  ALT 15 13  ALKPHOS 143* 125  BILITOT 0.7 0.7  PROT 6.9 6.3*  ALBUMIN 4.1 3.5   No results for input(s): LIPASE, AMYLASE in the last 168 hours. No results for input(s): AMMONIA in the last 168 hours. CBC: Recent Labs  Lab 04/02/21 1016 04/03/21 0605  WBC  6.9 5.8  NEUTROABS  --  2.7  HGB 12.8* 11.9*  HCT 37.3* 34.8*  MCV 85.9 85.1  PLT 275 238   Cardiac Enzymes: No results for input(s): CKTOTAL, CKMB, CKMBINDEX, TROPONINI in the last 168 hours. BNP: Invalid input(s): POCBNP CBG: No results for input(s): GLUCAP in the last 168 hours. D-Dimer No results for input(s): DDIMER in the last 72 hours. Hgb A1c No results for input(s): HGBA1C in the last 72 hours. Lipid Profile No results for input(s): CHOL, HDL, LDLCALC, TRIG, CHOLHDL, LDLDIRECT in the last 72 hours. Thyroid function studies Recent Labs    04/03/21 0605  TSH 0.236*   Anemia work up Recent Labs    04/03/21 0605  VITAMINB12 181  FOLATE 7.0   Urinalysis    Component Value Date/Time   COLORURINE YELLOW (A) 04/02/2021 1459   APPEARANCEUR HAZY (A) 04/02/2021 1459   APPEARANCEUR Cloudy (A) 08/09/2020 1515   LABSPEC 1.012 04/02/2021 1459   PHURINE 6.0 04/02/2021 1459   GLUCOSEU NEGATIVE 04/02/2021 1459   HGBUR NEGATIVE 04/02/2021 1459   BILIRUBINUR NEGATIVE 04/02/2021 1459   BILIRUBINUR Negative 08/09/2020 1515   KETONESUR NEGATIVE 04/02/2021 1459   PROTEINUR NEGATIVE 04/02/2021 1459   NITRITE NEGATIVE 04/02/2021 1459   LEUKOCYTESUR LARGE (A) 04/02/2021 1459   Sepsis Labs Invalid input(s): PROCALCITONIN,  WBC,  LACTICIDVEN Microbiology Recent Results (from the past 240 hour(s))  Urine culture     Status: Abnormal (Preliminary result)   Collection Time: 04/02/21  2:59 PM   Specimen: Urine, Random  Result Value Ref Range Status   Specimen Description   Final    URINE, RANDOM Performed at [facility] [place], [address] [place], [place] [address]    Special Requests   Final    NONE Performed at [facility], [address], [place], [place] [address]    Culture (A)  Final    >=100,000 COLONIES/mL ENTEROCOCCUS FAECALIS SUSCEPTIBILITIES TO FOLLOW Performed at [facility], [address] [place], [place] [address]    Report  Status PENDING  Incomplete  Resp Panel by RT-PCR (Flu A&B, Covid) Nasopharyngeal Swab     Status: None   Collection Time: 04/02/21  5:13 PM   Specimen: Nasopharyngeal Swab; Nasopharyngeal(NP) swabs in vial transport medium  Result Value Ref Range Status   SARS Coronavirus 2 by RT PCR NEGATIVE NEGATIVE Final    Comment: (NOTE) SARS-CoV-2 target nucleic acids are NOT DETECTED.  The SARS-CoV-2 RNA is generally detectable in upper respiratory specimens during the acute phase of infection. The lowest concentration of SARS-CoV-2 viral copies this assay can detect is 138 copies/mL. A negative result does not preclude SARS-Cov-2 infection and should not be used as the sole basis for treatment or other patient management decisions. A negative result may occur with  improper specimen collection/handling, submission of specimen other than nasopharyngeal swab, presence of viral mutation(s) within the areas targeted by this assay, and inadequate number of viral copies(<138 copies/mL). A negative result must be combined with clinical observations, patient history, and epidemiological information. The expected result is Negative.  Fact Sheet for Patients:  BloggerCourse.com  Fact Sheet for Healthcare Providers:  SeriousBroker.it  This test is no t yet approved or cleared by the [place] FDA and  has been authorized for detection and/or diagnosis of SARS-CoV-2 by FDA under an Emergency Use Authorization (EUA). This EUA will remain  in effect (meaning this test can be used) for the duration of the COVID-19 declaration under Section 564(b)(1) of the Act, 21 U.S.C.section 360bbb-3(b)(1), unless the authorization is terminated  or revoked sooner.       Influenza A by PCR NEGATIVE NEGATIVE Final   Influenza B by PCR NEGATIVE NEGATIVE Final    Comment: (NOTE) The Xpert Xpress SARS-CoV-2/FLU/RSV plus assay is intended as an aid in the diagnosis  of influenza from Nasopharyngeal swab specimens and should not be used as a sole basis for treatment. Nasal washings and aspirates are unacceptable for Xpert Xpress SARS-CoV-2/FLU/RSV testing.  Fact Sheet for Patients: BloggerCourse.com  Fact Sheet for Healthcare Providers: SeriousBroker.it  This test is not yet approved or cleared by the [place] FDA and has been authorized for detection and/or diagnosis of SARS-CoV-2 by FDA under an  Emergency Use Authorization (EUA). This EUA will remain in effect (meaning this test can be used) for the duration of the COVID-19 declaration under Section 564(b)(1) of the Act, 21 U.S.C. section 360bbb-3(b)(1), unless the authorization is terminated or revoked.  Performed at [facility], [address] [place], [place] [address]   Culture, blood (Routine X 2) w Reflex to ID Panel     Status: None (Preliminary result)   Collection Time: 04/02/21  9:19 PM   Specimen: BLOOD  Result Value Ref Range Status   Specimen Description BLOOD BLOOD RIGHT HAND  Final   Special Requests   Final    BOTTLES DRAWN AEROBIC AND ANAEROBIC Blood Culture adequate volume   Culture   Final    NO GROWTH 2 DAYS Performed at [facility], [address] [place], [place] [address]    Report Status PENDING  Incomplete  Culture, blood (Routine X 2) w Reflex to ID Panel     Status: None (Preliminary result)   Collection Time: 04/02/21  9:24 PM   Specimen: BLOOD  Result Value Ref Range Status   Specimen Description BLOOD LEFT ANTECUBITAL  Final   Special Requests   Final    BOTTLES DRAWN AEROBIC AND ANAEROBIC Blood Culture adequate volume   Culture   Final    NO GROWTH 2 DAYS Performed at [facility], [address], [place], [place] [address]    Report Status PENDING  Incomplete     Time coordinating discharge: Over 30 minutes  SIGNED:   [name], MD  [facility] 04/04/2021, 11:38 AM Pager   If 7PM-7AM, please contact night-coverage

## 2021-04-04 NOTE — Evaluation (Signed)
Physical Therapy Evaluation Patient Details Name: Johnny Rice MRN: [id number] DOB: 1946-05-08 Today's Date: 04/04/2021   History of Present Illness  75 year old male with past history of previous CVA with chronic left facial droop, hypertension, major depressive disorder, benign prostatic hyperplasia, hyperlipidemia who presents to [facility] emergency department from his assisted  Facility status post fall via EMS.   Clinical Impression  Patient received in bed sleeping. Rouses to my touch and loudly calling name. Patient agreeable to PT assessment. Requires cues for safety and waiting until I am ready with lines and equipment. Patient is able to perform bed mobility with mod independence, transfers with min guard, ambulated 150 feet with RW and min guard. No lob. Fatigued with this. Patient will continue to benefit from skilled PT while here to improve strength and activity tolerance for safe return home.         Follow Up Recommendations Home health PT    Equipment Recommendations  None recommended by PT    Recommendations for Other Services       Precautions / Restrictions Precautions Precautions: Fall Restrictions Weight Bearing Restrictions: No      Mobility  Bed Mobility Overal bed mobility: Modified Independent                  Transfers Overall transfer level: Needs assistance Equipment used: Rolling walker (2 wheeled) Transfers: Sit to/from Stand Sit to Stand: Supervision            Ambulation/Gait Ambulation/Gait assistance: Min guard Gait Distance (Feet): 150 Feet Assistive device: Rolling walker (2 wheeled) Gait Pattern/deviations: Step-through pattern;Decreased step length - right;Decreased step length - left;Trunk flexed;Decreased stride length Gait velocity: decr   General Gait Details: patient ambulated well with RW and min guard. Occasionally running into obstacles in path, but no lob.  Stairs            Wheelchair Mobility    Modified  Rankin (Stroke Patients Only)       Balance Overall balance assessment: Needs assistance Sitting-balance support: Feet supported Sitting balance-Leahy Scale: Fair Sitting balance - Comments: supervision for safety   Standing balance support: Bilateral upper extremity supported;During functional activity Standing balance-Leahy Scale: Fair Standing balance comment: reliant on B UE support for ambulation. He did take a couple of steps without AD to go back to bed and was fairly steady.                             Pertinent Vitals/Pain Pain Assessment: No/denies pain    Home Living Family/patient expects to be discharged to:: Assisted living               Home Equipment: Walker - 4 wheels      Prior Function Level of Independence: Independent with assistive device(s)         Comments: Patient has had frequent falls. uses rollator at baseline     Hand Dominance        Extremity/Trunk Assessment   Upper Extremity Assessment Upper Extremity Assessment: Generalized weakness    Lower Extremity Assessment Lower Extremity Assessment: Generalized weakness    Cervical / Trunk Assessment Cervical / Trunk Assessment: Normal  Communication   Communication: HOH  Cognition Arousal/Alertness: Lethargic Behavior During Therapy: WFL for tasks assessed/performed Overall Cognitive Status: Within Functional Limits for tasks assessed  General Comments: Patient reports he has just been so sleepy      General Comments      Exercises     Assessment/Plan    PT Assessment Patient needs continued PT services  PT Problem List Decreased strength;Decreased mobility;Decreased activity tolerance;Decreased safety awareness       PT Treatment Interventions Gait training;Functional mobility training;Therapeutic activities;Patient/family education;Therapeutic exercise    PT Goals (Current goals can be found in the Care  Plan section)  Acute Rehab PT Goals Patient Stated Goal: to return home PT Goal Formulation: With patient Time For Goal Achievement: 04/11/21 Potential to Achieve Goals: Good    Frequency Min 2X/week   Barriers to discharge        Co-evaluation               AM-PAC PT "6 Clicks" Mobility  Outcome Measure Help needed turning from your back to your side while in a flat bed without using bedrails?: None Help needed moving from lying on your back to sitting on the side of a flat bed without using bedrails?: None Help needed moving to and from a bed to a chair (including a wheelchair)?: A Little Help needed standing up from a chair using your arms (e.g., wheelchair or bedside chair)?: A Little Help needed to walk in hospital room?: A Little Help needed climbing 3-5 steps with a railing? : A Lot 6 Click Score: 19    End of Session Equipment Utilized During Treatment: Gait belt Activity Tolerance: Patient tolerated treatment well Patient left: in bed;with call bell/phone within reach;with bed alarm set Nurse Communication: Mobility status PT Visit Diagnosis: Repeated falls (R29.6);Muscle weakness (generalized) (M62.81)    Time: 5462-7035 PT Time Calculation (min) (ACUTE ONLY): 16 min   Charges:   PT Evaluation $PT Eval Moderate Complexity: 1 Mod          Taisia Fantini, PT, GCS 04/04/21,11:06 AM

## 2021-04-05 LAB — URINE CULTURE: Culture: >=100000 — AB

## 2021-04-07 LAB — CULTURE, BLOOD (ROUTINE X 2)
Culture: NO GROWTH
Culture: NO GROWTH
Special Requests: ADEQUATE
Special Requests: ADEQUATE

## 2021-05-22 ENCOUNTER — Telehealth: Payer: Self-pay

## 2021-05-22 ENCOUNTER — Ambulatory Visit (INDEPENDENT_AMBULATORY_CARE_PROVIDER_SITE_OTHER): Payer: Medicare Other | Admitting: Urology

## 2021-05-22 ENCOUNTER — Other Ambulatory Visit: Payer: Self-pay

## 2021-05-22 ENCOUNTER — Encounter: Payer: Self-pay | Admitting: Urology

## 2021-05-22 VITALS — BP 153/74 | HR 63 | Ht 68.0 in | Wt 195.7 lb

## 2021-05-22 DIAGNOSIS — N390 Urinary tract infection, site not specified: Secondary | ICD-10-CM

## 2021-05-22 DIAGNOSIS — N3281 Overactive bladder: Secondary | ICD-10-CM

## 2021-05-22 DIAGNOSIS — Z87440 Personal history of urinary (tract) infections: Secondary | ICD-10-CM | POA: Diagnosis not present

## 2021-05-22 DIAGNOSIS — N401 Enlarged prostate with lower urinary tract symptoms: Secondary | ICD-10-CM | POA: Diagnosis not present

## 2021-05-22 DIAGNOSIS — N138 Other obstructive and reflux uropathy: Secondary | ICD-10-CM

## 2021-05-22 DIAGNOSIS — K5904 Chronic idiopathic constipation: Secondary | ICD-10-CM | POA: Diagnosis not present

## 2021-05-22 LAB — URINALYSIS, COMPLETE
Bilirubin, UA: NEGATIVE
Glucose, UA: NEGATIVE
Ketones, UA: NEGATIVE
Nitrite, UA: NEGATIVE
Protein,UA: NEGATIVE
Specific Gravity, UA: 1.015 (ref 1.005–1.030)
Urobilinogen, Ur: 0.2 mg/dL (ref 0.2–1.0)
pH, UA: 6 (ref 5.0–7.5)

## 2021-05-22 LAB — MICROSCOPIC EXAMINATION

## 2021-05-22 LAB — BLADDER SCAN AMB NON-IMAGING

## 2021-05-22 MED ORDER — DOCUSATE SODIUM 100 MG PO CAPS: 100.0000 mg | ORAL_CAPSULE | Freq: Every day | ORAL | 3 refills | Status: AC

## 2021-05-22 MED ORDER — DOXYCYCLINE HYCLATE 100 MG PO CAPS: 100.0000 mg | ORAL_CAPSULE | Freq: Two times a day (BID) | ORAL | 0 refills | Status: AC

## 2021-05-22 MED ORDER — NITROFURANTOIN MACROCRYSTAL 50 MG PO CAPS
50.0000 mg | ORAL_CAPSULE | Freq: Every day | ORAL | 0 refills | Status: DC
Start: 2021-05-22 — End: 2022-04-23

## 2021-05-22 NOTE — Telephone Encounter (Signed)
Incoming call from pt's nursing home [facility] [place] clarifying if this patient needs to d/c Clindamycin that he is taking from the dentist as we have prescribed doxycycline. Per verbal from Dr. Richardo Hanks patient needs to continue both antibiotics until completion. Nurse gave verbal understanding.

## 2021-05-22 NOTE — Progress Notes (Signed)
   05/22/2021 12:53 PM   Johnny Rice 1946-09-01 [id number]  Reason for visit: Follow up BPH, recurrent UTIs, weakness  HPI: He is a very frail and comorbid feeling 75 year old male with history of stroke who lives in assisted living facility.  I originally saw him in September 2021 for urge incontinence, urgency/frequency, weak stream, and nocturia.  At that visit he had a staph and Enterococcus UTI and was treated with antibiotics.  He has had other work-up including a cystoscopy which was essentially normal with a small nonobstructive appearing prostate and cytology that showed no evidence of malignancy.  He has been trialed on Myrbetriq and Flomax without improvement in his urinary symptoms.  I personally viewed and interpreted the CT from June 2022 that shows no hydronephrosis, diverticulosis, nonobstructing 7 mm left upper pole stone, and nonspecific 1.4 cm hyperdense lesion on the left kidney.   He was recently hospitalized for weakness and UTI, as well as depression.  The history is challenging to obtain today, but his primary complaint is weakness as well as mixed urinary symptoms and some urinary and fecal incontinence.  PVR is normal today at 130 mL.  Urinalysis concerning for infection with 11-30 WBCs, 0-2 RBCs, few bacteria, nitrite negative, 2+ leukocytes.  Will send for culture.  -Recommend discontinuing Flomax in setting of weakness and without significant improvement in his urinary symptoms -Urine sent for culture and atypicals, follow-up results.  Started doxycycline 100 mg twice daily x7 days for possible atypical infection -Colace for constipation -Recommended 3 months of nitrofurantoin prophylaxis -RTC 3 months virtual visit  Sondra Come, MD  [facility] [address] [place], [place] [address] [phone]

## 2021-05-25 LAB — CULTURE, URINE COMPREHENSIVE

## 2021-05-29 LAB — MYCOPLASMA / UREAPLASMA CULTURE
Mycoplasma hominis Culture: NEGATIVE
Ureaplasma urealyticum: NEGATIVE

## 2021-07-09 ENCOUNTER — Other Ambulatory Visit: Payer: Self-pay

## 2021-07-09 ENCOUNTER — Emergency Department: Payer: Medicare Other

## 2021-07-09 ENCOUNTER — Emergency Department
Admission: EM | Admit: 2021-07-09 | Discharge: 2021-07-09 | Disposition: A | Discharge status: Home / Self Care | Payer: Medicare Other | Attending: Emergency Medicine | Admitting: Emergency Medicine

## 2021-07-09 DIAGNOSIS — Z79899 Other long term (current) drug therapy: Secondary | ICD-10-CM | POA: Diagnosis not present

## 2021-07-09 DIAGNOSIS — I10 Essential (primary) hypertension: Secondary | ICD-10-CM | POA: Diagnosis not present

## 2021-07-09 DIAGNOSIS — Z7982 Long term (current) use of aspirin: Secondary | ICD-10-CM | POA: Diagnosis not present

## 2021-07-09 DIAGNOSIS — R531 Weakness: Secondary | ICD-10-CM | POA: Insufficient documentation

## 2021-07-09 LAB — URINALYSIS, COMPLETE (UACMP) WITH MICROSCOPIC
Bilirubin Urine: NEGATIVE
Glucose, UA: NEGATIVE mg/dL
Hgb urine dipstick: NEGATIVE
Ketones, ur: NEGATIVE mg/dL
Nitrite: NEGATIVE
Protein, ur: NEGATIVE mg/dL
Specific Gravity, Urine: 1.01 (ref 1.005–1.030)
Squamous Epithelial / HPF: NONE SEEN (ref 0–5)
pH: 6 (ref 5.0–8.0)

## 2021-07-09 LAB — CBC
HCT: 40.8 % (ref 39.0–52.0)
Hemoglobin: 13.9 g/dL (ref 13.0–17.0)
MCH: 28.1 pg (ref 26.0–34.0)
MCHC: 34.1 g/dL (ref 30.0–36.0)
MCV: 82.4 fL (ref 80.0–100.0)
Platelets: 327 10*3/uL (ref 150–400)
RBC: 4.95 MIL/uL (ref 4.22–5.81)
RDW: 13.2 % (ref 11.5–15.5)
WBC: 6.8 10*3/uL (ref 4.0–10.5)
nRBC: 0 % (ref 0.0–0.2)

## 2021-07-09 LAB — CBG MONITORING, ED: Glucose-Capillary: 104 mg/dL — ABNORMAL HIGH (ref 70–99)

## 2021-07-09 LAB — TROPONIN I (HIGH SENSITIVITY): Troponin I (High Sensitivity): 3 ng/L (ref <18)

## 2021-07-09 LAB — BASIC METABOLIC PANEL
Anion gap: 9 (ref 5–15)
BUN: 11 mg/dL (ref 8–23)
CO2: 27 mmol/L (ref 22–32)
Calcium: 9.7 mg/dL (ref 8.9–10.3)
Chloride: 104 mmol/L (ref 98–111)
Creatinine, Ser: 0.84 mg/dL (ref 0.61–1.24)
GFR, Estimated: >60 mL/min (ref >60)
Glucose, Bld: 103 mg/dL — ABNORMAL HIGH (ref 70–99)
Potassium: 4.1 mmol/L (ref 3.5–5.1)
Sodium: 140 mmol/L (ref 135–145)

## 2021-07-09 MED ORDER — HYDROXYZINE HCL 25 MG PO TABS
25.0000 mg | ORAL_TABLET | Freq: Once | ORAL | Status: AC
  Administered 2021-07-09: 25 mg via ORAL
  Filled 2021-07-09: qty 1

## 2021-07-09 NOTE — ED Notes (Signed)
Pt has new diaper on floor. States it fell off. Other monitoring equipment off. Pt asked to keep diaper and equipment applied.

## 2021-07-09 NOTE — ED Provider Notes (Signed)
Emergency Medicine Provider Triage Evaluation Note  Karsen Nakanishi , a 75 y.o. male  was evaluated in triage.  Pt complains of presents to the ED via EMS from [facility] for c/o weakness and near syncope. He notes dizziness but denies chest pain or SOB.   Review of Systems  Positive: Weakness  Negative: CP, NVD  Physical Exam  BP (!) 149/90   Pulse 67   Temp 98 F (36.7 C)   Resp 18   SpO2 100%  Gen:   Awake, no distress  NAD Resp:  Normal effort CTA MSK:   Moves extremities without difficulty  Other:  ADB, soft, nontender  Medical Decision Making  Medically screening exam initiated at 1:02 PM.  Appropriate orders placed.  Anival Pasha was informed that the remainder of the evaluation will be completed by another provider, this initial triage assessment does not replace that evaluation, and the importance of remaining in the ED until their evaluation is complete.  Patient with ED evaluation of weakness and dizziness.    Lissa Hoard, PA-C 07/09/21 1307    Chesley Noon, MD 07/10/21 1034

## 2021-07-09 NOTE — ED Triage Notes (Signed)
Pt comes with c/o increased weakness and feeling like he was going to pass out. Pt comes from the [facility]. Pt states dizziness for three days or more.  Pt states belly pain. Pt states nausea.

## 2021-07-09 NOTE — ED Notes (Signed)
Pt on call light, requesting blanket

## 2021-07-09 NOTE — Discharge Instructions (Addendum)
Your tests today were all reassuring. Please follow up with your doctor.

## 2021-07-09 NOTE — ED Notes (Signed)
Pt placed on male purewick for urination assistance

## 2021-07-09 NOTE — ED Provider Notes (Signed)
[facility]  ____________________________________________   Event Date/Time   First MD Initiated Contact with Patient 07/09/21 1712     (approximate)  I have reviewed the triage vital signs and the nursing notes.   HISTORY  Chief Complaint Weakness    HPI Montford Barg is a 75 y.o. male CVA with chronic left facial droop, hypertension, major depressive disorder, benign prostatic hyperplasia, hyperlipidemia who presents with generalized malaise.  Patient notes that the last 2 days have been "rough".  He has trouble describing exactly what the issues have been.  Notes that he has felt generally fatigued and weak.  He had several episodes of emesis yesterday but denies ongoing abdominal pain.  Endorses some central chest pain that is intermittent.  Denies dyspnea.  Patient feels globally weak but denies any new focal numbness or weakness.  Patient notes that he has fallen about 5 times in the last week.  Patient does not feel that he is getting treated well at his assisted living facility.         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   Frequent falls 12/19/2016   Anxiety disorder, unspecified 03/12/2016   Insomnia 02/11/2016   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   Cerebrovascular accident (CVA) (HCC) 05/04/2014   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 Authorizing Provider  acetaminophen (TYLENOL) 500 MG tablet Take 500 mg by mouth every 6 (six) hours as needed.    [provider]  Alum & Mag Hydroxide-Simeth (ANTACID LIQUID PO) Take by mouth.    [provider]  amLODipine (NORVASC) 10 MG tablet Take 1 tablet by mouth daily. 03/12/21   [provider]  ARIPiprazole (ABILIFY) 5 MG tablet Take 1 tablet (5 mg total) by mouth daily. 04/02/21   Clapacs, Jackquline Denmark, MD  ARTIFICIAL TEARS 1.4 % ophthalmic solution Place 2 drops into both eyes daily. 03/18/21   [provider]  ASPIRIN LOW DOSE 81 MG chewable tablet Chew 81 mg by mouth daily. 03/12/21   [provider]  atorvastatin (LIPITOR) 20 MG tablet Take 20 mg by mouth daily. 02/23/20   [provider]  clindamycin (CLEOCIN) 300 MG capsule Take 300 mg by mouth every 6 (six) hours.    [provider]  clonazePAM (KLONOPIN) 0.5 MG tablet Take 1 mg by mouth in the morning, at noon, and at bedtime.    [provider]  diphenhydrAMINE (BENADRYL) 25 mg capsule Take 25 mg  by mouth every 6 (six) hours as needed.    [provider]  docusate sodium (COLACE) 100 MG capsule Take 1 capsule (100 mg total) by mouth daily. 05/22/21   Sondra Come, MD  DULoxetine (CYMBALTA) 60 MG capsule Take 90 mg by mouth daily.    [provider]  ergocalciferol (VITAMIN D2) 1.25 MG (50000 UT) capsule Take 50,000 Units by mouth once a week.    [provider]  fluticasone (FLONASE) 50 MCG/ACT nasal spray Place into both nostrils daily.    [provider]  gabapentin (NEURONTIN) 300 MG capsule Take 900 mg by mouth at bedtime.    [provider]  guaifenesin (ROBITUSSIN) 100 MG/5ML syrup Take 200 mg by mouth 3 (three) times daily as needed for cough.    [provider]  hydrocortisone cream 0.5 % Apply 1 application topically 2 (two) times daily.    [provider]  hydrOXYzine (VISTARIL) 50 MG capsule Take 50 mg by mouth 3 (three) times daily as needed.    [provider]  ibuprofen (ADVIL) 800 MG tablet Take 800 mg by mouth every 8 (eight) hours as needed.    [provider]  liothyronine (CYTOMEL) 25 MCG tablet Take 25 mcg by mouth daily.    [provider]  lisinopril (ZESTRIL) 40 MG tablet Take 40 mg by mouth daily.    [provider]  loperamide (IMODIUM) 2 MG capsule Take by mouth as needed for diarrhea or loose stools.    [provider]  magnesium hydroxide (MILK OF MAGNESIA) 400 MG/5ML suspension Take by mouth daily as needed for mild constipation.    [provider]  meloxicam (MOBIC) 15 MG tablet Take 15 mg by mouth daily.    [provider]  metoprolol succinate (TOPROL-XL) 25 MG 24 hr tablet Take 25 mg by mouth daily.    [provider]  nitrofurantoin (MACRODANTIN) 50 MG capsule Take 1 capsule (50 mg total) by mouth daily. Begin after completed after doxycycline 05/22/21   Sondra Come, MD  polyethylene glycol powder (GAVILAX) 17 GM/SCOOP powder Take 1 Container by mouth once.    [provider]  traZODone (DESYREL) 50 MG tablet Take 50 mg by mouth at bedtime.    [provider]    Allergies Patient has no known allergies.  Family History  Problem Relation Age of Onset   Heart disease Neg Hx     Social History Social History   Tobacco Use   Smoking status: Never   Smokeless tobacco:  Never  Substance Use Topics   Alcohol use: Not Currently   Drug use: Not Currently    Review of Systems   Review of Systems  Constitutional:  Positive for fatigue. Negative for appetite change, chills and fever.  Respiratory:  Positive for chest tightness. Negative for shortness of breath.   Cardiovascular:  Positive for chest pain. Negative for palpitations and leg swelling.  Gastrointestinal:  Positive for nausea and vomiting. Negative for abdominal pain, constipation and diarrhea.  Genitourinary:  Positive for difficulty urinating. Negative for dysuria.  Neurological:  Positive for weakness and headaches. Negative for light-headedness and numbness.  All other systems reviewed and are negative.  Physical Exam Updated Vital Signs BP (!) 161/99   Pulse 75   Temp 98 F (36.7 C)   Resp 16   SpO2 100%   Physical Exam Vitals and nursing note reviewed.  Constitutional:      General: He is not  in acute distress.    Appearance: Normal appearance.     Comments: Appears chronically ill  HENT:     Head: Normocephalic and atraumatic.     Nose: Nose normal.  Eyes:     General: No scleral icterus.    Conjunctiva/sclera: Conjunctivae normal.  Pulmonary:     Effort: Pulmonary effort is normal. No respiratory distress.     Breath sounds: Normal breath sounds. No wheezing.  Abdominal:     General: Abdomen is flat. There is no distension.     Palpations: Abdomen is soft.     Tenderness: There is no abdominal tenderness. There is no guarding.  Musculoskeletal:        General: No deformity or signs of injury.     Cervical back: Normal range of motion.  Skin:    Capillary Refill: Capillary refill takes less than 2 seconds.     Coloration: Skin is not jaundiced or pale.  Neurological:     Mental Status: He is alert and oriented to person, place, and time. Mental status is at baseline.     Comments: Chronic left-sided facial droop, left arm is weak compared to right, 5 out of 5 strength  in bilateral upper extremities, finger-nose-finger intact bilaterally  Psychiatric:        Mood and Affect: Mood normal.        Behavior: Behavior normal.     LABS (all labs ordered are listed, but only abnormal results are displayed)  Labs Reviewed  BASIC METABOLIC PANEL - Abnormal; Notable for the following components:      Result Value   Glucose, Bld 103 (*)    All other components within normal limits  CBG MONITORING, ED - Abnormal; Notable for the following components:   Glucose-Capillary 104 (*)    All other components within normal limits  CBC  URINALYSIS, COMPLETE (UACMP) WITH MICROSCOPIC  TROPONIN I (HIGH SENSITIVITY)   ____________________________________________  EKG  Normal sinus rhythm, normal axis, normal intervals, no acute ischemic changes ____________________________________________  RADIOLOGY I, Randol Kern, personally viewed and evaluated these images (plain radiographs) as part of my medical decision making, as well as reviewing the written report by the radiologist.  ED MD interpretation: I reviewed the CT head which is negative for acute intracranial pathology     ____________________________________________   PROCEDURES  Procedure(s) performed (including Critical Care):  Procedures   ____________________________________________   INITIAL IMPRESSION / ASSESSMENT AND PLAN / ED COURSE     75 year old male presenting from assisted living facility with vague symptoms of malaise and weakness.  There is really no focality to his symptoms and he has difficult  time describing exactly what is going on.  Chronic left-sided weakness but his exam otherwise is nonfocal.  Abdomen is benign.  Vital signs notable for hypertension but otherwise within normal limits.  Obtained an EKG which is nonischemic.  Troponin is negative.  Labs are also reassuring.  CT head was obtained which is negative for acute pathology.  Will obtain a UA.  Patient clearly does  not wish to go back to the nursing facility.  However I do not have any objective reason to admit him to the hospital.  He seems to be at his baseline in terms of neurologic status.  At the time of discharge he is pending a UA.  Clinical Course as of 07/09/21 2201  Tue Jul 09, 2021  1854 Troponin I (High Sensitivity): 3 [KM]    Clinical Course User Index [KM]  Georga Hacking, MD     ____________________________________________   FINAL CLINICAL IMPRESSION(S) / ED DIAGNOSES  Final diagnoses:  None     ED Discharge Orders     None        Note:  This document was prepared using Dragon voice recognition software and may include unintentional dictation errors.    Georga Hacking, MD 07/09/21 2201

## 2021-07-09 NOTE — ED Notes (Signed)
Pt has giant BM. Pt Cleaned. New diaper and sheets applied

## 2021-07-09 NOTE — ED Triage Notes (Signed)
First Nurse note:  Arrives via [facility] c/o weakness, nausea x 3 days.  Per report.  VS wnl.  CBG:  143.

## 2021-07-09 NOTE — ED Notes (Signed)
Pt on call light, stating he cannot urinate. Pt placed back on monitoring equipment, into bed with rails up.

## 2021-07-09 NOTE — ED Notes (Signed)
Pt sitting on side of bed, monitoring equip off, blankets off. Pt states he needs blankets

## 2021-07-09 NOTE — ED Notes (Signed)
Spoke to pt daughter, states she cannot pick pt up for DC. CN made aware, Unit secretary asked to make arrangements for [facility] to transport home

## 2021-07-09 NOTE — ED Notes (Signed)
Pt found walking to doorway, stating he has to use restroom. Pt assisted back to bed, given urinal.

## 2021-07-09 NOTE — ED Notes (Signed)
Pt NAD, a/ox4. Pt verbalizes understanding of all DC and f/u instructions. All questions answered. Pt assisted onto EMS gurney where he is to be transported back to his assisted living facility [facility].

## 2021-07-09 NOTE — ED Notes (Signed)
Report to medics at bedside and called to [employer] at [employer]

## 2021-07-09 NOTE — ED Notes (Signed)
Pt on call light, out of bed, stating he needs to urinate and feels anxious. Pt reminded he is on purewick. Assisted into bed

## 2021-08-10 ENCOUNTER — Other Ambulatory Visit: Payer: Self-pay

## 2021-08-10 ENCOUNTER — Emergency Department: Payer: Medicare Other

## 2021-08-10 ENCOUNTER — Emergency Department
Admission: EM | Admit: 2021-08-10 | Discharge: 2021-08-11 | Disposition: A | Discharge status: Home / Self Care | Payer: Medicare Other | Attending: Emergency Medicine | Admitting: Emergency Medicine

## 2021-08-10 DIAGNOSIS — R42 Dizziness and giddiness: Secondary | ICD-10-CM | POA: Diagnosis present

## 2021-08-10 DIAGNOSIS — R519 Headache, unspecified: Secondary | ICD-10-CM | POA: Diagnosis not present

## 2021-08-10 DIAGNOSIS — Z7982 Long term (current) use of aspirin: Secondary | ICD-10-CM | POA: Diagnosis not present

## 2021-08-10 DIAGNOSIS — I10 Essential (primary) hypertension: Secondary | ICD-10-CM | POA: Diagnosis not present

## 2021-08-10 DIAGNOSIS — E860 Dehydration: Secondary | ICD-10-CM | POA: Insufficient documentation

## 2021-08-10 DIAGNOSIS — R5381 Other malaise: Secondary | ICD-10-CM | POA: Diagnosis not present

## 2021-08-10 DIAGNOSIS — Z20822 Contact with and (suspected) exposure to covid-19: Secondary | ICD-10-CM | POA: Diagnosis not present

## 2021-08-10 DIAGNOSIS — Z79899 Other long term (current) drug therapy: Secondary | ICD-10-CM | POA: Diagnosis not present

## 2021-08-10 LAB — URINALYSIS, COMPLETE (UACMP) WITH MICROSCOPIC
Bacteria, UA: NONE SEEN
Bilirubin Urine: NEGATIVE
Glucose, UA: NEGATIVE mg/dL
Hgb urine dipstick: NEGATIVE
Ketones, ur: NEGATIVE mg/dL
Leukocytes,Ua: NEGATIVE
Nitrite: NEGATIVE
Protein, ur: NEGATIVE mg/dL
Specific Gravity, Urine: 1.031 — ABNORMAL HIGH (ref 1.005–1.030)
Squamous Epithelial / HPF: NONE SEEN (ref 0–5)
pH: 6 (ref 5.0–8.0)

## 2021-08-10 LAB — RESP PANEL BY RT-PCR (FLU A&B, COVID) ARPGX2
Influenza A by PCR: NEGATIVE
Influenza B by PCR: NEGATIVE
SARS Coronavirus 2 by RT PCR: NEGATIVE

## 2021-08-10 LAB — CBC WITH DIFFERENTIAL/PLATELET
Abs Immature Granulocytes: 0.02 10*3/uL (ref 0.00–0.07)
Basophils Absolute: 0.1 10*3/uL (ref 0.0–0.1)
Basophils Relative: 1 %
Eosinophils Absolute: 0.2 10*3/uL (ref 0.0–0.5)
Eosinophils Relative: 2 %
HCT: 39.3 % (ref 39.0–52.0)
Hemoglobin: 13.5 g/dL (ref 13.0–17.0)
Immature Granulocytes: 0 %
Lymphocytes Relative: 34 %
Lymphs Abs: 2.6 10*3/uL (ref 0.7–4.0)
MCH: 28.2 pg (ref 26.0–34.0)
MCHC: 34.4 g/dL (ref 30.0–36.0)
MCV: 82.2 fL (ref 80.0–100.0)
Monocytes Absolute: 0.7 10*3/uL (ref 0.1–1.0)
Monocytes Relative: 9 %
Neutro Abs: 4.1 10*3/uL (ref 1.7–7.7)
Neutrophils Relative %: 54 %
Platelets: 308 10*3/uL (ref 150–400)
RBC: 4.78 MIL/uL (ref 4.22–5.81)
RDW: 12.9 % (ref 11.5–15.5)
WBC: 7.6 10*3/uL (ref 4.0–10.5)
nRBC: 0 % (ref 0.0–0.2)

## 2021-08-10 LAB — COMPREHENSIVE METABOLIC PANEL
ALT: 21 U/L (ref 0–44)
AST: 20 U/L (ref 15–41)
Albumin: 4.3 g/dL (ref 3.5–5.0)
Alkaline Phosphatase: 149 U/L — ABNORMAL HIGH (ref 38–126)
Anion gap: 11 (ref 5–15)
BUN: 18 mg/dL (ref 8–23)
CO2: 26 mmol/L (ref 22–32)
Calcium: 9.3 mg/dL (ref 8.9–10.3)
Chloride: 103 mmol/L (ref 98–111)
Creatinine, Ser: 0.92 mg/dL (ref 0.61–1.24)
GFR, Estimated: >60 mL/min (ref >60)
Glucose, Bld: 100 mg/dL — ABNORMAL HIGH (ref 70–99)
Potassium: 4.4 mmol/L (ref 3.5–5.1)
Sodium: 140 mmol/L (ref 135–145)
Total Bilirubin: 0.5 mg/dL (ref 0.3–1.2)
Total Protein: 7.4 g/dL (ref 6.5–8.1)

## 2021-08-10 MED ORDER — IOHEXOL 350 MG/ML SOLN
75.0000 mL | Freq: Once | INTRAVENOUS | Status: AC | PRN
  Administered 2021-08-10: 75 mL via INTRAVENOUS

## 2021-08-10 MED ORDER — SODIUM CHLORIDE 0.9 % IV BOLUS
1000.0000 mL | Freq: Once | INTRAVENOUS | Status: AC
  Administered 2021-08-10: 1000 mL via INTRAVENOUS

## 2021-08-10 MED ORDER — PROCHLORPERAZINE EDISYLATE 10 MG/2ML IJ SOLN
5.0000 mg | INTRAMUSCULAR | Status: AC
  Administered 2021-08-10: 5 mg via INTRAVENOUS
  Filled 2021-08-10: qty 2

## 2021-08-10 MED ORDER — ACETAMINOPHEN 500 MG PO TABS: 1000.0000 mg | ORAL_TABLET | Freq: Once | ORAL | Status: AC

## 2021-08-10 MED ORDER — ACETAMINOPHEN 500 MG PO TABS
ORAL_TABLET | ORAL | Status: AC
  Administered 2021-08-10: 1000 mg via ORAL
  Filled 2021-08-10: qty 2

## 2021-08-10 MED ORDER — ONDANSETRON 4 MG PO TBDP: 4.0000 mg | ORAL_TABLET | Freq: Three times a day (TID) | ORAL | 0 refills | Status: DC | PRN

## 2021-08-10 NOTE — ED Notes (Signed)
[facility] called to transport back to [facility]

## 2021-08-10 NOTE — ED Triage Notes (Signed)
First Nurse Note:  Arrives from the [facility] [place] via [facility] with 2 day history of headache.  VS wnl.

## 2021-08-10 NOTE — ED Triage Notes (Signed)
Pt via EMS from [facility] [address] Pt c/o headache for the past three days. Pt also c/o neck pain. Pt states he had a fall approx a week ago and did hit his head. Denies any other sx. Pt is poor historian. Pt is HOH. Pt is A&Ox4 and NAD.

## 2021-08-10 NOTE — ED Notes (Signed)
Pt calling nurse on call bell to ask how he would be transported home. Explained once all results and scans are in, if he is discharged, can use EMS transport if necessary. Pt severely HOH and poor historian but states he came to ED for a "HA and panic attack" and that HA is ongoing since 1 week, 7/10 pain rating. VS obtained.

## 2021-08-10 NOTE — ED Provider Notes (Signed)
[facility] Emergency Department Provider Note  ____________________________________________  Time seen: Approximately 11:40 PM  I have reviewed the triage vital signs and the nursing notes.   HISTORY  Chief Complaint Headache    HPI Johnny Rice is a 75 y.o. male with a past history of hypertension and stroke who comes ED complaining of a gradual onset generalized headache for the past 3 days.  He also feels fatigued, somewhat dizzy when he stands up.  He reports poor oral intake over the last 3 days due to loss of appetite.  Also has a feeling like he needs to urinate but has difficulty starting his stream.  Symptoms are waxing and waning, no aggravating or alleviating factors.  No fevers or chills or abdominal pain.    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   Frequent falls 12/19/2016   Anxiety disorder, unspecified 03/12/2016   Insomnia 02/11/2016   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   Cerebrovascular accident (CVA) (HCC) 05/04/2014   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 Authorizing Provider  ondansetron (ZOFRAN ODT) 4 MG disintegrating tablet Take 1 tablet (4 mg total) by mouth every 8 (eight) hours as needed for nausea or vomiting. 08/10/21  Yes Sharman Cheek, MD  acetaminophen (TYLENOL) 500 MG tablet Take 500 mg by mouth every 6 (six) hours as needed.    [provider]  Alum & Mag Hydroxide-Simeth (ANTACID LIQUID PO) Take by mouth.    [provider]  amLODipine (NORVASC) 10 MG tablet Take 1 tablet by mouth daily. 03/12/21   [provider]  ARIPiprazole (ABILIFY) 5 MG tablet Take 1 tablet (5 mg total) by mouth daily. 04/02/21   Clapacs, Jackquline Denmark, MD  ARTIFICIAL TEARS 1.4 % ophthalmic solution Place 2 drops into both eyes daily. 03/18/21   [provider]  ASPIRIN LOW DOSE 81 MG chewable tablet Chew 81 mg by mouth daily. 03/12/21   [provider]  atorvastatin (LIPITOR) 20 MG tablet Take 20 mg by mouth daily. 02/23/20   [provider]  clindamycin (CLEOCIN) 300 MG capsule Take 300 mg by mouth every 6 (six) hours. Patient not taking: Reported on 07/09/2021    [provider]  clonazePAM (KLONOPIN) 0.5 MG tablet Take 1 mg by mouth in the morning, at noon, and at bedtime.    [provider]  diphenhydrAMINE (BENADRYL) 25 mg capsule Take 25 mg by mouth every 6 (six) hours as needed.  [provider]  docusate sodium (COLACE) 100 MG capsule Take 1 capsule (100 mg total) by mouth daily. 05/22/21   Sondra Come, MD  DULoxetine (CYMBALTA) 60 MG capsule Take 90 mg by mouth daily.    [provider]  ergocalciferol (VITAMIN D2) 1.25 MG (50000 UT) capsule Take 50,000 Units by mouth once a week.    [provider]  fluticasone (FLONASE) 50 MCG/ACT nasal spray  Place into both nostrils daily.    [provider]  gabapentin (NEURONTIN) 300 MG capsule Take 900 mg by mouth at bedtime.    [provider]  guaifenesin (ROBITUSSIN) 100 MG/5ML syrup Take 200 mg by mouth 3 (three) times daily as needed for cough. Patient not taking: Reported on 07/09/2021    [provider]  hydrocortisone cream 0.5 % Apply 1 application topically 2 (two) times daily.    [provider]  hydrOXYzine (VISTARIL) 50 MG capsule Take 50 mg by mouth 3 (three) times daily as needed.    [provider]  ibuprofen (ADVIL) 800 MG tablet Take 800 mg by mouth every 8 (eight) hours as needed.    [provider]  liothyronine (CYTOMEL) 25 MCG tablet Take 25 mcg by mouth daily.    [provider]  lisinopril (ZESTRIL) 40 MG tablet Take 40 mg by mouth daily.    [provider]  loperamide (IMODIUM) 2 MG capsule Take by mouth as needed for diarrhea or loose stools.    [provider]  magnesium hydroxide (MILK OF MAGNESIA) 400 MG/5ML suspension Take by mouth daily as needed for mild constipation.    [provider]  meloxicam (MOBIC) 15 MG tablet Take 15 mg by mouth daily.    [provider]  metoprolol succinate (TOPROL-XL) 25 MG 24 hr tablet Take 25 mg by mouth daily. Patient not taking: Reported on 07/09/2021    [provider]  metoprolol tartrate (LOPRESSOR) 25 MG tablet Take 25 mg by mouth 2 (two) times daily. 07/08/21   [provider]  nitrofurantoin (MACRODANTIN) 50 MG capsule Take 1 capsule (50 mg total) by mouth daily. Begin after completed after doxycycline 05/22/21   Sondra Come, MD  polyethylene glycol powder (GLYCOLAX/MIRALAX) 17 GM/SCOOP powder Take 1 Container by mouth once.    [provider]  traZODone (DESYREL) 50 MG tablet Take 50 mg by mouth at bedtime.    [provider]     Allergies Patient has no known allergies.   Family History   Problem Relation Age of Onset   Heart disease Neg Hx     Social History Social History   Tobacco Use   Smoking status: Never   Smokeless tobacco: Never  Substance Use Topics   Alcohol use: Not Currently   Drug use: Not Currently    Review of Systems  Constitutional:   No fever or chills.  ENT:   No sore throat. No rhinorrhea. Cardiovascular:   No chest pain or syncope. Respiratory:   No dyspnea or cough. Gastrointestinal:   Negative for abdominal pain, vomiting and diarrhea.  Musculoskeletal:   Negative for focal pain or swelling All other systems reviewed and are negative except as documented above in ROS and HPI.  ____________________________________________   PHYSICAL EXAM:  VITAL SIGNS: ED Triage Vitals [08/10/21 1631]  Enc Vitals Group     BP 136/88     Pulse Rate 63     Resp 20     Temp 98.4 F (36.9 C)  Temp Source Oral     SpO2 96 %     Weight 180 lb (81.6 kg)     Height 5\' 5"  (1.651 m)     Head Circumference      Peak Flow      Pain Score 8     Pain Loc      Pain Edu?      Excl. in GC?     Vital signs reviewed, nursing assessments reviewed.   Constitutional:   Alert and oriented. Non-toxic appearance. Eyes:   Conjunctivae are normal. EOMI. PERRL. ENT      Head:   Normocephalic and atraumatic.      Nose:   Normal      Mouth/Throat:   Dry mucous membranes      Neck:   No meningismus. Full ROM. Hematological/Lymphatic/Immunilogical:   No cervical lymphadenopathy. Cardiovascular:   RRR. Symmetric bilateral radial and DP pulses.  No murmurs. Cap refill less than 2 seconds. Respiratory:   Normal respiratory effort without tachypnea/retractions. Breath sounds are clear and equal bilaterally. No wheezes/rales/rhonchi. Gastrointestinal:   Soft and nontender. Non distended. There is no CVA tenderness.  No rebound, rigidity, or guarding. Genitourinary:   deferred Musculoskeletal:   Normal range of motion in all extremities. No joint effusions.  No  lower extremity tenderness.  No edema. Neurologic:   Normal speech and language.  Motor grossly intact. No acute focal neurologic deficits are appreciated.  Skin:    Skin is warm, dry and intact. No rash noted.  No petechiae, purpura, or bullae.  ____________________________________________    LABS (pertinent positives/negatives) (all labs ordered are listed, but only abnormal results are displayed) Labs Reviewed  COMPREHENSIVE METABOLIC PANEL - Abnormal; Notable for the following components:      Result Value   Glucose, Bld 100 (*)    Alkaline Phosphatase 149 (*)    All other components within normal limits  URINALYSIS, COMPLETE (UACMP) WITH MICROSCOPIC - Abnormal; Notable for the following components:   Color, Urine STRAW (*)    APPearance CLEAR (*)    Specific Gravity, Urine 1.031 (*)    All other components within normal limits  RESP PANEL BY RT-PCR (FLU A&B, COVID) ARPGX2  CBC WITH DIFFERENTIAL/PLATELET   ____________________________________________   EKG    ____________________________________________    RADIOLOGY  CT ANGIO HEAD NECK W WO CM  Result Date: 08/10/2021 CLINICAL DATA:  Neuro deficit, acute, stroke suspected. EXAM: CT ANGIOGRAPHY HEAD AND NECK TECHNIQUE: Multidetector CT imaging of the head and neck was performed using the standard protocol during bolus administration of intravenous contrast. Multiplanar CT image reconstructions and MIPs were obtained to evaluate the vascular anatomy. Carotid stenosis measurements (when applicable) are obtained utilizing NASCET criteria, using the distal internal carotid diameter as the denominator. CONTRAST:  23mL OMNIPAQUE IOHEXOL 350 MG/ML SOLN COMPARISON:  07/09/2021. FINDINGS: CT HEAD FINDINGS Brain: No focal abnormality affects the brainstem or cerebellum. Old infarction in the right PCA territory affecting the posteromedial temporal lobe and occipital lobe as seen previously. Encephalomalacia with ex vacuo enlargement  of the right lateral ventricle. Elsewhere, the cerebral hemispheres appear normal. No evidence of mass, hemorrhage, obstructive hydrocephalus or extra-axial collection. Vascular: There is atherosclerotic calcification of the major vessels at the base of the brain. Skull: Negative Sinuses: Clear sinuses. Some sort of artifact associated with the left eye. Orbits: Some sort of artifact associated with the left eye. Review of the MIP images confirms the above findings CTA NECK FINDINGS Aortic arch: Aortic atherosclerosis.  No aneurysm. Branching pattern is normal. Right carotid system: Common carotid artery is tortuous but widely patent to the bifurcation. Carotid bifurcation is widely patent. Cervical ICA is tortuous but widely patent. Left carotid system: Common carotid artery is tortuous but widely patent to the bifurcation. Carotid bifurcation is widely patent. Cervical ICA is tortuous but widely patent. Vertebral arteries: Both vertebral artery origins are patent. Both vertebral arteries are patent through the cervical region to the foramen magnum. Skeleton: Ordinary cervical spondylosis and facet osteoarthritis. Other neck: No mass or lymphadenopathy. Upper chest: Scarring in the mid and upper lungs on both sides. Review of the MIP images confirms the above findings CTA HEAD FINDINGS Anterior circulation: Both internal carotid arteries are widely patent through the skull base and siphon regions. There is ordinary siphon atherosclerotic calcification but no stenosis greater than 30%. The anterior and middle cerebral vessels are patent. No large vessel occlusion or correctable proximal stenosis. Posterior circulation: Both vertebral arteries are patent to the basilar. No basilar stenosis. Posterior circulation branch vessels show flow with the exception of chronic distant occlusion of the right PCA. Venous sinuses: Patent and normal. Anatomic variants: None significant. Review of the MIP images confirms the above  findings IMPRESSION: No acute finding. Old right PCA territory infarction with encephalomalacia. Aortic atherosclerosis. No carotid bifurcation stenosis. Tortuous neck vessels consistent with the history of hypertension. Atherosclerotic calcification in the carotid siphon regions but without stenosis of greater than 30%. No intracranial acute large vessel occlusion. Chronic abnormal right PCA related to the old stroke in that distribution. Electronically Signed   By: Paulina Fusi M.D.   On: 08/10/2021 18:01    ____________________________________________   PROCEDURES Procedures  ____________________________________________  DIFFERENTIAL DIAGNOSIS   Cerebral aneurysm, carotid/vertebral artery occlusion, intracranial hemorrhage, dehydration, electrolyte abnormality, UTI, urinary retention  CLINICAL IMPRESSION / ASSESSMENT AND PLAN / ED COURSE  Medications ordered in the ED: Medications  iohexol (OMNIPAQUE) 350 MG/ML injection 75 mL (75 mLs Intravenous Contrast Given 08/10/21 1741)  acetaminophen (TYLENOL) tablet 1,000 mg (1,000 mg Oral Given 08/10/21 2022)  sodium chloride 0.9 % bolus 1,000 mL (1,000 mLs Intravenous New Bag/Given 08/10/21 2248)  prochlorperazine (COMPAZINE) injection 5 mg (5 mg Intravenous Given 08/10/21 2248)    Pertinent labs & imaging results that were available during my care of the patient were reviewed by me and considered in my medical decision making (see chart for details).  Johnny Rice was evaluated in Emergency Department on 08/10/2021 for the symptoms described in the history of present illness. He was evaluated in the context of the global COVID-19 pandemic, which necessitated consideration that the patient might be at risk for infection with the SARS-CoV-2 virus that causes COVID-19. Institutional protocols and algorithms that pertain to the evaluation of patients at risk for COVID-19 are in a state of rapid change based on information released by regulatory  bodies including the CDC and federal and state organizations. These policies and algorithms were followed during the patient's care in the ED.   Patient complains of headache and neck pain.  CT of the head and neck with angiogram are unremarkable.  Labs are unremarkable.  After he spontaneously voided, PVR was checked via bladder catheterization, total about 300.  No significant urinary retention requiring Foley catheter.  No signs of infection.  Patient given IV fluids and low-dose Compazine and feeling better, stable for discharge.  Recommend follow-up with PCP this week.  Doubt stroke, meningitis, encephalitis.  No signs of infection.  Doubt ACS PE dissection  or other acute cardiovascular event.      ____________________________________________   FINAL CLINICAL IMPRESSION(S) / ED DIAGNOSES    Final diagnoses:  Nonintractable headache, unspecified chronicity pattern, unspecified headache type  Mild dehydration  Malaise     ED Discharge Orders          Ordered    ondansetron (ZOFRAN ODT) 4 MG disintegrating tablet  Every 8 hours PRN        08/10/21 2339            Portions of this note were generated with dragon dictation software. Dictation errors may occur despite best attempts at proofreading.    Sharman Cheek, MD 08/10/21 [phone]

## 2021-08-10 NOTE — ED Provider Notes (Signed)
Emergency Medicine Provider Triage Evaluation Note  Johnny Rice , a 75 y.o. male  was evaluated in triage.  Pt complains of 9 out of 10 headache for the past 2 days and anterior neck pain.  Patient is tearful in triage.  He has difficulty providing historical details.  He is uncertain if headache came on slowly or abruptly.  He states that he has fallen in the past but does not remember if he has fallen recently.  He denies chest pain, chest tightness or shortness of breath.  Review of Systems  Positive: Patient has headache and neck pain.  Negative: No chest pain, chest tightness or shortness of breath.   Physical Exam  There were no vitals taken for this visit. Gen:   Awake, tearful.  Resp:  Normal effort  MSK:   Moves extremities without difficulty  Other:    Medical Decision Making  Medically screening exam initiated at 4:30 PM.  Appropriate orders placed.  Johnny Rice was informed that the remainder of the evaluation will be completed by another provider, this initial triage assessment does not replace that evaluation, and the importance of remaining in the ED until their evaluation is complete.     Johnny Mau Lantry, PA-C 08/10/21 1633    Johnny Cheek, MD 08/11/21 [phone]

## 2021-08-10 NOTE — ED Notes (Signed)
Pt ambulatory to bathroom, able to void sans complications.   

## 2021-08-10 NOTE — Discharge Instructions (Addendum)
Your lab tests, COVID test, and flu test were all okay today.  Your evaluation does show some signs of dehydration, and you were given IV fluids.  Please increase your food and fluid intake at home.  You can follow a soft diet if dental issues make it hard to chew.  Follow-up with your doctor this week.

## 2021-08-22 ENCOUNTER — Emergency Department: Payer: Medicare Other

## 2021-08-22 ENCOUNTER — Other Ambulatory Visit: Payer: Self-pay

## 2021-08-22 ENCOUNTER — Emergency Department
Admission: EM | Admit: 2021-08-22 | Discharge: 2021-08-22 | Disposition: A | Discharge status: Skilled Nursing Facility | Payer: Medicare Other | Attending: Emergency Medicine | Admitting: Emergency Medicine

## 2021-08-22 DIAGNOSIS — Z20822 Contact with and (suspected) exposure to covid-19: Secondary | ICD-10-CM | POA: Diagnosis not present

## 2021-08-22 DIAGNOSIS — I10 Essential (primary) hypertension: Secondary | ICD-10-CM | POA: Insufficient documentation

## 2021-08-22 DIAGNOSIS — Z7982 Long term (current) use of aspirin: Secondary | ICD-10-CM | POA: Diagnosis not present

## 2021-08-22 DIAGNOSIS — Z79899 Other long term (current) drug therapy: Secondary | ICD-10-CM | POA: Diagnosis not present

## 2021-08-22 DIAGNOSIS — R531 Weakness: Secondary | ICD-10-CM | POA: Diagnosis present

## 2021-08-22 DIAGNOSIS — N390 Urinary tract infection, site not specified: Secondary | ICD-10-CM

## 2021-08-22 LAB — BASIC METABOLIC PANEL
Anion gap: 8 (ref 5–15)
BUN: 16 mg/dL (ref 8–23)
CO2: 25 mmol/L (ref 22–32)
Calcium: 9.6 mg/dL (ref 8.9–10.3)
Chloride: 107 mmol/L (ref 98–111)
Creatinine, Ser: 0.79 mg/dL (ref 0.61–1.24)
GFR, Estimated: >60 mL/min (ref >60)
Glucose, Bld: 96 mg/dL (ref 70–99)
Potassium: 4.2 mmol/L (ref 3.5–5.1)
Sodium: 140 mmol/L (ref 135–145)

## 2021-08-22 LAB — URINALYSIS, ROUTINE W REFLEX MICROSCOPIC
Bacteria, UA: NONE SEEN
Bilirubin Urine: NEGATIVE
Glucose, UA: NEGATIVE mg/dL
Hgb urine dipstick: NEGATIVE
Ketones, ur: 5 mg/dL — AB
Nitrite: NEGATIVE
Protein, ur: NEGATIVE mg/dL
Specific Gravity, Urine: 1.014 (ref 1.005–1.030)
Squamous Epithelial / HPF: NONE SEEN (ref 0–5)
pH: 5 (ref 5.0–8.0)

## 2021-08-22 LAB — CBC
HCT: 40.1 % (ref 39.0–52.0)
Hemoglobin: 13.8 g/dL (ref 13.0–17.0)
MCH: 28.7 pg (ref 26.0–34.0)
MCHC: 34.4 g/dL (ref 30.0–36.0)
MCV: 83.4 fL (ref 80.0–100.0)
Platelets: 327 10*3/uL (ref 150–400)
RBC: 4.81 MIL/uL (ref 4.22–5.81)
RDW: 13 % (ref 11.5–15.5)
WBC: 5.6 10*3/uL (ref 4.0–10.5)
nRBC: 0 % (ref 0.0–0.2)

## 2021-08-22 LAB — RESP PANEL BY RT-PCR (FLU A&B, COVID) ARPGX2
Influenza A by PCR: NEGATIVE
Influenza B by PCR: NEGATIVE
SARS Coronavirus 2 by RT PCR: NEGATIVE

## 2021-08-22 MED ORDER — CEPHALEXIN 500 MG PO CAPS: 500.0000 mg | ORAL_CAPSULE | Freq: Two times a day (BID) | ORAL | 0 refills | Status: AC

## 2021-08-22 MED ORDER — CEPHALEXIN 500 MG PO CAPS
500.0000 mg | ORAL_CAPSULE | Freq: Once | ORAL | Status: AC
  Administered 2021-08-22: 500 mg via ORAL
  Filled 2021-08-22: qty 1

## 2021-08-22 NOTE — ED Provider Notes (Signed)
[facility] Emergency Department Provider Note   ____________________________________________   Event Date/Time   First MD Initiated Contact with Patient 08/22/21 1233     (approximate)  I have reviewed the triage vital signs and the nursing notes.   HISTORY  Chief Complaint Weakness    HPI Johnny Rice is a 75 y.o. male who presents for generalized weakness via EMS  LOCATION: Generalized DURATION: 3 days prior to arrival TIMING: Intermittent and stable since onset SEVERITY: Mild QUALITY: Generalized weakness CONTEXT: Patient states that he has been having intermittent generalized weakness over the last 3 days MODIFYING FACTORS: Denies any exacerbating or relieving factors ASSOCIATED SYMPTOMS: Urinary retention   Per medical record review patient has history of hypertension and stroke          Past Medical History:  Diagnosis Date   Hypertension    Stroke [facility])     Patient Active Problem List   Diagnosis Date Noted   Cystitis 04/03/2021   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   Acute cystitis without hematuria 04/02/2021   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   Demand ischemia of myocardium (HCC) 01/16/2020   Severe visual impairment 10/18/2019   Alcohol use disorder, severe, 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HCC) 08/04/2019   History of CVA (cerebrovascular accident) 06/11/2019   Severe sedative, hypnotic, 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   Frequent falls 12/19/2016   Anxiety disorder, unspecified 03/12/2016   Insomnia 02/11/2016   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   Cerebrovascular accident (CVA) (HCC) 05/04/2014   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 Authorizing Provider  acetaminophen (TYLENOL) 500 MG tablet Take 500 mg by mouth every 6 (six) hours as needed.   Yes [provider]  Alum & Mag Hydroxide-Simeth (ANTACID LIQUID PO) Take by mouth.   Yes [provider]  amLODipine (NORVASC) 10 MG tablet Take 1 tablet by mouth daily. 03/12/21  Yes [provider]  ARIPiprazole (ABILIFY) 5 MG tablet Take 1 tablet (5 mg total) by mouth daily. 04/02/21  Yes Clapacs, Jackquline Denmark, MD  ARTIFICIAL TEARS 1.4 % ophthalmic solution Place 2 drops into both eyes daily. 03/18/21  Yes [provider]  ASPIRIN LOW DOSE 81 MG chewable tablet Chew 81 mg by mouth daily. 03/12/21  Yes [provider]  atorvastatin (LIPITOR) 20 MG tablet Take 20 mg by mouth daily. 02/23/20  Yes [provider]  clonazePAM (KLONOPIN) 0.5 MG tablet Take 1 mg by mouth in the morning, at noon, and at bedtime.   Yes [provider]  diphenhydrAMINE (BENADRYL) 25 mg capsule Take 25 mg by mouth every 6 (six) hours as needed.   Yes [provider]  docusate sodium (COLACE) 100 MG capsule Take 1 capsule (100 mg total) by mouth daily. 05/22/21  Yes Sondra Come, MD  DULoxetine (CYMBALTA) 60 MG capsule Take 90 mg by mouth daily.   Yes [provider]  ergocalciferol (VITAMIN D2) 1.25 MG (50000 UT) capsule Take 50,000  Units by mouth once a week.   Yes [provider]  fluticasone (FLONASE) 50 MCG/ACT nasal spray Place into both nostrils daily.   Yes [provider]  gabapentin (NEURONTIN) 300 MG capsule Take 900 mg by mouth at bedtime.   Yes [provider]  hydrocortisone cream 0.5 % Apply 1  application topically 2 (two) times daily.   Yes [provider]  hydrOXYzine (VISTARIL) 50 MG capsule Take 50 mg by mouth 3 (three) times daily as needed.   Yes [provider]  ibuprofen (ADVIL) 800 MG tablet Take 800 mg by mouth every 8 (eight) hours as needed.   Yes [provider]  liothyronine (CYTOMEL) 25 MCG tablet Take 25 mcg by mouth daily.   Yes [provider]  lisinopril (ZESTRIL) 40 MG tablet Take 40 mg by mouth daily.   Yes [provider]  loperamide (IMODIUM) 2 MG capsule Take by mouth as needed for diarrhea or loose stools.   Yes [provider]  magnesium hydroxide (MILK OF MAGNESIA) 400 MG/5ML suspension Take by mouth daily as needed for mild constipation.   Yes [provider]  meloxicam (MOBIC) 15 MG tablet Take 15 mg by mouth daily.   Yes [provider]  metoprolol tartrate (LOPRESSOR) 25 MG tablet Take 25 mg by mouth 2 (two) times daily. 07/08/21  Yes [provider]  nitrofurantoin (MACRODANTIN) 50 MG capsule Take 1 capsule (50 mg total) by mouth daily. Begin after completed after doxycycline 05/22/21  Yes Sondra Come, MD  ondansetron (ZOFRAN ODT) 4 MG disintegrating tablet Take 1 tablet (4 mg total) by mouth every 8 (eight) hours as needed for nausea or vomiting. 08/10/21  Yes Sharman Cheek, MD  polyethylene glycol powder [facility]) 17 GM/SCOOP powder Take 1 Container by mouth once.   Yes [provider]  traZODone (DESYREL) 50 MG tablet Take 50 mg by mouth at bedtime.   Yes [provider]  clindamycin (CLEOCIN) 300 MG capsule Take 300 mg by mouth every 6 (six) hours. Patient not taking: Reported on 07/09/2021    [provider]  guaifenesin (ROBITUSSIN) 100 MG/5ML syrup Take 200 mg by mouth 3 (three) times daily as needed for cough. Patient not taking: Reported on 07/09/2021    [provider]  metoprolol succinate (TOPROL-XL) 25 MG 24 hr  tablet Take 25 mg by mouth daily. Patient not taking: Reported on 08/22/2021    [provider]    Allergies Patient has no known allergies.  Family History  Problem Relation Age of Onset   Heart disease Neg Hx     Social History Social History   Tobacco Use   Smoking status: Never   Smokeless tobacco: Never  Substance Use Topics   Alcohol use: Not Currently   Drug use: Not Currently    Review of Systems onstitutional: No fever/chills Eyes: No visual changes. ENT: No sore throat. Cardiovascular: Denies chest pain. Respiratory: Denies shortness of breath. Gastrointestinal: No abdominal pain.  No nausea, no vomiting.  No diarrhea. Genitourinary: Negative for dysuria. Musculoskeletal: Negative for acute arthralgias Skin: Negative for rash. Neurological: Negative for headaches, endorses generalized weakness and denies numbness/paresthesias in any extremity Psychiatric: Negative for suicidal ideation/homicidal ideation   ____________________________________________   PHYSICAL EXAM:  VITAL SIGNS: ED Triage Vitals  Enc Vitals Group     BP 08/22/21 1117 119/83     Pulse Rate 08/22/21 1117 (!) 58     Resp 08/22/21 1117 18  Temp 08/22/21 1117 98.9 F (37.2 C)     Temp src --      SpO2 08/22/21 1117 98 %     Weight --      Height --      Head Circumference --      Peak Flow --      Pain Score 08/22/21 1050 4     Pain Loc --      Pain Edu? --      Excl. in [place]? --    Constitutional: Alert and oriented. Well appearing and in no acute distress. Eyes: Left eye ptosis conjunctivae are normal. PERRL. Head: Atraumatic. Nose: No congestion/rhinnorhea. Mouth/Throat: Mucous membranes are moist. Neck: No stridor Cardiovascular: Grossly normal heart sounds.  Good peripheral circulation. Respiratory: Normal respiratory effort.  No retractions. Gastrointestinal: Soft and nontender. No distention. Musculoskeletal: No obvious deformities Neurologic: Normal speech  and language. No gross focal neurologic deficits are appreciated. Skin:  Skin is warm and dry. No rash noted. Psychiatric: Mood and affect are normal. Speech and behavior are normal.  ____________________________________________   LABS (all labs ordered are listed, but only abnormal results are displayed)  Labs Reviewed  RESP PANEL BY RT-PCR (FLU A&B, COVID) ARPGX2  BASIC METABOLIC PANEL  CBC  URINALYSIS, ROUTINE W REFLEX MICROSCOPIC  CBG MONITORING, ED   ____________________________________________  EKG  ED ECG REPORT I, Naaman Plummer, the attending physician, personally viewed and interpreted this ECG.  Date: 08/22/2021 EKG Time: 1109 Rate: 57 Rhythm: Bradycardic sinus rhythm QRS Axis: normal Intervals: normal ST/T Wave abnormalities: normal Narrative Interpretation: Bradycardic sinus rhythm.  No evidence of acute ischemia  ____________________________________________  RADIOLOGY  ED MD interpretation: CT of the head without contrast shows no evidence of acute abnormalities including no intracerebral hemorrhage, obvious masses, or significant edema  Official radiology report(s): CT Head Wo Contrast  Result Date: 08/22/2021 CLINICAL DATA:  Weakness, head trauma EXAM: CT HEAD WITHOUT CONTRAST TECHNIQUE: Contiguous axial images were obtained from the base of the skull through the vertex without intravenous contrast. COMPARISON:  07/10/2019 FINDINGS: Brain: No evidence of acute infarction, hemorrhage, hydrocephalus, extra-axial collection or mass lesion/mass effect. Mild periventricular white matter hypodensity. Extensive encephalomalacia of the right PCA territory, unchanged. Vascular: No hyperdense vessel or unexpected calcification. Skull: Normal. Negative for fracture or focal lesion. Sinuses/Orbits: No acute finding. Other: None. IMPRESSION: 1. No acute intracranial pathology. 2. Extensive encephalomalacia of the right PCA territory, in keeping with prior infarction. 3.  Small-vessel white matter disease. Electronically Signed   By: Delanna Ahmadi M.D.   On: 08/22/2021 13:25    ____________________________________________   PROCEDURES  Procedure(s) performed (including Critical Care):  .1-3 Lead EKG Interpretation Performed by: Naaman Plummer, MD Authorized by: Naaman Plummer, MD     Interpretation: normal     ECG rate:  82   ECG rate assessment: normal     Rhythm: sinus rhythm     Ectopy: none     Conduction: normal     ____________________________________________   INITIAL IMPRESSION / ASSESSMENT AND PLAN / ED COURSE  As part of my medical decision making, I reviewed the following data within the electronic medical record, if available:  Nursing notes reviewed and incorporated, Labs reviewed, EKG interpreted, Old chart reviewed, Radiograph reviewed and Notes from prior ED visits reviewed and incorporated        Patient is 75 year old male who presents for generalized weakness that has been intermittent over the last 3 days. DDx: CVA, ACS, TIA, deconditioning,  dehydration, UTI  Laboratory evaluation: Results within normal limits UA pending CT head without any acute findings  Care of this patient will be signed out to the oncoming physician at the end of my shift.  All pertinent patient information conveyed and all questions answered.  All further care and disposition decisions will be made by the oncoming physician.      ____________________________________________   FINAL CLINICAL IMPRESSION(S) / ED DIAGNOSES  Final diagnoses:  Weakness     ED Discharge Orders     None        Note:  This document was prepared using Dragon voice recognition software and may include unintentional dictation errors.    Naaman Plummer, MD 08/22/21 [phone]

## 2021-08-22 NOTE — ED Notes (Signed)
Male purewick placed at this time.  

## 2021-08-22 NOTE — ED Provider Notes (Signed)
4:27 PM Assumed care for off going team.   Blood pressure (!) 142/98, pulse 83, temperature 98.9 F (37.2 C), resp. rate 17, SpO2 100 %.  See their HPI for full report but in brief plan from off going team was pending UA.  We will plan to send back to facility plus or minus antibiotics for UTI.    UA with 6-10 wbcs and LE+ pt does report some dysuria.  Patient reports some increased urination.  We did a bladder scan and there was about 500 cc of urine in there.  Patient has had multiple voiding sessions and have been large volume though so do not feel like he is retaining enough that he would need a Foley.  His kidney function is normal and there is no evidence of severe retention so at this time we will trial him on an antibiotic for possible UTI and have him follow-up with urology to see if he has evidence of enlarged prostate to see if he will eventually need a Foley.  Also have the facility do bladder scans after he has had a large volume urine there.  I discussed the provisional nature of ED diagnosis, the treatment so far, the ongoing plan of care, follow up appointments and return precautions with the patient and any family or support people present. They expressed understanding and agreed with the plan, discharged home.              Concha Se, MD 08/22/21 [phone]

## 2021-08-22 NOTE — ED Notes (Signed)
Spoke with patient's daughter, Hollie Salk regarding patient being dc'd from facility. Daughter unable to take patient back. [facility] called for transport.

## 2021-08-22 NOTE — ED Notes (Signed)
Patient changed into personal clothing.  New adult brief placed.  All personal belongings at patient's bedside transported back to facility with patient.

## 2021-08-22 NOTE — ED Notes (Signed)
Patient's brief wet. Patient given new brief and new male purewick placed at this time.

## 2021-08-22 NOTE — ED Notes (Signed)
Pt reports falling out of bed this morning. Pt states he "passed out the day before yesterday" and reports episodes of passing out for a few days.

## 2021-08-22 NOTE — ED Notes (Signed)
Patient transported to CT 

## 2021-08-22 NOTE — ED Triage Notes (Signed)
Pt comes via [facility] [address]. Pt states weakness for 1 week. VSS per EMs.

## 2021-08-22 NOTE — ED Notes (Signed)
Called EMS waiting on arrival for transport

## 2021-08-22 NOTE — Discharge Instructions (Signed)
I suspect the patient might have a urinary tract infection.  We gave him his first dose of antibiotics here and he can start taking the antibiotics tomorrow.  I also given a urology number for follow-up.  He started to have some mild retention.  He has had a good amount of urine output here and given the risk for infection and discomfort with a Foley we have held off at this time.  However at the facility after he has a good urine output you will can do another bladder scan just to make sure is not getting over 500.  I given the urology number that you will can call to get a follow-up appointment so they can monitor him and see if he needs to have a Foley placed if he continues to have some mild retention after treatment of UTI

## 2021-08-22 NOTE — ED Notes (Signed)
Patient sleeping at this time. NAD noted. °

## 2021-08-23 LAB — URINE CULTURE

## 2021-08-27 ENCOUNTER — Telehealth (INDEPENDENT_AMBULATORY_CARE_PROVIDER_SITE_OTHER): Payer: Medicare Other | Admitting: Urology

## 2021-08-27 ENCOUNTER — Other Ambulatory Visit: Payer: Self-pay

## 2021-08-27 DIAGNOSIS — N390 Urinary tract infection, site not specified: Secondary | ICD-10-CM

## 2021-08-27 DIAGNOSIS — N138 Other obstructive and reflux uropathy: Secondary | ICD-10-CM | POA: Diagnosis not present

## 2021-08-27 DIAGNOSIS — N401 Enlarged prostate with lower urinary tract symptoms: Secondary | ICD-10-CM | POA: Diagnosis not present

## 2021-08-27 MED ORDER — SILODOSIN 8 MG PO CAPS: 8.0000 mg | ORAL_CAPSULE | Freq: Every day | ORAL | 11 refills | Status: DC

## 2021-08-27 NOTE — Progress Notes (Signed)
Virtual Visit via Telephone Note  I connected with Johnny Rice on 08/27/21 at 11:30 AM EST by telephone and verified that I am speaking with the correct person using two identifiers.   Patient location: Facility Provider location: [facility] Urologic Office   I discussed the limitations, risks, security and privacy concerns of performing an evaluation and management service by telephone and the availability of in person appointments. We discussed the impact of the COVID-19 pandemic on the healthcare system, and the importance of social distancing and reducing patient and provider exposure. I also discussed with the patient that there may be a patient responsible charge related to this service. The patient expressed understanding and agreed to proceed.  Reason for visit: Urinary symptoms  History of Present Illness: Very comorbid 75 year old male who lives in an assisted living facility who has had problems with UTIs in the past.  He has mixed weak stream and overactive symptoms of urgency, frequency, and incontinence.  PVRs in my clinic have been normal in the past, and cystoscopy was essentially normal with a small nonobstructive appearing prostate, and normal cytology.  He had only minimal improvement on Flomax previously, and we discontinued this medication as it may have been causing some lightheadedness and weakness.  He reports weakening urinary stream since stopping the Flomax, and was recently seen in the ER on 11/3, and PVRs were reportedly mildly elevated at 300 mL.  Urinalysis there was relatively benign with small leukocytes and 6-10 WBCs but 0-5 RBCs and no bacteria, urine culture grew mixed species.  He was discharged on some antibiotics.  He reports some continued weak stream, and I recommended re- trialing an alpha-blocker.  Silodosin was sent to pharmacy and risks and benefits discussed.  He has been challenging to care for his it is difficult for him to make visits in person, and  we have tried to manage most of his conditions via virtual visits, but I think it is important that we get him in again for a repeat PVR when possible.   Follow Up: Trial of silodosin RTC 6 months with PVR   I discussed the assessment and treatment plan with the patient. The patient was provided an opportunity to ask questions and all were answered. The patient agreed with the plan and demonstrated an understanding of the instructions.   The patient was advised to call back or seek an in-person evaluation if the symptoms worsen or if the condition fails to improve as anticipated.  I provided 13 minutes of non-face-to-face time during this encounter.   Sondra Come, MD

## 2021-08-30 ENCOUNTER — Telehealth: Payer: Self-pay

## 2021-08-30 NOTE — Telephone Encounter (Signed)
Incoming denial of prior authorization for Silodosin from [employer]. Patient must try and fail finasteride, dutasteride, alfuzosin, or terazosin. If none of these medications are acceptable an appeal must be filed. Please advise on alternate.

## 2021-09-04 NOTE — Telephone Encounter (Signed)
Spoke with pharmacist. Jovita Gamma verbal for alfuzosin. Pharmacy will fill and call pt

## 2021-09-24 ENCOUNTER — Other Ambulatory Visit: Payer: Self-pay

## 2021-09-24 ENCOUNTER — Emergency Department
Admission: EM | Admit: 2021-09-24 | Discharge: 2021-09-24 | Disposition: A | Discharge status: Skilled Nursing Facility | Payer: Medicare Other | Attending: Emergency Medicine | Admitting: Emergency Medicine

## 2021-09-24 DIAGNOSIS — Z20822 Contact with and (suspected) exposure to covid-19: Secondary | ICD-10-CM | POA: Diagnosis not present

## 2021-09-24 DIAGNOSIS — F418 Other specified anxiety disorders: Secondary | ICD-10-CM | POA: Diagnosis not present

## 2021-09-24 DIAGNOSIS — Z7982 Long term (current) use of aspirin: Secondary | ICD-10-CM | POA: Diagnosis not present

## 2021-09-24 DIAGNOSIS — I10 Essential (primary) hypertension: Secondary | ICD-10-CM | POA: Diagnosis not present

## 2021-09-24 DIAGNOSIS — Z79899 Other long term (current) drug therapy: Secondary | ICD-10-CM | POA: Diagnosis not present

## 2021-09-24 DIAGNOSIS — F32A Depression, unspecified: Secondary | ICD-10-CM | POA: Diagnosis not present

## 2021-09-24 LAB — RESP PANEL BY RT-PCR (FLU A&B, COVID) ARPGX2
Influenza A by PCR: NEGATIVE
Influenza B by PCR: NEGATIVE
SARS Coronavirus 2 by RT PCR: NEGATIVE

## 2021-09-24 IMAGING — CT CT HEAD W/O CM
3 series · 15 of 47 positions shown, 18 images · non-contrast
Comparison: Prior head CT examinations 01/16/2021 and earlier.
Brain MRI 04/12/2020.

CLINICAL DATA: Mental status change, unknown cause.

EXAM:
CT HEAD WITHOUT CONTRAST
TECHNIQUE: Contiguous axial images were obtained from the base of the skull
through the vertex without intravenous contrast.

[Series 3: head wo · axial · 0.43mm/px · z∈[-152,-22]mm · 9 of 32 slices shown, 12 images]
[im 3/32  brain]
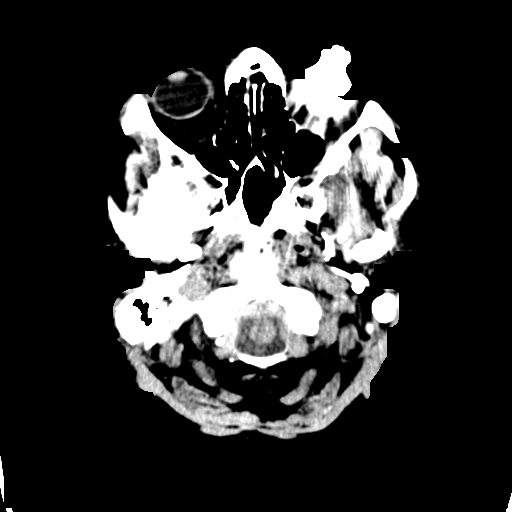
[im 3/32  bone]
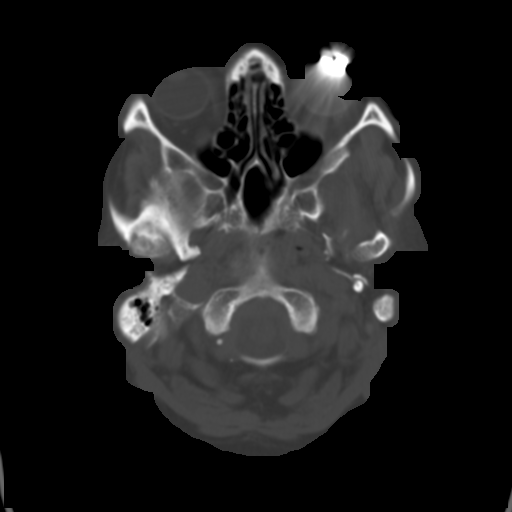
[im 6/32  brain]
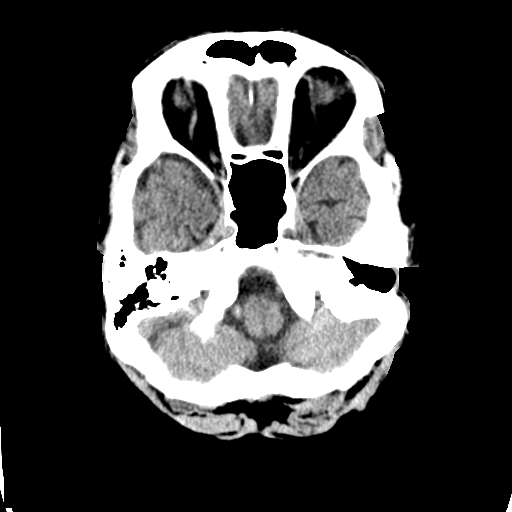
[im 9/32  brain]
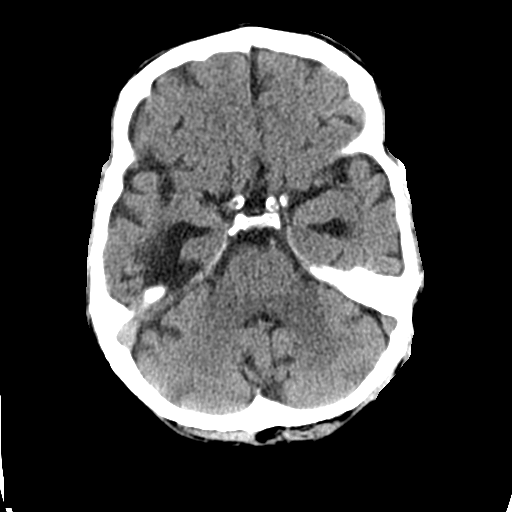
[im 12/32  brain]
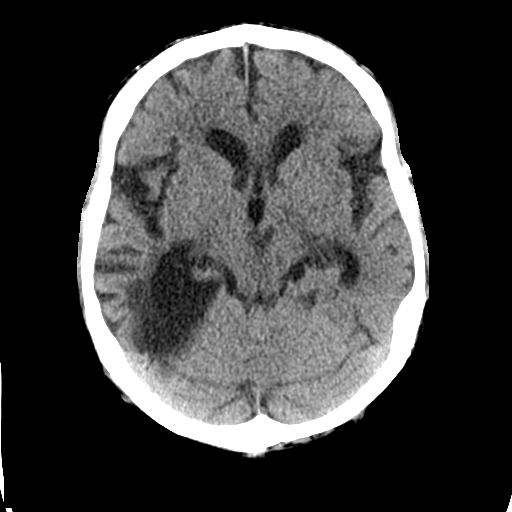
[im 17/32  brain]
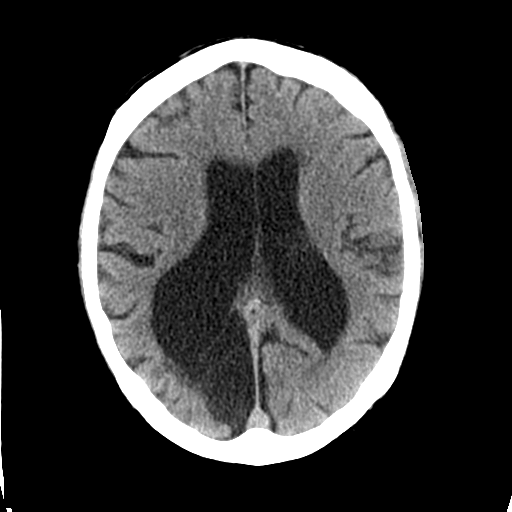
[im 17/32  bone]
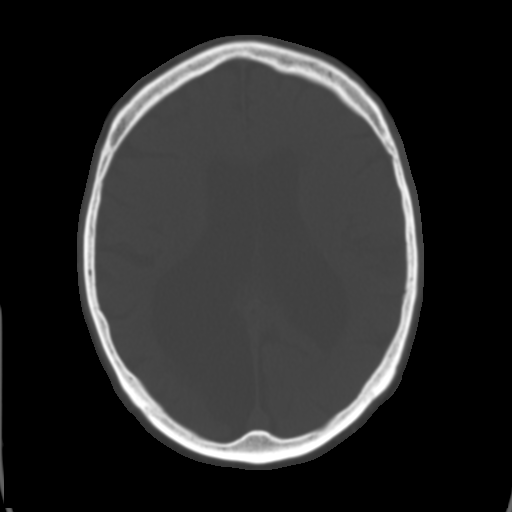
[im 20/32  brain]
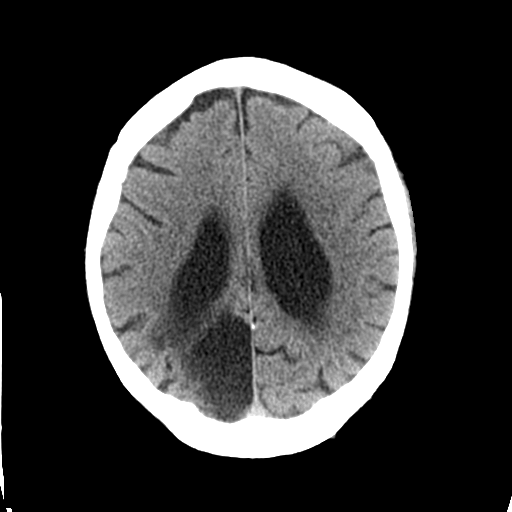
[im 23/32  brain]
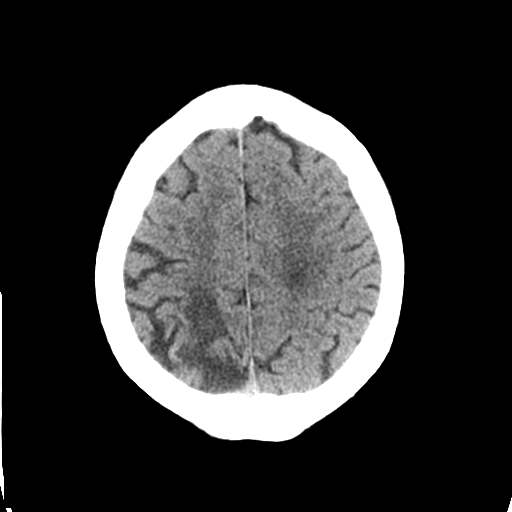
[im 26/32  brain]
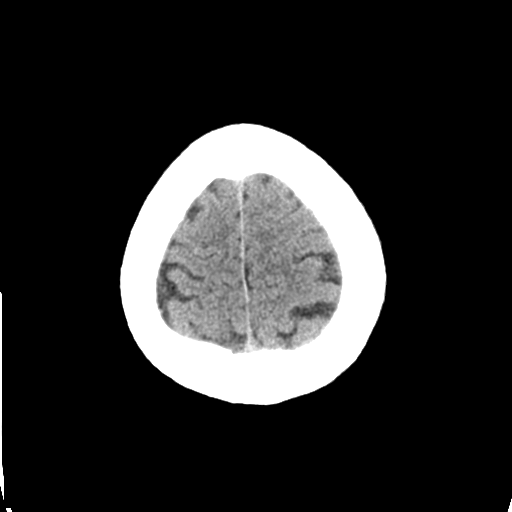
[im 29/32  brain]
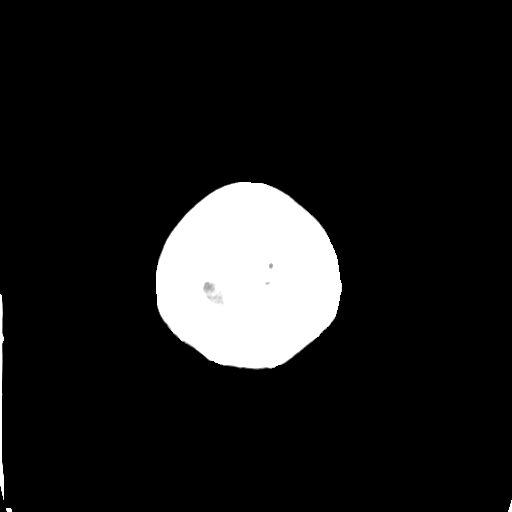
[im 29/32  bone]
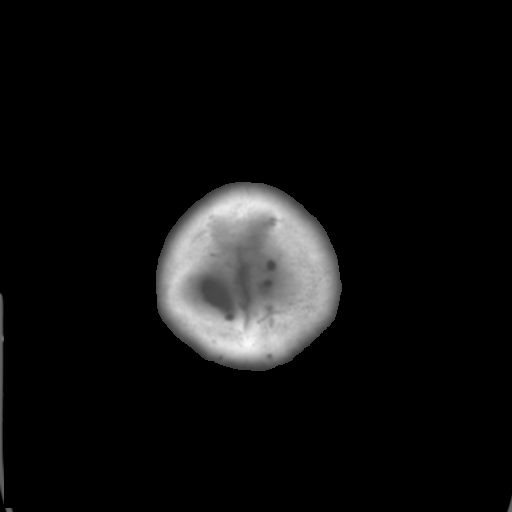

[Series 4: coronal soft tissue · coronal · 0.32mm/px · 3 of 66 slices shown]
[im 22/66  brain]
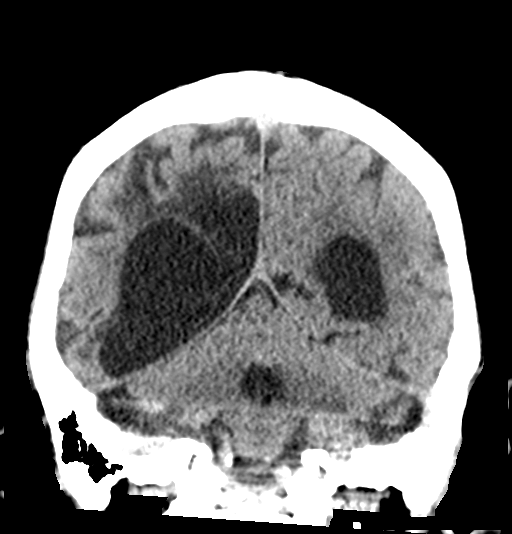
[im 29/66  brain]
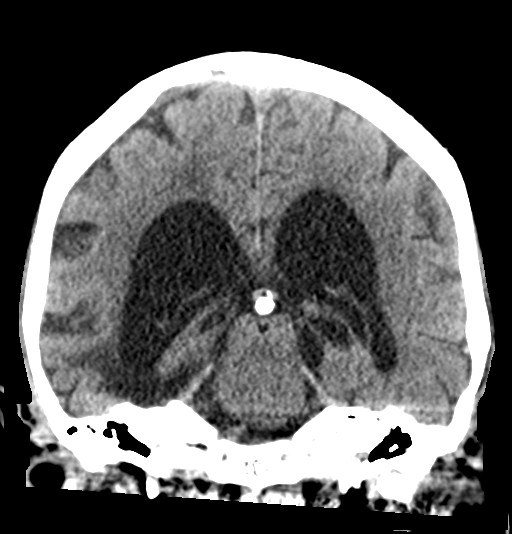
[im 37/66  brain]
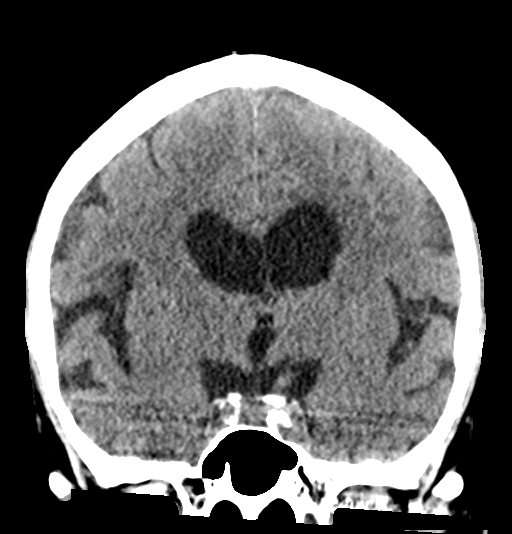

[Series 5: sagittal soft tissue · sagittal · 0.33mm/px · 3 of 55 slices shown]
[im 19/55  brain]
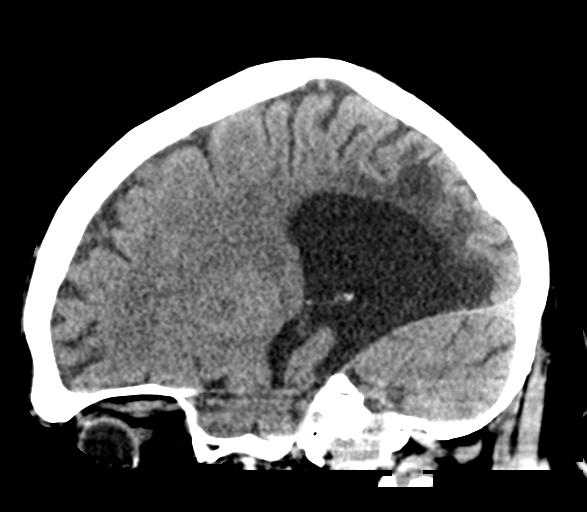
[im 28/55  brain]
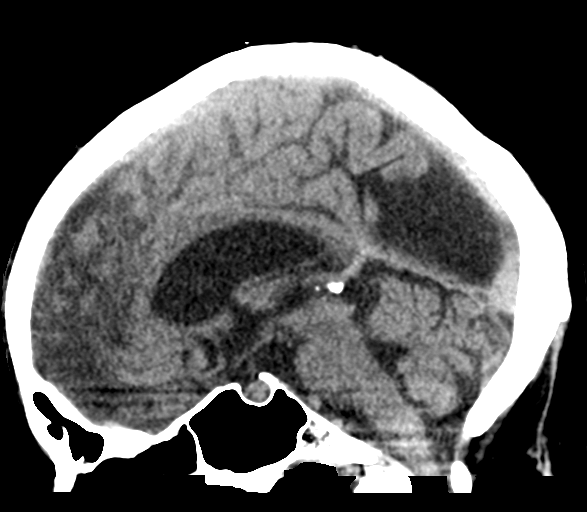
[im 37/55  brain]
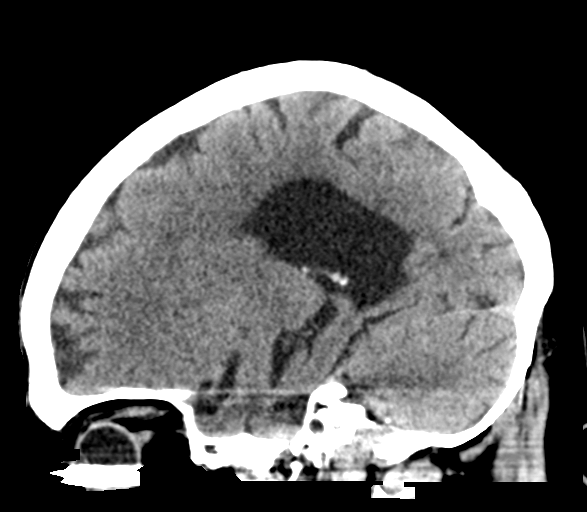

[15 of 47 positions shown; findings below may reference images not displayed]

FINDINGS: Brain:

Redemonstrated chronic cortical/subcortical infarct within the right
parietal, occipital and temporal lobes (right PCA and to a lesser
degree right MCA, territories). Associated ex vacuo dilatation of
the right lateral ventricle.

Background mild generalized cerebral atrophy.

Mild patchy and ill-defined hypoattenuation within the cerebral
white matter, nonspecific but compatible with chronic small vessel
ischemic disease.

There is no acute intracranial hemorrhage.

No acute demarcated cortical infarct.

No extra-axial fluid collection.

No evidence of intracranial mass.

No midline shift.

Vascular: No hyperdense vessel.  Atherosclerotic calcifications

Skull: Normal. Negative for fracture or focal lesion.

Sinuses/Orbits: Streak artifact arising from a metallic focus in the
left periorbital region limits evaluation of the left orbit. No
acute finding within the right orbit. Trace mucosal thickening
within the bilateral ethmoid sinuses.

Other: Redemonstrated sequela of prior left mastoidectomy.
IMPRESSION: No evidence of acute intracranial abnormality.

Redemonstrated chronic cortical/subcortical infarct within the right
parietal, occipital and temporal lobes.

Stable background mild generalized cerebral atrophy and chronic
small vessael ischemic disease.

## 2021-09-24 NOTE — BH Assessment (Signed)
Comprehensive Clinical Assessment (CCA) Screening, Triage and Referral Note  09/24/2021 Johnny Rice [id number]  Johnny Rice, 75 year old male who presents to [facility] ED voluntarily for treatment. Per triage note, Pt arrived via EMS from [facility]. Pt stated to Staff that "I have acute depression with suicidal thoughts."  Staff asked patient to clarify thoughts and feelings. Mr Johnny Rice shared that his sister recently had a stroke and he was unable to get in touch with his daughter.  Pt stated that he has had thought of SI in the past however have never acted upon thoughts and has no intention of acting upon them now.  Pt made a Publishing rights manager with staff.  Pt has clothing on his person and a Cell phone in room with him.  Pt waiting to see on call psych   During TTS assessment pt presents alert and oriented x 4, restless but cooperative, and mood-congruent with affect. The pt does not appear to be responding to internal or external stimuli. Neither is the pt presenting with any delusional thinking. Pt verified the information provided to triage RN.   Pt identifies his main complaint to be that he does not like his current living arrangements. Patient reports he is used to living independently and now he does not have that option. Patient reports that is what makes him depressed. Patient states he has been living at the facility for about a year and he is not pleased with their amenities and rules. Patient reports he had a stroke several years ago and was unable to manage his medications which is why he was moved to this location. Patient also mentioned that his sister recently suffered from a stroke which further added to his depression. Pt reports INPT hx at [facility] and sees a therapist regularly. Pt denies SI/HI/AH/VH. Patient states he will search for other facilities that he is more comfortable with.    Per Sallye Ober, NP, pt does not meet criteria for inpatient psychiatric admission.    Chief Complaint:  Chief Complaint  Patient presents with   Suicidal    Pt stated "I have acute depression with suicidal thoughts."  Pt stated he does Not have intent to act on thoughts   Depression   Visit Diagnosis: Prior hx: anxiety, depression  Patient Reported Information How did you hear about [place]? -- (EMS)  What Is the Reason for Your Visit/Call Today? Patient reports he is depressed about his living arrangements.  How Long Has This Been Causing You Problems? <Week  What Do You Feel Would Help You the Most Today? Housing Assistance; Medication(s)   Have You Recently Had Any Thoughts About Hurting Yourself? No  Are You Planning to Commit Suicide/Harm Yourself At This time? No   Have you Recently Had Thoughts About Hurting Someone Johnny Rice? No  Are You Planning to Harm Someone at This Time? No  Explanation: No data recorded  Have You Used Any Alcohol or Drugs in the Past 24 Hours? No  How Long Ago Did You Use Drugs or Alcohol? No data recorded What Did You Use and How Much? No data recorded  Do You Currently Have a Therapist/Psychiatrist? Yes  Name of Therapist/Psychiatrist: No data recorded  Have You Been Recently Discharged From Any Office Practice or Programs? No  Explanation of Discharge From Practice/Program: No data recorded   CCA Screening Triage Referral Assessment Type of Contact: Face-to-Face  Telemedicine Service Delivery:   Is this Initial or Reassessment? No data recorded Date  Telepsych consult ordered in [facility]:  No data recorded Time Telepsych consult ordered in [facility]:  No data recorded Location of Assessment: [facility] ED  Provider Location: [facility] ED   Collateral Involvement: None provided   Does Patient Have a Court Appointed Legal Guardian? No data recorded Name and Contact of Legal Guardian: No data recorded If Minor and Not Living with Parent(s), Who has Custody? n/a  Is CPS involved or ever been involved? Never  Is APS involved or ever been  involved? Never   Patient Determined To Be At Risk for Harm To Self or Others Based on Review of Patient Reported Information or Presenting Complaint? No  Method: No data recorded Availability of Means: No data recorded Intent: No data recorded Notification Required: No data recorded Additional Information for Danger to Others Potential: No data recorded Additional Comments for Danger to Others Potential: No data recorded Are There Guns or Other Weapons in Your Home? No data recorded Types of Guns/Weapons: No data recorded Are These Weapons Safely Secured?                            No data recorded Who Could Verify You Are Able To Have These Secured: No data recorded Do You Have any Outstanding Charges, Pending Court Dates, Parole/Probation? No data recorded Contacted To Inform of Risk of Harm To Self or Others: No data recorded  Does Patient Present under Involuntary Commitment? No  IVC Papers Initial File Date: No data recorded  [place] of Residence: [place]   Patient Currently Receiving the Following Services: Skilled Nursing Facility; Medication Management; Individual Therapy   Determination of Need: Urgent (48 hours)   Options For Referral: ED Visit; Medication Management; Outpatient Therapy   Discharge Disposition:     Clerance Lav, Counselor, LCAS-A

## 2021-09-24 NOTE — ED Notes (Signed)
EDMD at bedside

## 2021-09-24 NOTE — Discharge Instructions (Signed)
You have been seen in the emergency department for a  psychiatric concern. You have been evaluated both medically as well as psychiatrically. Please follow-up with your outpatient resources provided. Return to the emergency department for any worsening symptoms, or any thoughts of hurting yourself or anyone else so that we may attempt to help you. 

## 2021-09-24 NOTE — ED Provider Notes (Signed)
[facility]  ____________________________________________   Event Date/Time   First MD Initiated Contact with Patient 09/24/21 1312     (approximate)  I have reviewed the triage vital signs and the nursing notes.   HISTORY  Chief Complaint Suicidal (Pt stated "I have acute depression with suicidal thoughts."  Pt stated he does Not have intent to act on thoughts) and Depression    HPI Johnny Rice is a 75 y.o. male with past medical history of depression PTSD who presents with depression.  Patient says over the last 2 weeks has been more depressed.  He is having flashbacks.  Tells me that he was on a plane during 911 and is having flashbacks to this.  He denies suicidal ideation.  He is on medication for depression has been compliant with this.  Denies any other medical complaints today.         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   Frequent falls 12/19/2016   Anxiety disorder, unspecified 03/12/2016   Insomnia 02/11/2016   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   Cerebrovascular accident (CVA) ([place]) 05/04/2014   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 Authorizing Provider  acetaminophen (TYLENOL) 325 MG tablet Take 650 mg by mouth every 4 (four) hours as needed for fever.   Yes [provider]  acetaminophen (TYLENOL) 500 MG tablet Take 500 mg by mouth every 6 (six) hours as needed for mild pain.   Yes [provider]  alfuzosin (UROXATRAL) 10 MG 24 hr tablet Take 10 mg by mouth in the morning.   Yes [provider]  alum & mag hydroxide-simeth (MAALOX/MYLANTA) 200-200-20 MG/5ML suspension Take 30 mLs by mouth 4 (four) times daily as needed (heartburn or indigestion).   Yes [provider]  amLODipine (NORVASC) 10 MG tablet Take 1 tablet by mouth daily. 03/12/21  Yes [provider]  ARIPiprazole (ABILIFY) 5 MG tablet Take 1 tablet (5 mg total) by mouth daily. Patient taking differently: Take 10 mg by mouth daily. 04/02/21  Yes Clapacs, Madie Reno, MD  aspirin 81 MG chewable tablet Chew 81 mg by mouth daily.   Yes [provider]  atorvastatin (LIPITOR) 20 MG tablet Take 20 mg by mouth at bedtime.   Yes [provider]  barrier cream (NON-SPECIFIED) CREA Apply 1 application topically as needed (post-toileting skin protection).   Yes [provider]  clonazePAM (KLONOPIN) 0.5 MG tablet Take 1 mg by mouth in  the morning, at noon, and at bedtime.   Yes [provider]  diphenhydrAMINE (BENADRYL) 25 MG tablet Take 25 mg by mouth every 6 (six) hours as needed for allergies or itching.   Yes [provider]  docusate sodium (COLACE) 100 MG capsule Take 1 capsule (100 mg total) by mouth daily. 05/22/21  Yes Sondra Come, MD  DULoxetine (CYMBALTA) 30 MG capsule Take 90  mg by mouth daily.   Yes [provider]  ergocalciferol (VITAMIN D2) 1.25 MG (50000 UT) capsule Take 50,000 Units by mouth once a week.   Yes [provider]  fluticasone (FLONASE) 50 MCG/ACT nasal spray Place 2 sprays into both nostrils daily.   Yes [provider]  gabapentin (NEURONTIN) 300 MG capsule Take 900 mg by mouth at bedtime.   Yes [provider]  guaifenesin (ROBITUSSIN) 100 MG/5ML syrup Take 300 mg by mouth every 6 (six) hours as needed for cough.   Yes [provider]  hydrocortisone cream 0.5 % Apply 1 application topically 2 (two) times daily. (Apply to left temple and under left eye)   Yes [provider]  hydrOXYzine (VISTARIL) 50 MG capsule Take 50 mg by mouth every 8 (eight) hours as needed (agitation).   Yes [provider]  ibuprofen (ADVIL) 800 MG tablet Take 800 mg by mouth every 6 (six) hours as needed for moderate pain.   Yes [provider]  liothyronine (CYTOMEL) 25 MCG tablet Take 50 mcg by mouth daily.   Yes [provider]  lisinopril (ZESTRIL) 40 MG tablet Take 40 mg by mouth daily.   Yes [provider]  loperamide (IMODIUM) 2 MG capsule Take 4 mg by mouth every 4 (four) hours as needed for diarrhea or loose stools. (Max 8 doses in 24 hours)   Yes [provider]  magnesium hydroxide (MILK OF MAGNESIA) 400 MG/5ML suspension Take 30 mLs by mouth daily as needed for mild constipation.   Yes [provider]  meloxicam (MOBIC) 15 MG tablet Take 15 mg by mouth daily.   Yes [provider]  metoprolol tartrate (LOPRESSOR) 25 MG tablet Take 25 mg by mouth 2 (two) times daily. 07/08/21  Yes [provider]  neomycin-bacitracin-polymyxin (NEOSPORIN) [phone] ointment Apply 1 application topically daily as needed (wound care). (Apply to right big toe)   Yes [provider]  nitrofurantoin (MACRODANTIN) 50 MG capsule Take 1 capsule (50 mg  total) by mouth daily. Begin after completed after doxycycline 05/22/21  Yes Sondra Come, MD  ondansetron (ZOFRAN ODT) 4 MG disintegrating tablet Take 1 tablet (4 mg total) by mouth every 8 (eight) hours as needed for nausea or vomiting. 08/10/21  Yes Sharman Cheek, MD  polyethylene glycol powder (GLYCOLAX/MIRALAX) 17 GM/SCOOP powder Take 17 g by mouth once.   Yes [provider]  polyvinyl alcohol (LIQUIFILM TEARS) 1.4 % ophthalmic solution Place 2 drops into both eyes daily.   Yes [provider]  traZODone (DESYREL) 50 MG tablet Take 50 mg by mouth at bedtime.   Yes [provider]    Allergies Patient has no known allergies.  Family History  Problem Relation Age of Onset   Heart disease Neg Hx     Social History Social History   Tobacco Use   Smoking status: Never   Smokeless tobacco: Never  Vaping Use   Vaping Use: Never used  Substance Use Topics   Alcohol use: Not Currently   Drug use: Not Currently  Review of Systems   Review of Systems  Constitutional:  Negative for chills and fever.  Respiratory:  Negative for shortness of breath.   Cardiovascular:  Negative for chest pain.  Gastrointestinal:  Negative for abdominal pain.  Psychiatric/Behavioral:  Positive for dysphoric mood. Negative for suicidal ideas.   All other systems reviewed and are negative.  Physical Exam Updated Vital Signs BP 107/68 (BP Location: Right Arm)   Pulse 62   Temp 97.6 F (36.4 C) (Oral)   Resp 17   SpO2 98%   Physical Exam Vitals and nursing note reviewed.  Constitutional:      General: He is not in acute distress.    Appearance: Normal appearance.  HENT:     Head:     Comments: Left eye with chronic deformity Eyes:     General: No scleral icterus.    Conjunctiva/sclera: Conjunctivae normal.  Pulmonary:     Effort: Pulmonary effort is normal. No respiratory distress.     Breath sounds: Normal breath sounds. No wheezing.  Musculoskeletal:         General: No deformity or signs of injury.     Cervical back: Normal range of motion.  Skin:    Coloration: Skin is not jaundiced or pale.  Neurological:     General: No focal deficit present.     Mental Status: He is alert and oriented to person, place, and time. Mental status is at baseline.     Comments: Patient is very hard of hearing on the left   Psychiatric:     Comments: Patient has a flat affect, positive depressive thoughts but negative suicidal ideation     LABS (all labs ordered are listed, but only abnormal results are displayed)  Labs Reviewed  RESP PANEL BY RT-PCR (FLU A&B, COVID) [id number]   ____________________________________________  EKG   ____________________________________________  RADIOLOGY Almeta Monas, personally viewed and evaluated these images (plain radiographs) as part of my medical decision making, as well as reviewing the written report by the radiologist.  ED MD interpretation:      ____________________________________________   PROCEDURES  Procedure(s) performed (including Critical Care):  Procedures   ____________________________________________   INITIAL IMPRESSION / ASSESSMENT AND PLAN / ED COURSE     Patient is a 74 year old male with history depression PTSD who presents with depression.  He denies suicidal ideation.  Vital signs within normal limits.  He has no other medical complaints at this time.  He was seen by psychiatry who knows him well and does not feel that he needs any inpatient criteria.  He lives in a group facility.  He is stable for discharge at this time.      ____________________________________________   FINAL CLINICAL IMPRESSION(S) / ED DIAGNOSES  Final diagnoses:  Depression, unspecified depression type     ED Discharge Orders     None        Note:  This document was prepared using Dragon voice recognition software and may include unintentional dictation errors.    Rada Hay, MD 09/24/21 [phone]

## 2021-09-24 NOTE — ED Provider Notes (Signed)
-----------------------------------------   3:34 PM on 09/24/2021 ----------------------------------------- Patient has been seen and evaluated by psychiatry.  They believe the patient safer discharge home from a psychiatric standpoint.  Patient will be discharged with outpatient resources.   Minna Antis, MD 09/24/21 1534

## 2021-09-24 NOTE — ED Notes (Signed)
Called [facility] for transport to the [place] [facility] [place]  [phone]

## 2021-09-24 NOTE — Consult Note (Signed)
[facility] Face-to-Face Psychiatry Consult   Reason for Consult:  Depression Referring Physician:  Sidney Ace Patient Identification: Delfin Squillace MRN:  [id number] Principal Diagnosis: <principal problem not specified> Diagnosis:  Active Problems:   * No active hospital problems. *   Total Time spent with patient: 1 hour  Subjective:  "I'm here because of depression. I'm not so good." Rondey Fallen is a 75 y.o. male patient admitted with depression.  HPI:  Patient seen and chart reviewed. Patient states that he came to the hospital because he is depressed. He reports several past inpatient psychiatric hospitalizations, stating the last was at [facility] prior to going to his current living facility. He denies suicidal ideations, but states that he really hates the "3rd rate place" that he lives. He misses the independent living facility that he had in [place], but he was unable to manage his medications. Patient had a stroke several years ago and his ambulation and some cognitive abilities have declined. Patient reports that his sister in [place] had a stroke recently and that contributes to his depression as well. Patient is currently estranged from his daughter and can't see his granddaughter. Patient perseverates that he would like to live at a different facility. Writer discussed with patient that he does not meet criteria for inpatient psychiatric hospitalization and even if we were to recommend that, he would need to return to the facility he came from. Patient expresses understanding and states that he is going to explore other options for housing. Patient can return to his facility at this time.   Past Psychiatric History: Anxiety, depression  Risk to Self:   Risk to Others:   Prior Inpatient Therapy:   Prior Outpatient Therapy:    Past Medical History:  Past Medical History:  Diagnosis Date   Hypertension    Stroke [facility])     Past Surgical History:  Procedure Laterality Date    APPENDECTOMY     Family History:  Family History  Problem Relation Age of Onset   Heart disease Neg Hx    Family Psychiatric  History: unknown Social History:  Social History   Substance and Sexual Activity  Alcohol Use Not Currently     Social History   Substance and Sexual Activity  Drug Use Not Currently    Social History   Socioeconomic History   Marital status: Divorced    Spouse name: Not on file   Number of children: Not on file   Years of education: Not on file   Highest education level: Not on file  Occupational History   Not on file  Tobacco Use   Smoking status: Never   Smokeless tobacco: Never  Vaping Use   Vaping Use: Never used  Substance and Sexual Activity   Alcohol use: Not Currently   Drug use: Not Currently   Sexual activity: Not on file  Other Topics Concern   Not on file  Social History Narrative   Not on file   Social Determinants of Health   Financial Resource Strain: Not on file  Food Insecurity: Not on file  Transportation Needs: Not on file  Physical Activity: Not on file  Stress: Not on file  Social Connections: Not on file   Additional Social History:    Allergies:  No Known Allergies  Labs:  Results for orders placed or performed during the hospital encounter of 09/24/21 (from the past 48 hour(s))  Resp Panel by RT-PCR (Flu A&B, Covid) Nasopharyngeal Swab  Status: None   Collection Time: 09/24/21  2:11 PM   Specimen: Nasopharyngeal Swab; Nasopharyngeal(NP) swabs in vial transport medium  Result Value Ref Range   SARS Coronavirus 2 by RT PCR NEGATIVE NEGATIVE    Comment: (NOTE) SARS-CoV-2 target nucleic acids are NOT DETECTED.  The SARS-CoV-2 RNA is generally detectable in upper respiratory specimens during the acute phase of infection. The lowest concentration of SARS-CoV-2 viral copies this assay can detect is 138 copies/mL. A negative result does not preclude SARS-Cov-2 infection and should not be used as the  sole basis for treatment or other patient management decisions. A negative result may occur with  improper specimen collection/handling, submission of specimen other than nasopharyngeal swab, presence of viral mutation(s) within the areas targeted by this assay, and inadequate number of viral copies(<138 copies/mL). A negative result must be combined with clinical observations, patient history, and epidemiological information. The expected result is Negative.  Fact Sheet for Patients:  BloggerCourse.com  Fact Sheet for Healthcare Providers:  SeriousBroker.it  This test is no t yet approved or cleared by the [place] FDA and  has been authorized for detection and/or diagnosis of SARS-CoV-2 by FDA under an Emergency Use Authorization (EUA). This EUA will remain  in effect (meaning this test can be used) for the duration of the COVID-19 declaration under Section 564(b)(1) of the Act, 21 U.S.C.section 360bbb-3(b)(1), unless the authorization is terminated  or revoked sooner.       Influenza A by PCR NEGATIVE NEGATIVE   Influenza B by PCR NEGATIVE NEGATIVE    Comment: (NOTE) The Xpert Xpress SARS-CoV-2/FLU/RSV plus assay is intended as an aid in the diagnosis of influenza from Nasopharyngeal swab specimens and should not be used as a sole basis for treatment. Nasal washings and aspirates are unacceptable for Xpert Xpress SARS-CoV-2/FLU/RSV testing.  Fact Sheet for Patients: BloggerCourse.com  Fact Sheet for Healthcare Providers: SeriousBroker.it  This test is not yet approved or cleared by the [place] FDA and has been authorized for detection and/or diagnosis of SARS-CoV-2 by FDA under an Emergency Use Authorization (EUA). This EUA will remain in effect (meaning this test can be used) for the duration of the COVID-19 declaration under Section 564(b)(1) of the Act, 21  U.S.C. section 360bbb-3(b)(1), unless the authorization is terminated or revoked.  Performed at [facility], [address] [place], [place] [address]     No current facility-administered medications for this encounter.   Current Outpatient Medications  Medication Sig Dispense Refill   acetaminophen (TYLENOL) 325 MG tablet Take 650 mg by mouth every 4 (four) hours as needed for fever.     acetaminophen (TYLENOL) 500 MG tablet Take 500 mg by mouth every 6 (six) hours as needed for mild pain.     alfuzosin (UROXATRAL) 10 MG 24 hr tablet Take 10 mg by mouth in the morning.     alum & mag hydroxide-simeth (MAALOX/MYLANTA) 200-200-20 MG/5ML suspension Take 30 mLs by mouth 4 (four) times daily as needed (heartburn or indigestion).     amLODipine (NORVASC) 10 MG tablet Take 1 tablet by mouth daily.     ARIPiprazole (ABILIFY) 5 MG tablet Take 1 tablet (5 mg total) by mouth daily. (Patient taking differently: Take 10 mg by mouth daily.) 30 tablet 1   aspirin 81 MG chewable tablet Chew 81 mg by mouth daily.     atorvastatin (LIPITOR) 20 MG tablet Take 20 mg by mouth at bedtime.     barrier cream (NON-SPECIFIED) CREA Apply 1  application topically as needed (post-toileting skin protection).     clonazePAM (KLONOPIN) 0.5 MG tablet Take 1 mg by mouth in the morning, at noon, and at bedtime.     diphenhydrAMINE (BENADRYL) 25 MG tablet Take 25 mg by mouth every 6 (six) hours as needed for allergies or itching.     docusate sodium (COLACE) 100 MG capsule Take 1 capsule (100 mg total) by mouth daily. 90 capsule 3   DULoxetine (CYMBALTA) 30 MG capsule Take 90 mg by mouth daily.     ergocalciferol (VITAMIN D2) 1.25 MG (50000 UT) capsule Take 50,000 Units by mouth once a week.     fluticasone (FLONASE) 50 MCG/ACT nasal spray Place 2 sprays into both nostrils daily.     gabapentin (NEURONTIN) 300 MG capsule Take 900 mg by mouth at bedtime.     guaifenesin (ROBITUSSIN) 100 MG/5ML syrup Take 300 mg  by mouth every 6 (six) hours as needed for cough.     hydrocortisone cream 0.5 % Apply 1 application topically 2 (two) times daily. (Apply to left temple and under left eye)     hydrOXYzine (VISTARIL) 50 MG capsule Take 50 mg by mouth every 8 (eight) hours as needed (agitation).     ibuprofen (ADVIL) 800 MG tablet Take 800 mg by mouth every 6 (six) hours as needed for moderate pain.     liothyronine (CYTOMEL) 25 MCG tablet Take 50 mcg by mouth daily.     lisinopril (ZESTRIL) 40 MG tablet Take 40 mg by mouth daily.     loperamide (IMODIUM) 2 MG capsule Take 4 mg by mouth every 4 (four) hours as needed for diarrhea or loose stools. (Max 8 doses in 24 hours)     magnesium hydroxide (MILK OF MAGNESIA) 400 MG/5ML suspension Take 30 mLs by mouth daily as needed for mild constipation.     meloxicam (MOBIC) 15 MG tablet Take 15 mg by mouth daily.     metoprolol tartrate (LOPRESSOR) 25 MG tablet Take 25 mg by mouth 2 (two) times daily.     neomycin-bacitracin-polymyxin (NEOSPORIN) [id number] ointment Apply 1 application topically daily as needed (wound care). (Apply to right big toe)     nitrofurantoin (MACRODANTIN) 50 MG capsule Take 1 capsule (50 mg total) by mouth daily. Begin after completed after doxycycline 90 capsule 0   ondansetron (ZOFRAN ODT) 4 MG disintegrating tablet Take 1 tablet (4 mg total) by mouth every 8 (eight) hours as needed for nausea or vomiting. 20 tablet 0   polyethylene glycol powder (GLYCOLAX/MIRALAX) 17 GM/SCOOP powder Take 17 g by mouth once.     polyvinyl alcohol (LIQUIFILM TEARS) 1.4 % ophthalmic solution Place 2 drops into both eyes daily.     traZODone (DESYREL) 50 MG tablet Take 50 mg by mouth at bedtime.      Musculoskeletal: Strength & Muscle Tone: decreased Gait & Station:  did not observe Patient leans: N/A            Psychiatric Specialty Exam:  Presentation  General Appearance: Appropriate for Environment Eye Contact:Good Speech:Clear and Coherent;  Slow Speech Volume:Normal Handedness:No data recorded  Mood and Affect  Mood:Depressed Affect:Blunt; Flat  Thought Process  Thought Processes:Coherent Descriptions of Associations:Intact Orientation:Full (Time, Place and Person) Thought Content:Rumination; WDL History of Schizophrenia/Schizoaffective disorder:No data recorded Duration of Psychotic Symptoms:No data recorded Hallucinations:Hallucinations: None Ideas of Reference:None Suicidal Thoughts:Suicidal Thoughts: No Homicidal Thoughts:Homicidal Thoughts: No  Sensorium  Memory:Immediate Fair Judgment:Fair Insight:Poor  Executive Functions  Concentration:Good Attention Span:Good Recall:Fair Fund of  Knowledge:Fair Language:Fair  Psychomotor Activity  Psychomotor Activity:Psychomotor Activity: -- (gait not observed)  Assets  Assets:No data recorded  Sleep  Sleep:Sleep: Fair  Physical Exam: Physical Exam Vitals and nursing note reviewed.  HENT:     Head: Normocephalic.     Nose: No congestion or rhinorrhea.  Eyes:     General:        Right eye: No discharge.        Left eye: No discharge.  Cardiovascular:     Rate and Rhythm: Normal rate.  Pulmonary:     Effort: Pulmonary effort is normal.  Musculoskeletal:        General: Normal range of motion.     Cervical back: Normal range of motion.  Skin:    General: Skin is dry.  Neurological:     Mental Status: He is alert.  Psychiatric:        Mood and Affect: Mood is depressed. Affect is blunt.        Speech: Speech is delayed (slow, previous stroke).        Cognition and Memory: Cognition is impaired. Memory is impaired.        Judgment: Judgment normal.     Comments: Chronic depression   Review of Systems  Psychiatric/Behavioral:  Positive for depression. The patient is nervous/anxious.   All other systems reviewed and are negative. Blood pressure 107/68, pulse 62, temperature 97.6 F (36.4 C), temperature source Oral, resp. rate 17, SpO2 98 %.  There is no height or weight on file to calculate BMI.  Treatment Plan Summary: Plan 75 year old male presents with depression. Patient states he is not suicidal. He says that his depression is highly connected with his current living facility and declining ability to live independently, He would prefer to be in a different living situation. Patient states that he likes the medication that he is on. Patient does not meet criteria for inpatient psychiatric hospitalization.Reviewed with EDP  Disposition: No evidence of imminent risk to self or others at present.   Patient does not meet criteria for psychiatric inpatient admission. Supportive therapy provided about ongoing stressors. Discussed crisis plan, support from social network, calling 911, coming to the Emergency Department, and calling Suicide Hotline.  Vanetta Mulders, NP 09/24/2021 3:18 PM

## 2021-09-24 NOTE — ED Notes (Addendum)
Pt arrived via EMS from [facility]. Pt stated to Staff that "I have acute depression with suicidal thoughts."  Staff asked patient to clarify thoughts and feelings. Johnny Rice shared that his sister recently had a stroke and he was unable to get in touch with his daughter.  Pt stated that he has had thought of SI in the past however have never acted upon thoughts and has no intention of acting upon them now.  Pt made a Publishing rights manager with staff.  Pt has clothing on his person and a  Cell phone in room with him.  Pt waiting to see oncall psych

## 2021-09-24 NOTE — ED Notes (Signed)
Vol pending consult 

## 2021-10-04 ENCOUNTER — Encounter: Payer: Self-pay | Admitting: Emergency Medicine

## 2021-10-04 ENCOUNTER — Other Ambulatory Visit: Payer: Self-pay

## 2021-10-04 DIAGNOSIS — I10 Essential (primary) hypertension: Secondary | ICD-10-CM | POA: Insufficient documentation

## 2021-10-04 DIAGNOSIS — R1031 Right lower quadrant pain: Secondary | ICD-10-CM | POA: Diagnosis present

## 2021-10-04 DIAGNOSIS — R197 Diarrhea, unspecified: Secondary | ICD-10-CM | POA: Diagnosis not present

## 2021-10-04 DIAGNOSIS — R45851 Suicidal ideations: Secondary | ICD-10-CM | POA: Insufficient documentation

## 2021-10-04 DIAGNOSIS — K4021 Bilateral inguinal hernia, without obstruction or gangrene, recurrent: Secondary | ICD-10-CM | POA: Diagnosis not present

## 2021-10-04 DIAGNOSIS — Z79899 Other long term (current) drug therapy: Secondary | ICD-10-CM | POA: Insufficient documentation

## 2021-10-04 DIAGNOSIS — Z7982 Long term (current) use of aspirin: Secondary | ICD-10-CM | POA: Diagnosis not present

## 2021-10-04 DIAGNOSIS — F32A Depression, unspecified: Secondary | ICD-10-CM | POA: Insufficient documentation

## 2021-10-04 LAB — COMPREHENSIVE METABOLIC PANEL
ALT: 22 U/L (ref 0–44)
AST: 22 U/L (ref 15–41)
Albumin: 4.4 g/dL (ref 3.5–5.0)
Alkaline Phosphatase: 144 U/L — ABNORMAL HIGH (ref 38–126)
Anion gap: 7 (ref 5–15)
BUN: 19 mg/dL (ref 8–23)
CO2: 27 mmol/L (ref 22–32)
Calcium: 9.4 mg/dL (ref 8.9–10.3)
Chloride: 107 mmol/L (ref 98–111)
Creatinine, Ser: 1.28 mg/dL — ABNORMAL HIGH (ref 0.61–1.24)
GFR, Estimated: 59 mL/min — ABNORMAL LOW (ref >60)
Glucose, Bld: 101 mg/dL — ABNORMAL HIGH (ref 70–99)
Potassium: 4 mmol/L (ref 3.5–5.1)
Sodium: 141 mmol/L (ref 135–145)
Total Bilirubin: 0.6 mg/dL (ref 0.3–1.2)
Total Protein: 7.4 g/dL (ref 6.5–8.1)

## 2021-10-04 LAB — CBC
HCT: 38.8 % — ABNORMAL LOW (ref 39.0–52.0)
Hemoglobin: 13.2 g/dL (ref 13.0–17.0)
MCH: 28.3 pg (ref 26.0–34.0)
MCHC: 34 g/dL (ref 30.0–36.0)
MCV: 83.3 fL (ref 80.0–100.0)
Platelets: 292 10*3/uL (ref 150–400)
RBC: 4.66 MIL/uL (ref 4.22–5.81)
RDW: 13.3 % (ref 11.5–15.5)
WBC: 5.2 10*3/uL (ref 4.0–10.5)
nRBC: 0 % (ref 0.0–0.2)

## 2021-10-04 LAB — LIPASE, BLOOD: Lipase: 35 U/L (ref 11–51)

## 2021-10-04 MED ORDER — ACETAMINOPHEN 325 MG PO TABS
650.0000 mg | ORAL_TABLET | Freq: Once | ORAL | Status: AC
  Administered 2021-10-04: 650 mg via ORAL
  Filled 2021-10-04: qty 2

## 2021-10-04 NOTE — ED Triage Notes (Signed)
Says he is here for anxiety.  Says he ate lunch and then it started.

## 2021-10-04 NOTE — ED Notes (Signed)
Pt asked about wait time.  Pt told this RN does not know how much longer wait time will be.

## 2021-10-04 NOTE — ED Triage Notes (Signed)
First RN Note: pt to ED via [facility] from [facility] with c/o SI, per EMS pt recently seen for same thing and discharged back to the [facility]. Per EMS pt also c/o midline abd pain.   60HR 93% 97/61

## 2021-10-05 ENCOUNTER — Emergency Department
Admission: EM | Admit: 2021-10-05 | Discharge: 2021-10-05 | Disposition: A | Discharge status: Home / Self Care | Payer: Medicare Other | Attending: Emergency Medicine | Admitting: Emergency Medicine

## 2021-10-05 ENCOUNTER — Emergency Department: Payer: Medicare Other

## 2021-10-05 DIAGNOSIS — F32A Depression, unspecified: Secondary | ICD-10-CM

## 2021-10-05 DIAGNOSIS — R197 Diarrhea, unspecified: Secondary | ICD-10-CM

## 2021-10-05 DIAGNOSIS — K4020 Bilateral inguinal hernia, without obstruction or gangrene, not specified as recurrent: Secondary | ICD-10-CM

## 2021-10-05 DIAGNOSIS — K4021 Bilateral inguinal hernia, without obstruction or gangrene, recurrent: Secondary | ICD-10-CM | POA: Diagnosis not present

## 2021-10-05 DIAGNOSIS — R109 Unspecified abdominal pain: Secondary | ICD-10-CM

## 2021-10-05 MED ORDER — ALUM & MAG HYDROXIDE-SIMETH 200-200-20 MG/5ML PO SUSP
30.0000 mL | Freq: Once | ORAL | Status: AC
  Administered 2021-10-05: 30 mL via ORAL
  Filled 2021-10-05: qty 30

## 2021-10-05 MED ORDER — CLONAZEPAM 0.5 MG PO TABS
0.5000 mg | ORAL_TABLET | ORAL | Status: AC
  Administered 2021-10-05: 0.5 mg via ORAL
  Filled 2021-10-05: qty 1

## 2021-10-05 NOTE — ED Notes (Signed)
Patient states he is here because he has abdominal pain.  Patient report pain has resolved and he wants to go home.  Patient questioned about making statement to EMS about SI, patient states he didn't say that.

## 2021-10-05 NOTE — ED Notes (Signed)
This rn attempted to call report at this time with no answer. E-signature not working at this time. Pt verbalized understanding of D/C instructions, prescriptions and follow up care with no further questions at this time. Pt in NAD and ambulatory at time of D/C. Pt going back to facility via ems.

## 2021-10-05 NOTE — ED Provider Notes (Signed)
[facility] Emergency Department Provider Note  ____________________________________________   Event Date/Time   First MD Initiated Contact with Patient 10/05/21 [phone]     (approximate)  I have reviewed the triage vital signs and the nursing notes.   HISTORY  Chief Complaint Anxiety and Abdominal Pain  Level 5 caveat: History is somewhat limited by the patient's chronic conditions including personality disorder, anxiety disorder, history of CVA, and history of depression.  HPI Johnny Rice is a 75 y.o. male with extensive medical and psychiatric history as listed below who presents for evaluation of abdominal discomfort including diarrhea and persistent depression.  He intermittently has suicidal ideation and long-term depression.  He is unhappy with his living situation.  He was seen yesterday in the emergency department and evaluated by the ED physician as well as by psychiatry and found to not meet criteria for inpatient treatment.  He excepted this at the time and said he would look into alternative housing arrangements.  Had some point after returning back to his facility, he expressed more concerns about depression and having thoughts of suicide and was sent back to the emergency department.  However, he tells me that he "might of said that in passing" but his main concern is his intermittent abdominal discomfort and diarrhea.  He said that it happens after he eats.  Nothing in particular makes it better or worse.  He is not currently having any pain but he is concerned that it will come back after he eats something.  He said that the food "goes right through me".  He has not having any nausea or vomiting.  He denies fever, sore throat, chest pain.  The pain is a cramping in his lower and right lower part of the abdomen.     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   Frequent falls 12/19/2016   Anxiety disorder, unspecified 03/12/2016   Insomnia 02/11/2016   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   Cerebrovascular accident (CVA) (HCC) 05/04/2014   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 Authorizing Provider  acetaminophen (TYLENOL) 325 MG tablet Take 650 mg by mouth every 4 (four) hours as needed for fever.    [provider]  acetaminophen (TYLENOL) 500 MG tablet Take 500 mg by mouth every 6 (six) hours as needed for mild pain.    [provider]  alfuzosin (UROXATRAL) 10 MG 24 hr tablet Take 10 mg by mouth in the morning.  [provider]  alum & mag hydroxide-simeth (MAALOX/MYLANTA) 200-200-20 MG/5ML suspension Take 30 mLs by mouth 4 (four) times daily as needed (heartburn or indigestion).    [provider]  amLODipine (NORVASC) 10 MG tablet Take 1 tablet by mouth daily. 03/12/21   [provider]  ARIPiprazole  (ABILIFY) 5 MG tablet Take 1 tablet (5 mg total) by mouth daily. Patient taking differently: Take 10 mg by mouth daily. 04/02/21   Clapacs, Madie Reno, MD  aspirin 81 MG chewable tablet Chew 81 mg by mouth daily.    [provider]  atorvastatin (LIPITOR) 20 MG tablet Take 20 mg by mouth at bedtime.    [provider]  barrier cream (NON-SPECIFIED) CREA Apply 1 application topically as needed (post-toileting skin protection).    [provider]  clonazePAM (KLONOPIN) 0.5 MG tablet Take 1 mg by mouth in the morning, at noon, and at bedtime.    [provider]  diphenhydrAMINE (BENADRYL) 25 MG tablet Take 25 mg by mouth every 6 (six) hours as needed for allergies or itching.    [provider]  docusate sodium (COLACE) 100 MG capsule Take 1 capsule (100 mg total) by mouth daily. 05/22/21   Billey Co, MD  DULoxetine (CYMBALTA) 30 MG capsule Take 90 mg by mouth daily.    [provider]  ergocalciferol (VITAMIN D2) 1.25 MG (50000 UT) capsule Take 50,000 Units by mouth once a week.    [provider]  fluticasone (FLONASE) 50 MCG/ACT nasal spray Place 2 sprays into both nostrils daily.    [provider]  gabapentin (NEURONTIN) 300 MG capsule Take 900 mg by mouth at bedtime.    [provider]  guaifenesin (ROBITUSSIN) 100 MG/5ML syrup Take 300 mg by mouth every 6 (six) hours as needed for cough.    [provider]  hydrocortisone cream 0.5 % Apply 1 application topically 2 (two) times daily. (Apply to left temple and under left eye)    [provider]  hydrOXYzine (VISTARIL) 50 MG capsule Take 50 mg by mouth every 8 (eight) hours as needed (agitation).    [provider]  ibuprofen (ADVIL) 800 MG tablet Take 800 mg by mouth every 6 (six) hours as needed for moderate pain.    [provider]  liothyronine (CYTOMEL) 25 MCG tablet Take 50 mcg by mouth daily.    [provider]   lisinopril (ZESTRIL) 40 MG tablet Take 40 mg by mouth daily.    [provider]  loperamide (IMODIUM) 2 MG capsule Take 4 mg by mouth every 4 (four) hours as needed for diarrhea or loose stools. (Max 8 doses in 24 hours)    [provider]  magnesium hydroxide (MILK OF MAGNESIA) 400 MG/5ML suspension Take 30 mLs by mouth daily as needed for mild constipation.    [provider]  meloxicam (MOBIC) 15 MG tablet Take 15 mg by mouth daily.    [provider]  metoprolol tartrate (LOPRESSOR) 25 MG tablet Take 25 mg by mouth 2 (two) times daily. 07/08/21   [provider]  neomycin-bacitracin-polymyxin (NEOSPORIN) [phone] ointment Apply 1 application topically daily as needed (wound care). (Apply to right big toe)    [provider]  nitrofurantoin (MACRODANTIN) 50 MG capsule Take 1 capsule (50 mg total) by mouth daily. Begin after completed after doxycycline 05/22/21   Billey Co, MD  ondansetron (ZOFRAN ODT) 4 MG disintegrating tablet Take 1 tablet (4 mg total) by  mouth every 8 (eight) hours as needed for nausea or vomiting. 08/10/21   Carrie Mew, MD  polyethylene glycol powder [facility]) 17 GM/SCOOP powder Take 17 g by mouth once.    [provider]  polyvinyl alcohol (LIQUIFILM TEARS) 1.4 % ophthalmic solution Place 2 drops into both eyes daily.    [provider]  traZODone (DESYREL) 50 MG tablet Take 50 mg by mouth at bedtime.    [provider]    Allergies Patient has no known allergies.  Family History  Problem Relation Age of Onset   Heart disease Neg Hx     Social History Social History   Tobacco Use   Smoking status: Never   Smokeless tobacco: Never  Vaping Use   Vaping Use: Never used  Substance Use Topics   Alcohol use: Not Currently   Drug use: Not Currently    Review of Systems Constitutional: No fever/chills Eyes: No visual changes. ENT: No sore  throat. Cardiovascular: Denies chest pain. Respiratory: Denies shortness of breath. Gastrointestinal: Postprandial abdominal pain and diarrhea.  No nausea nor vomiting. Genitourinary: Negative for dysuria. Musculoskeletal: Negative for neck pain.  Negative for back pain. Integumentary: Negative for rash. Neurological: Negative for headaches, focal weakness or numbness. Psychiatric: Depression with intermittent SI.  ____________________________________________   PHYSICAL EXAM:  VITAL SIGNS: ED Triage Vitals  Enc Vitals Group     BP 10/04/21 1446 102/74     Pulse Rate 10/04/21 1446 (!) 55     Resp 10/04/21 1446 17     Temp 10/04/21 1446 98.3 F (36.8 C)     Temp Source 10/04/21 1446 Oral     SpO2 10/04/21 1446 94 %     Weight 10/04/21 1456 77.1 kg (170 lb)     Height 10/04/21 1456 1.702 m (5\' 7" )     Head Circumference --      Peak Flow --      Pain Score 10/04/21 1455 7     Pain Loc --      Pain Edu? --      Excl. in [place]? --     Constitutional: Alert and oriented.  Eyes: Conjunctivae are normal.  Head: Atraumatic. Nose: No congestion/rhinnorhea. Mouth/Throat: Patient is wearing a mask. Neck: No stridor.  No meningeal signs.   Cardiovascular: Normal rate, regular rhythm. Good peripheral circulation. Respiratory: Normal respiratory effort.  No retractions. Gastrointestinal: Soft and nontender. No distention.  Musculoskeletal: No lower extremity tenderness nor edema. No gross deformities of extremities. Neurologic: Responsive slowly from prior CVA and has limited hearing, better out of the right ear, but otherwise no acute neurological deficits. Skin:  Skin is warm, dry and intact. Psychiatric: Mood and affect are somewhat depressed which seems to be his baseline, but he is denying SI and HI at this time.  He says that he has them as passing thoughts but he adamantly shook his head no when asked if he would ever kill himself.  ____________________________________________    LABS (all labs ordered are listed, but only abnormal results are displayed)  Labs Reviewed  COMPREHENSIVE METABOLIC PANEL - Abnormal; Notable for the following components:      Result Value   Glucose, Bld 101 (*)    Creatinine, Ser 1.28 (*)    Alkaline Phosphatase 144 (*)    GFR, Estimated 59 (*)    All other components within normal limits  CBC - Abnormal; Notable for the following components:   HCT 38.8 (*)    All other components  within normal limits  LIPASE, BLOOD  URINALYSIS, ROUTINE W REFLEX MICROSCOPIC   ____________________________________________  EKG  ED ECG REPORT I, Loleta Rose, the attending physician, personally viewed and interpreted this ECG.  Date: 10/04/2021 EKG Time: 15:08 Rate: 52 Rhythm: sinus bradycardia QRS Axis: normal Intervals: normal ST/T Wave abnormalities: normal Narrative Interpretation: no evidence of acute ischemia  ____________________________________________  RADIOLOGY I, Loleta Rose, personally viewed and evaluated these images (plain radiographs) as part of my medical decision making, as well as reviewing the written report by the radiologist.  ED MD interpretation: Bilateral groin hernias with some fat edema and bladder distortion but otherwise unremarkable.  Official radiology report(s): CT ABDOMEN PELVIS WO CONTRAST  Result Date: 10/05/2021 CLINICAL DATA:  Acute, nonlocalized abdominal pain EXAM: CT ABDOMEN AND PELVIS WITHOUT CONTRAST TECHNIQUE: Multidetector CT imaging of the abdomen and pelvis was performed following the standard protocol without IV contrast. COMPARISON:  04/02/2021 FINDINGS: Lower chest:  Mild scar-like opacity in the lower lungs. Hepatobiliary: No focal liver abnormality.No evidence of biliary obstruction or stone. Pancreas: Generalized atrophy Spleen: Unremarkable. Adrenals/Urinary Tract: Negative adrenals. No hydronephrosis or ureteral stone. 8 mm left upper pole renal calculus. Cystic density at the upper  pole left kidney. Distorted appearance of the upper bladder related to right groin hernia. Stomach/Bowel: No obstruction. Distal colonic diverticulosis. No acute inflammation. Vascular/Lymphatic: No acute vascular abnormality. No mass or adenopathy. Reproductive:No pathologic findings. Other: No ascites or pneumoperitoneum. Fatty bilateral inguinal hernia with new fat reticulation on the right. Musculoskeletal: No acute abnormalities. Advanced lumbar spine degeneration with scoliosis. IMPRESSION: 1. Bilateral groin hernia, larger on the right where there is some fat edema and bladder distortion that is new from June 2022. 2. Colonic diverticulosis and left nephrolithiasis. Electronically Signed   By: Tiburcio Pea M.D.   On: 10/05/2021 04:26    ____________________________________________   PROCEDURES   Procedure(s) performed (including Critical Care):  Procedures   ____________________________________________   INITIAL IMPRESSION / MDM / ASSESSMENT AND PLAN / ED COURSE  As part of my medical decision making, I reviewed the following data within the electronic MEDICAL RECORD NUMBER Nursing notes reviewed and incorporated, Labs reviewed , EKG interpreted , Old chart reviewed, and Notes from prior ED visits   Differential diagnosis includes, but is not limited to, depression, anxiety, personality disorder, SBO/ileus, viral infection, colitis, C. difficile, biliary colic.  Vital signs of been stable.  The patient has been in the emergency department for 16 hours as result of the patient's situation and complaints/concerns as well as limited staffing and high patient volume.  He has been stable throughout with reassuring vital signs and without any severe pain, diarrhea, nor vomiting.  No signs of infection.  Reassuring physical exam with no tenderness to palpation including in the lower abdomen/groin.  Although his CT scan demonstrates bilateral inguinal hernias with some changes on the right  compared to before, this seems unrelated to his current symptoms which are postprandial discomfort and diarrhea.  His labs are reassuring with a normal lipase, normal comprehensive metabolic panel other than slightly elevated alkaline phosphatase which is stable over the last month and a very slightly elevated creatinine with a normal BUN, and a normal CBC with no leukocytosis.  He has no urinary symptoms.  He has no chest pain or shortness of breath.  I specifically discussed his depression with him and the report of SI.  He adamantly denies any intention to kill himself and said that he may have said something in passing, particular  because he does not like the facility at which he is living, but he has no desire or intention to kill himself.  He was just evaluated by psychiatry within 24 hours and there is no indication to keep the patient for another psychiatric evaluation.  He stated to me twice that his main concern is his abdominal symptoms.  I tried to provide reassurance to him.  I reviewed his medical record and I see that he was prescribed milk of magnesia, MiraLAX, and docusate, as well as having as needed orders for Imodium.  I wrote in his discharge paperwork that I recommend that they hold his milk of magnesia, MiraLAX, and docusate for 24 to 48 hours to see if this helps with his diarrhea.  I also provided follow-up information with his PCP (Drs. Making hospitalist) as well as with a number to contact if he wishes to follow-up in general surgery clinic about the bilateral inguinal hernias.  The patient obviously is disappointed that I cannot fix the abdominal symptoms but he agrees with the plan for discharge back to his living facility.          ____________________________________________  FINAL CLINICAL IMPRESSION(S) / ED DIAGNOSES  Final diagnoses:  Abdominal discomfort  Diarrhea, unspecified type  Depression, unspecified depression type  Bilateral inguinal hernia without  obstruction or gangrene, recurrence not specified     MEDICATIONS GIVEN DURING THIS VISIT:  Medications  clonazePAM (KLONOPIN) tablet 0.5 mg (has no administration in time range)  alum & mag hydroxide-simeth (MAALOX/MYLANTA) 200-200-20 MG/5ML suspension 30 mL (has no administration in time range)  acetaminophen (TYLENOL) tablet 650 mg (650 mg Oral Given 10/04/21 2120)     ED Discharge Orders     None        Note:  This document was prepared using Dragon voice recognition software and may include unintentional dictation errors.   Hinda Kehr, MD 10/05/21 [phone]

## 2021-10-05 NOTE — ED Notes (Signed)
Patient awaiting EMS transport.  

## 2021-10-05 NOTE — Discharge Instructions (Addendum)
You were evaluated in the emergency department and cleared to return back to your living facility.  There is no indication of an emergent medical or psychiatric issue that requires hospitalization.  We recommend that you not take any MiraLAX, docusate, or milk of magnesia for at least 24 to 48 hours to see if this helps with your abdominal discomfort and diarrhea.  You have orders written for Imodium which may help firm up your stools.  Please follow-up with your primary care doctor at the next available opportunity; your facility should help by reaching out to them to schedule a follow-up appointment.  You have hernias in your groin but they do not seem to be causing a problem at this time.  However you can choose to follow-up with the surgery clinic for an appointment to discuss them and whether or not you would benefit from surgery.

## 2021-11-11 ENCOUNTER — Encounter: Payer: Self-pay | Admitting: Emergency Medicine

## 2021-11-11 ENCOUNTER — Emergency Department: Payer: Medicare Other

## 2021-11-11 ENCOUNTER — Other Ambulatory Visit: Payer: Self-pay

## 2021-11-11 ENCOUNTER — Emergency Department
Admission: EM | Admit: 2021-11-11 | Discharge: 2021-11-11 | Disposition: A | Discharge status: Home / Self Care | Payer: Medicare Other | Attending: Emergency Medicine | Admitting: Emergency Medicine

## 2021-11-11 DIAGNOSIS — R42 Dizziness and giddiness: Secondary | ICD-10-CM | POA: Insufficient documentation

## 2021-11-11 DIAGNOSIS — R413 Other amnesia: Secondary | ICD-10-CM | POA: Insufficient documentation

## 2021-11-11 DIAGNOSIS — I10 Essential (primary) hypertension: Secondary | ICD-10-CM | POA: Insufficient documentation

## 2021-11-11 DIAGNOSIS — Z8673 Personal history of transient ischemic attack (TIA), and cerebral infarction without residual deficits: Secondary | ICD-10-CM | POA: Diagnosis not present

## 2021-11-11 LAB — CBC
HCT: 37.7 % — ABNORMAL LOW (ref 39.0–52.0)
Hemoglobin: 12.6 g/dL — ABNORMAL LOW (ref 13.0–17.0)
MCH: 28.5 pg (ref 26.0–34.0)
MCHC: 33.4 g/dL (ref 30.0–36.0)
MCV: 85.3 fL (ref 80.0–100.0)
Platelets: 254 10*3/uL (ref 150–400)
RBC: 4.42 MIL/uL (ref 4.22–5.81)
RDW: 14.5 % (ref 11.5–15.5)
WBC: 6.4 10*3/uL (ref 4.0–10.5)
nRBC: 0 % (ref 0.0–0.2)

## 2021-11-11 LAB — BASIC METABOLIC PANEL
Anion gap: 9 (ref 5–15)
BUN: 14 mg/dL (ref 8–23)
CO2: 26 mmol/L (ref 22–32)
Calcium: 9.3 mg/dL (ref 8.9–10.3)
Chloride: 105 mmol/L (ref 98–111)
Creatinine, Ser: 1.23 mg/dL (ref 0.61–1.24)
GFR, Estimated: >60 mL/min (ref >60)
Glucose, Bld: 106 mg/dL — ABNORMAL HIGH (ref 70–99)
Potassium: 4.1 mmol/L (ref 3.5–5.1)
Sodium: 140 mmol/L (ref 135–145)

## 2021-11-11 NOTE — ED Provider Notes (Signed)
[facility] Provider Note    Event Date/Time   First MD Initiated Contact with Patient 11/11/21 1916     (approximate)   History   Chief Complaint Memory impairment  HPI  Johnny Rice is a 76 y.o. male with past medical history of hypertension, hyperlipidemia, stroke, anxiety, and somatic delusion disorder who presents to the ED complaining of memory impairment.  Patient reports that he has been having difficulty remembering things for about the past 3 months.  He states he will feel dizzy at times, like the room is spinning around him, however denies any dizziness currently.  He denies any changes to his vision or speech.  He reports left-sided deficits due to prior stroke but states these are no different than usual.  He has not had any fevers, cough, chest pain, shortness of breath, abdominal pain, nausea, or vomiting.  He is concerned that he could have had another stroke.     Physical Exam   Triage Vital Signs: ED Triage Vitals  Enc Vitals Group     BP 11/11/21 1709 99/63     Pulse Rate 11/11/21 1709 68     Resp 11/11/21 1709 16     Temp 11/11/21 1709 98.6 F (37 C)     Temp Source 11/11/21 1709 Oral     SpO2 11/11/21 1709 98 %     Weight 11/11/21 1704 169 lb 15.6 oz (77.1 kg)     Height 11/11/21 1704 5\' 7"  (1.702 m)     Head Circumference --      Peak Flow --      Pain Score 11/11/21 1704 0     Pain Loc --      Pain Edu? --      Excl. in [place]? --     Most recent vital signs: Vitals:   11/11/21 1709 11/11/21 2150  BP: 99/63 (!) 132/91  Pulse: 68 61  Resp: 16 17  Temp: 98.6 F (37 C)   SpO2: 98% 97%    Constitutional: Alert and oriented. Eyes: Conjunctivae are normal. Head: Atraumatic. Nose: No congestion/rhinnorhea. Mouth/Throat: Mucous membranes are moist.  Cardiovascular: Normal rate, regular rhythm. Grossly normal heart sounds.  2+ radial pulses bilaterally. Respiratory: Normal respiratory effort.  No retractions. Lungs  CTAB. Gastrointestinal: Soft and nontender. No distention. Musculoskeletal: No lower extremity tenderness nor edema.  Neurologic:  Normal speech and language. No gross focal neurologic deficits are appreciated.    ED Results / Procedures / Treatments   Labs (all labs ordered are listed, but only abnormal results are displayed) Labs Reviewed  BASIC METABOLIC PANEL - Abnormal; Notable for the following components:      Result Value   Glucose, Bld 106 (*)    All other components within normal limits  CBC - Abnormal; Notable for the following components:   Hemoglobin 12.6 (*)    HCT 37.7 (*)    All other components within normal limits  URINALYSIS, ROUTINE W REFLEX MICROSCOPIC  CBG MONITORING, ED     EKG  ED ECG REPORT I, 11/13/21, the attending physician, personally viewed and interpreted this ECG.   Date: 11/11/2021  EKG Time: 17:14  Rate: 64  Rhythm: normal sinus rhythm  Axis: LAD  Intervals:none  ST&T Change: None  RADIOLOGY CT head reviewed by me with old right-sided stroke, no evidence of hemorrhage or midline shift.  PROCEDURES:  Critical Care performed: No  Procedures   MEDICATIONS ORDERED IN ED: Medications - No data to  display   IMPRESSION / MDM / ASSESSMENT AND PLAN / ED COURSE  I reviewed the triage vital signs and the nursing notes.                              76 y.o. male with past medical history of hypertension, hyperlipidemia, stroke, anxiety, and somatic delusion disorder who presents to the ED complaining of memory difficulties over the past 3 months along with intermittent dizziness not present currently.  Differential diagnosis includes, but is not limited to, stroke, TIA intracranial hemorrhage, electrolyte abnormality, dementia, and anxiety.  Patient nontoxic-appearing and in no acute distress, no focal neurologic deficits noted on exam.  He reports difficulty with memory but is answering questions appropriately, alert and  oriented to person, place, time, and situation.  CT head is negative for acute process, demonstrates old stroke with no changes.  CBC and BMP are unremarkable with no anemia or electrolyte abnormality.  EKG shows no evidence of arrhythmia or ischemia and I doubt cardiac etiology for his dizziness.  No findings to suggest acute stroke at this time and patient is appropriate for discharge home with neurology follow-up.  He was counseled to return to the ED for new worsening symptoms, patient agrees with plan.       FINAL CLINICAL IMPRESSION(S) / ED DIAGNOSES   Final diagnoses:  Dizziness  Memory impairment  History of stroke     Rx / DC Orders   ED Discharge Orders     None        Note:  This document was prepared using Dragon voice recognition software and may include unintentional dictation errors.   Chesley Noon, MD 11/11/21 [phone]

## 2021-11-11 NOTE — ED Provider Triage Note (Signed)
Emergency Medicine Provider Triage Evaluation Note  Johnny Rice, a 76 y.o. male  was evaluated in triage.  Pt complains of dizziness. He presents via EMS from [facility]. 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   Review of Systems  Positive: dizziness Negative: FCS  Physical Exam  BP 99/63 (BP Location: Right Arm)    Pulse 68    Temp 98.6 F (37 C) (Oral)    Resp 16    Ht 5\' 7"  (1.702 m)    Wt 77.1 kg    SpO2 98%    BMI 26.62 kg/m  Gen:   Awake, no distress   Resp:  Normal effort  MSK:   Moves extremities without difficulty  Other:  CVS: RRR  Medical Decision Making  Medically screening exam initiated at 5:32 PM.  Appropriate orders placed.  Cheemeng Gawne was informed that the remainder of the evaluation will be completed by another provider, this initial triage assessment does not replace that evaluation, and the importance of remaining in the ED until their evaluation is complete.  Geriatric patient with ED evaluation of dizziness.    Melvenia Needles, PA-C 11/11/21 1741

## 2021-11-11 NOTE — ED Notes (Signed)
Called [facility] for transport @10 :15

## 2021-11-11 NOTE — ED Triage Notes (Signed)
First Nurse Note:  Arrives from [facility] [place] via [employer] -- c/o increased dizziness (more thannormal) and patient states "I feel like I'm loosing my mind".  Hx Dementia and alzheimer.

## 2022-02-26 ENCOUNTER — Ambulatory Visit: Payer: Medicare Other | Admitting: Urology

## 2022-02-28 ENCOUNTER — Encounter: Payer: Self-pay | Admitting: Urology

## 2022-03-29 IMAGING — CT CT ABD-PELV W/O CM
2 of 4 series · 17 of 46 positions shown, 19 images · non-contrast
Comparison: 04/02/2021

CLINICAL DATA: Acute, nonlocalized abdominal pain

EXAM:
CT ABDOMEN AND PELVIS WITHOUT CONTRAST
TECHNIQUE: Multidetector CT imaging of the abdomen and pelvis was performed
following the standard protocol without IV contrast.

[Series 3: routine abd/pel wo · axial · 0.85mm/px · z∈[-912,-437]mm · 14 of 105 slices shown, 16 images]
[im 5/105  soft-tissue]
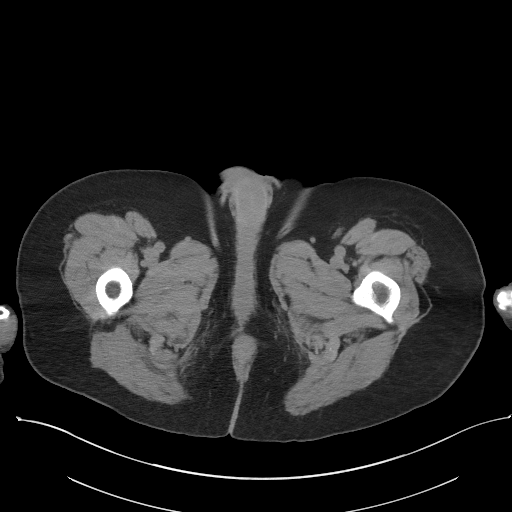
[im 5/105  bone]
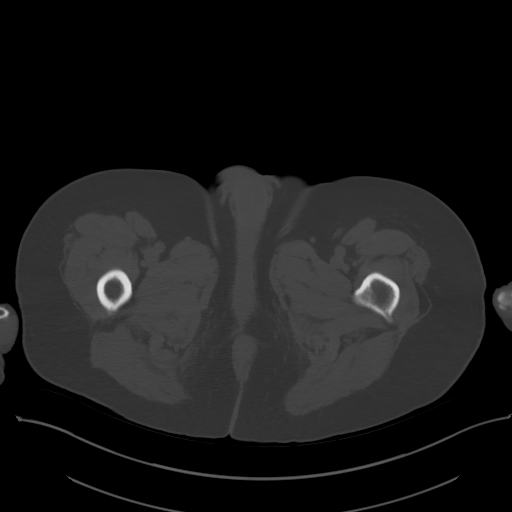
[im 15/105  soft-tissue]
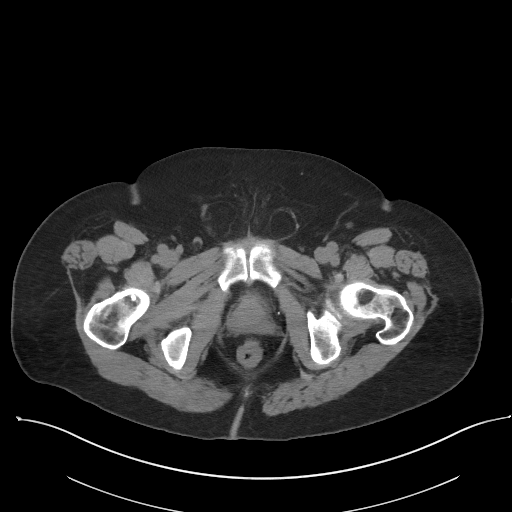
[im 19/105  soft-tissue]
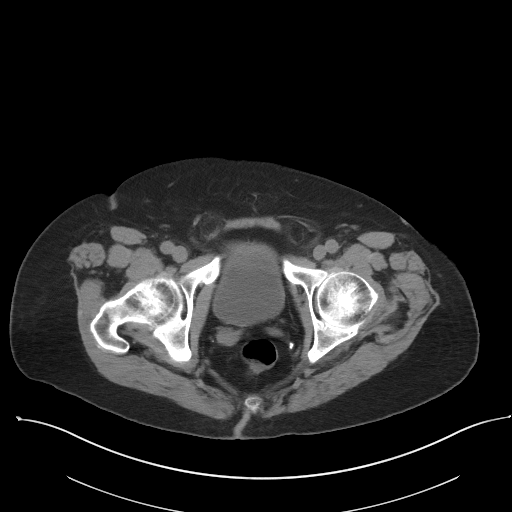
[im 29/105  soft-tissue]
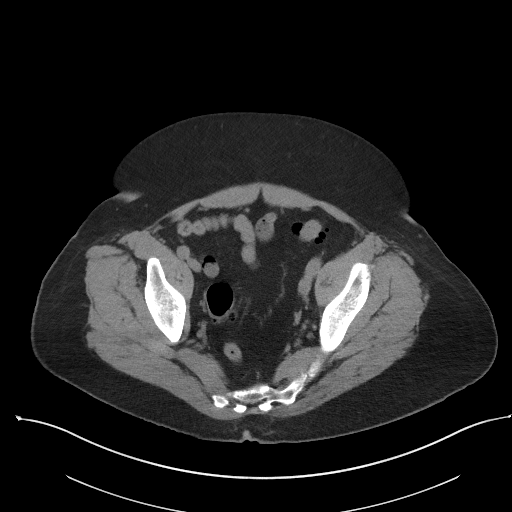
[im 34/105  soft-tissue]
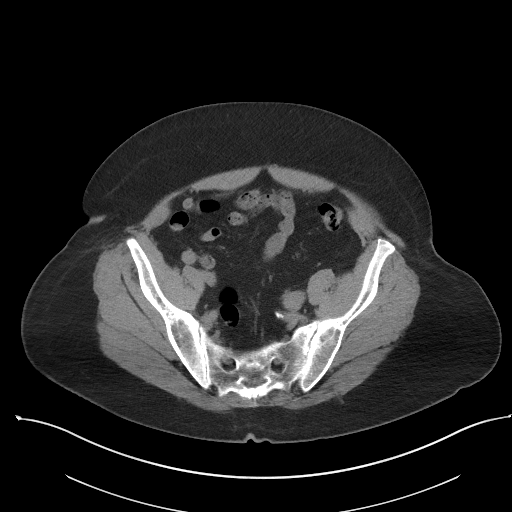
[im 43/105  soft-tissue]
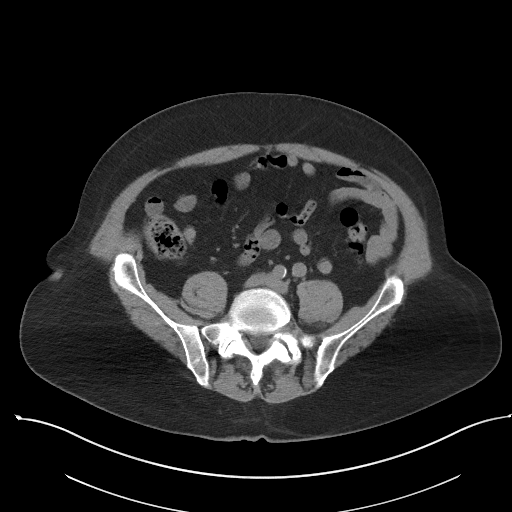
[im 48/105  soft-tissue]
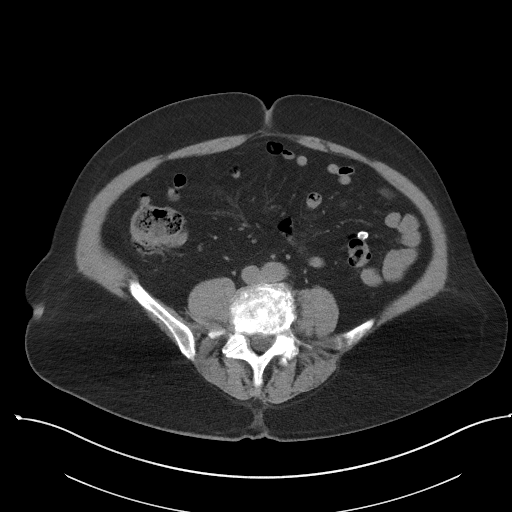
[im 57/105  soft-tissue]
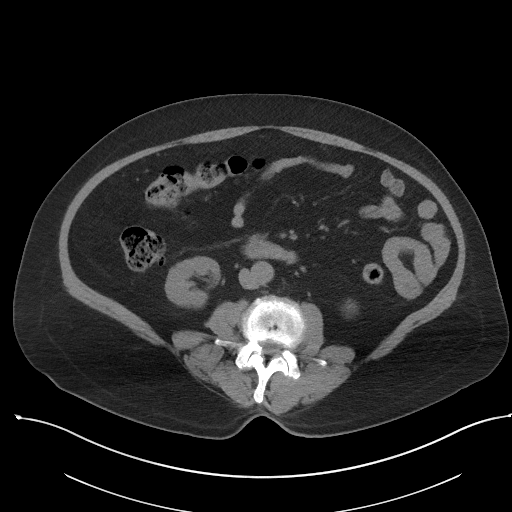
[im 62/105  soft-tissue]
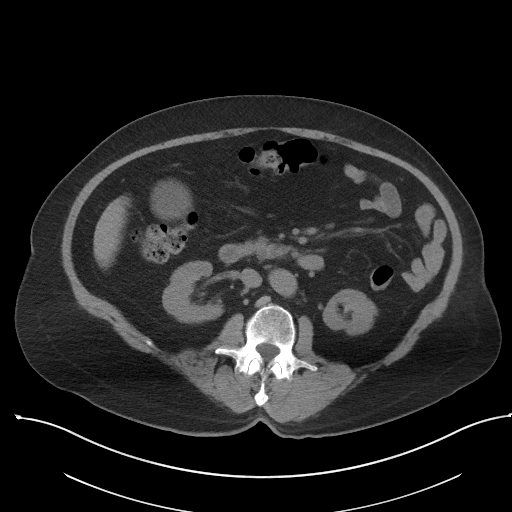
[im 62/105  bone]
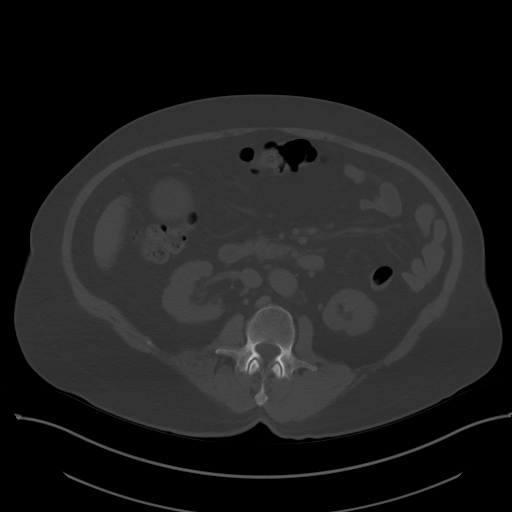
[im 71/105  soft-tissue]
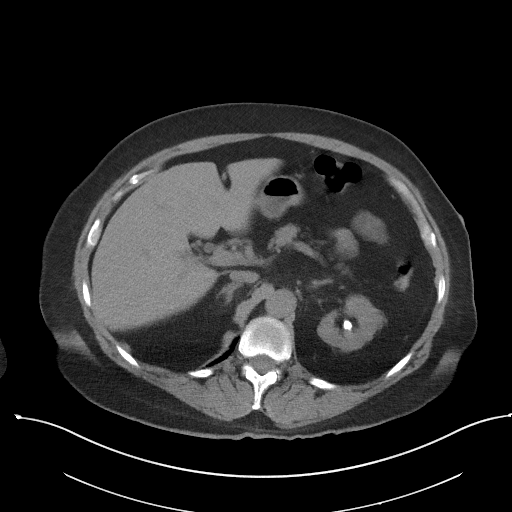
[im 76/105  soft-tissue]
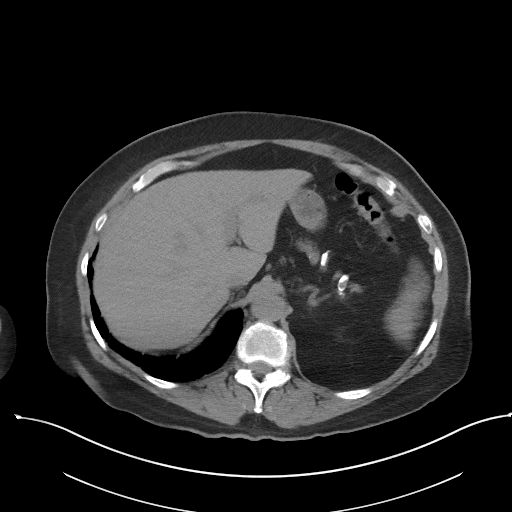
[im 86/105  soft-tissue]
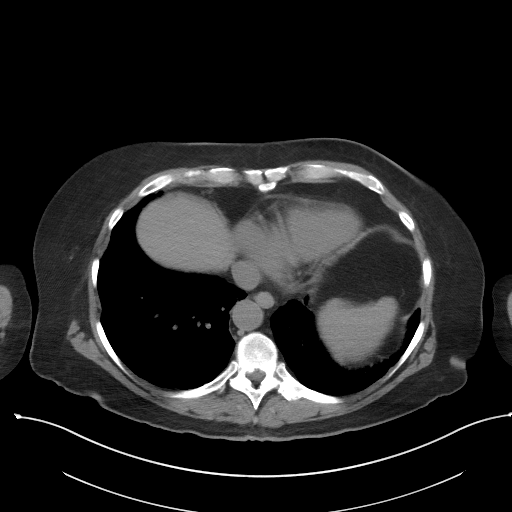
[im 90/105  soft-tissue]
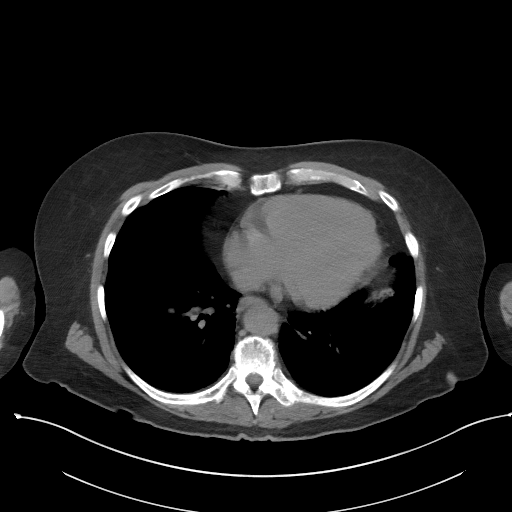
[im 100/105  soft-tissue]
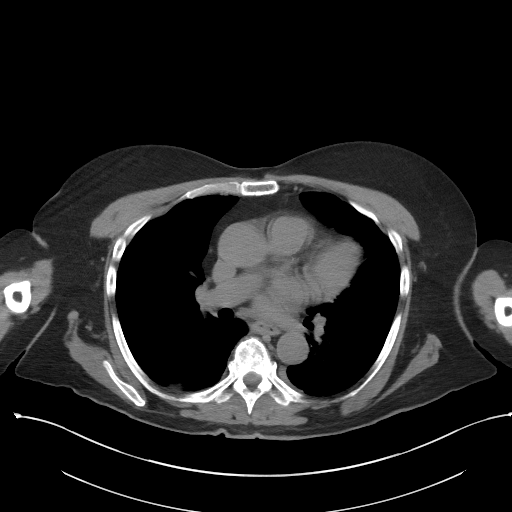

[Series 6: coronal st · coronal · 0.98mm/px · 3 of 100 slices shown]
[im 34/100  soft-tissue]
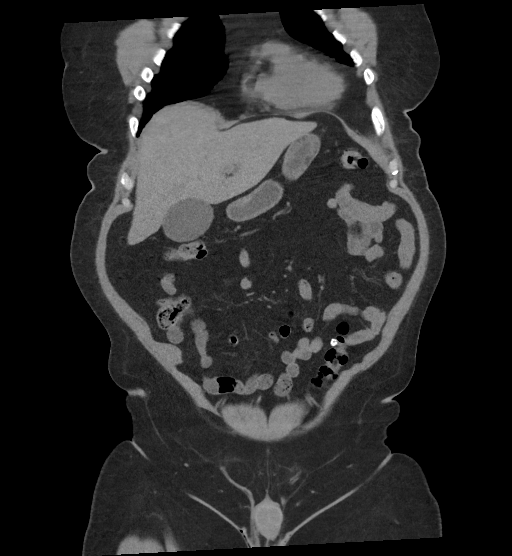
[im 45/100  soft-tissue]
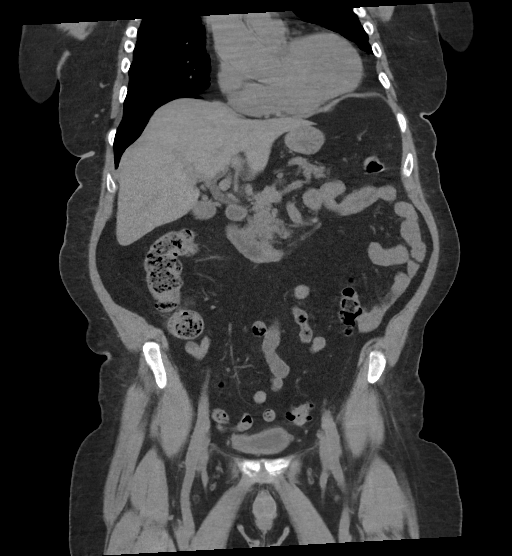
[im 56/100  soft-tissue]
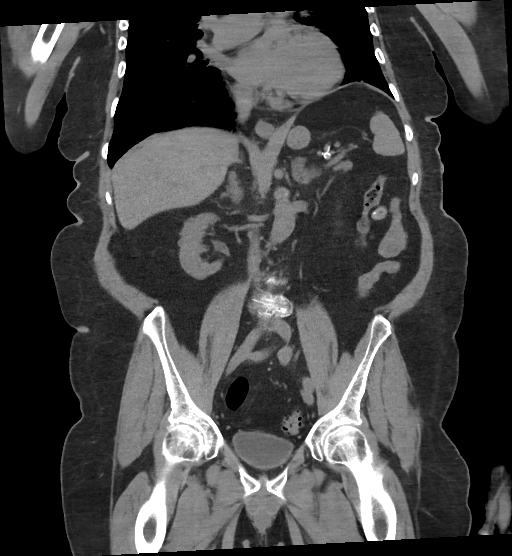

[17 of 46 positions shown; findings below may reference images not displayed]

FINDINGS: Lower chest:  Mild scar-like opacity in the lower lungs.

Hepatobiliary: No focal liver abnormality.No evidence of biliary
obstruction or stone.

Pancreas: Generalized atrophy

Spleen: Unremarkable.

Adrenals/Urinary Tract: Negative adrenals. No hydronephrosis or
ureteral stone. 8 mm left upper pole renal calculus. Cystic density
at the upper pole left kidney. Distorted appearance of the upper
bladder related to right groin hernia.

Stomach/Bowel: No obstruction. Distal colonic diverticulosis. No
acute inflammation.

Vascular/Lymphatic: No acute vascular abnormality. No mass or
adenopathy.

Reproductive:No pathologic findings.

Other: No ascites or pneumoperitoneum. Fatty bilateral inguinal
hernia with new fat reticulation on the right.

Musculoskeletal: No acute abnormalities. Advanced lumbar spine
degeneration with scoliosis.
IMPRESSION: 1. Bilateral groin hernia, larger on the right where there is some
fat edema and bladder distortion that is new from March 2021.
2. Colonic diverticulosis and left nephrolithiasis.

## 2022-04-17 ENCOUNTER — Other Ambulatory Visit: Payer: Self-pay

## 2022-04-17 ENCOUNTER — Emergency Department
Admission: EM | Admit: 2022-04-17 | Discharge: 2022-04-17 | Disposition: A | Discharge status: Home / Self Care | Payer: Medicare Other | Attending: Emergency Medicine | Admitting: Emergency Medicine

## 2022-04-17 ENCOUNTER — Emergency Department: Payer: Medicare Other

## 2022-04-17 DIAGNOSIS — W01198A Fall on same level from slipping, tripping and stumbling with subsequent striking against other object, initial encounter: Secondary | ICD-10-CM | POA: Diagnosis not present

## 2022-04-17 DIAGNOSIS — M542 Cervicalgia: Secondary | ICD-10-CM | POA: Insufficient documentation

## 2022-04-17 DIAGNOSIS — S0990XA Unspecified injury of head, initial encounter: Secondary | ICD-10-CM | POA: Diagnosis present

## 2022-04-17 MED ORDER — CLONAZEPAM 0.5 MG PO TABS
1.0000 mg | ORAL_TABLET | ORAL | Status: AC
Start: 2022-04-17 — End: 2022-04-17
  Administered 2022-04-17: 1 mg via ORAL
  Filled 2022-04-17: qty 2

## 2022-04-17 NOTE — ED Triage Notes (Signed)
[facility] reports pt coming from the [facility] after a fall. Pt was eating lunch and began choking and fell forward and hit the right side of his head. Staff reports no LOC.

## 2022-04-17 NOTE — ED Provider Notes (Signed)
[facility] Provider Note    Event Date/Time   First MD Initiated Contact with Patient 04/17/22 1244     (approximate)   History   Chief Complaint: Fall   HPI  Johnny Rice is a 76 y.o. male with a history of mental health issues, Bell's palsy, CVA who comes to the ED after a fall.  He reports he was eating fried chicken for lunch, when he felt like some of the chicken got stuck in his throat while swallowing.  This caused him to fall forward and hit the right side of his head.  No loss of consciousness.  He reports that during transport to the ED, the esophageal obstruction sensation passed and he can feel the food move down to his stomach.  He now feels asymptomatic except for some pain at the right parietal side of the head and mild neck pain.  No vision changes or paresthesias or weakness.     Physical Exam   Triage Vital Signs: ED Triage Vitals  Enc Vitals Group     BP 04/17/22 1232 102/70     Pulse Rate 04/17/22 1232 64     Resp 04/17/22 1232 18     Temp 04/17/22 1232 98 F (36.7 C)     Temp Source 04/17/22 1232 Oral     SpO2 04/17/22 1232 98 %     Weight --      Height --      Head Circumference --      Peak Flow --      Pain Score 04/17/22 1226 (S) 8     Pain Loc --      Pain Edu? --      Excl. in GC? --     Most recent vital signs: Vitals:   04/17/22 1232  BP: 102/70  Pulse: 64  Resp: 18  Temp: 98 F (36.7 C)  SpO2: 98%    General: Awake, no distress.  CV:  Good peripheral perfusion.  Regular rate rhythm Resp:  Normal effort.  Clear auscultation laterally Abd:  No distention.  Soft nontender Other:  Mild C-spine tenderness.  No other spinal tenderness.  Extremities are unremarkable with full range of motion.  Chronic left-sided facial droop.   ED Results / Procedures / Treatments   Labs (all labs ordered are listed, but only abnormal results are displayed) Labs Reviewed - No data to  display   EKG    RADIOLOGY CT head interpreted by me, negative for intracranial hemorrhage.  Radiology report reviewed.  CT cervical spine unremarkable.   PROCEDURES:  Procedures   MEDICATIONS ORDERED IN ED: Medications - No data to display   IMPRESSION / MDM / ASSESSMENT AND PLAN / ED COURSE  I reviewed the triage vital signs and the nursing notes.                              Differential diagnosis includes, but is not limited to, intracranial hemorrhage, skull fracture, C-spine fracture, mechanical fall, dysphagia  Patient's presentation is most consistent with acute complicated illness / injury requiring diagnostic workup.  76 year old man presents with mechanical fall occurring during an episode of esophageal obstruction from fried chicken.  Swallowing symptoms have now resolved.  He is back to baseline except for some mild headache as a result of the blunt head trauma.  Will obtain CT scan of the head and neck to evaluate for serious injury.  If reassuring, he can be discharged back home.       FINAL CLINICAL IMPRESSION(S) / ED DIAGNOSES   Final diagnoses:  Minor head injury, initial encounter     Rx / DC Orders   ED Discharge Orders     None        Note:  This document was prepared using Dragon voice recognition software and may include unintentional dictation errors.   Sharman Cheek, MD 04/17/22 [phone]

## 2022-04-23 ENCOUNTER — Emergency Department: Payer: Medicare Other

## 2022-04-23 ENCOUNTER — Inpatient Hospital Stay
Admission: EM | Admit: 2022-04-23 | Discharge: 2022-04-26 | DRG: 690 | Disposition: A | Discharge status: Home Health | Payer: Medicare Other | Attending: Internal Medicine | Admitting: Internal Medicine

## 2022-04-23 ENCOUNTER — Other Ambulatory Visit: Payer: Self-pay

## 2022-04-23 DIAGNOSIS — Y92009 Unspecified place in unspecified non-institutional (private) residence as the place of occurrence of the external cause: Secondary | ICD-10-CM

## 2022-04-23 DIAGNOSIS — Z7982 Long term (current) use of aspirin: Secondary | ICD-10-CM

## 2022-04-23 DIAGNOSIS — Z23 Encounter for immunization: Secondary | ICD-10-CM | POA: Diagnosis not present

## 2022-04-23 DIAGNOSIS — Z79899 Other long term (current) drug therapy: Secondary | ICD-10-CM | POA: Diagnosis not present

## 2022-04-23 DIAGNOSIS — I69392 Facial weakness following cerebral infarction: Secondary | ICD-10-CM

## 2022-04-23 DIAGNOSIS — S0181XA Laceration without foreign body of other part of head, initial encounter: Secondary | ICD-10-CM | POA: Diagnosis present

## 2022-04-23 DIAGNOSIS — R001 Bradycardia, unspecified: Secondary | ICD-10-CM | POA: Diagnosis present

## 2022-04-23 DIAGNOSIS — I10 Essential (primary) hypertension: Secondary | ICD-10-CM | POA: Diagnosis not present

## 2022-04-23 DIAGNOSIS — N400 Enlarged prostate without lower urinary tract symptoms: Secondary | ICD-10-CM | POA: Diagnosis not present

## 2022-04-23 DIAGNOSIS — B952 Enterococcus as the cause of diseases classified elsewhere: Secondary | ICD-10-CM | POA: Diagnosis present

## 2022-04-23 DIAGNOSIS — E039 Hypothyroidism, unspecified: Secondary | ICD-10-CM | POA: Diagnosis not present

## 2022-04-23 DIAGNOSIS — E785 Hyperlipidemia, unspecified: Secondary | ICD-10-CM | POA: Diagnosis present

## 2022-04-23 DIAGNOSIS — I69344 Monoplegia of lower limb following cerebral infarction affecting left non-dominant side: Secondary | ICD-10-CM | POA: Diagnosis not present

## 2022-04-23 DIAGNOSIS — F1011 Alcohol abuse, in remission: Secondary | ICD-10-CM | POA: Diagnosis present

## 2022-04-23 DIAGNOSIS — F32A Depression, unspecified: Secondary | ICD-10-CM | POA: Diagnosis present

## 2022-04-23 DIAGNOSIS — Z791 Long term (current) use of non-steroidal anti-inflammatories (NSAID): Secondary | ICD-10-CM | POA: Diagnosis not present

## 2022-04-23 DIAGNOSIS — I4430 Unspecified atrioventricular block: Secondary | ICD-10-CM | POA: Diagnosis not present

## 2022-04-23 DIAGNOSIS — W19XXXA Unspecified fall, initial encounter: Secondary | ICD-10-CM | POA: Diagnosis not present

## 2022-04-23 DIAGNOSIS — Y92129 Unspecified place in nursing home as the place of occurrence of the external cause: Secondary | ICD-10-CM

## 2022-04-23 DIAGNOSIS — I69328 Other speech and language deficits following cerebral infarction: Secondary | ICD-10-CM | POA: Diagnosis not present

## 2022-04-23 DIAGNOSIS — N3000 Acute cystitis without hematuria: Secondary | ICD-10-CM | POA: Diagnosis not present

## 2022-04-23 DIAGNOSIS — R7989 Other specified abnormal findings of blood chemistry: Secondary | ICD-10-CM

## 2022-04-23 DIAGNOSIS — N390 Urinary tract infection, site not specified: Principal | ICD-10-CM | POA: Diagnosis present

## 2022-04-23 DIAGNOSIS — S0101XA Laceration without foreign body of scalp, initial encounter: Secondary | ICD-10-CM | POA: Diagnosis present

## 2022-04-23 DIAGNOSIS — I951 Orthostatic hypotension: Secondary | ICD-10-CM | POA: Diagnosis not present

## 2022-04-23 DIAGNOSIS — Z8673 Personal history of transient ischemic attack (TIA), and cerebral infarction without residual deficits: Secondary | ICD-10-CM

## 2022-04-23 DIAGNOSIS — F419 Anxiety disorder, unspecified: Secondary | ICD-10-CM | POA: Diagnosis present

## 2022-04-23 DIAGNOSIS — W010XXA Fall on same level from slipping, tripping and stumbling without subsequent striking against object, initial encounter: Secondary | ICD-10-CM | POA: Diagnosis present

## 2022-04-23 DIAGNOSIS — H9190 Unspecified hearing loss, unspecified ear: Secondary | ICD-10-CM | POA: Diagnosis not present

## 2022-04-23 DIAGNOSIS — F418 Other specified anxiety disorders: Secondary | ICD-10-CM | POA: Diagnosis not present

## 2022-04-23 DIAGNOSIS — S0990XA Unspecified injury of head, initial encounter: Principal | ICD-10-CM

## 2022-04-23 LAB — CBC
HCT: 41.6 % (ref 39.0–52.0)
Hemoglobin: 13.6 g/dL (ref 13.0–17.0)
MCH: 28.5 pg (ref 26.0–34.0)
MCHC: 32.7 g/dL (ref 30.0–36.0)
MCV: 87 fL (ref 80.0–100.0)
Platelets: 254 10*3/uL (ref 150–400)
RBC: 4.78 MIL/uL (ref 4.22–5.81)
RDW: 13.7 % (ref 11.5–15.5)
WBC: 7.8 10*3/uL (ref 4.0–10.5)
nRBC: 0 % (ref 0.0–0.2)

## 2022-04-23 LAB — URINALYSIS, ROUTINE W REFLEX MICROSCOPIC
Bilirubin Urine: NEGATIVE
Glucose, UA: NEGATIVE mg/dL
Ketones, ur: NEGATIVE mg/dL
Nitrite: NEGATIVE
Protein, ur: NEGATIVE mg/dL
Specific Gravity, Urine: 1.014 (ref 1.005–1.030)
pH: 5 (ref 5.0–8.0)

## 2022-04-23 LAB — BASIC METABOLIC PANEL
Anion gap: 6 (ref 5–15)
BUN: 15 mg/dL (ref 8–23)
CO2: 24 mmol/L (ref 22–32)
Calcium: 9.1 mg/dL (ref 8.9–10.3)
Chloride: 110 mmol/L (ref 98–111)
Creatinine, Ser: 0.89 mg/dL (ref 0.61–1.24)
GFR, Estimated: >60 mL/min (ref >60)
Glucose, Bld: 94 mg/dL (ref 70–99)
Potassium: 3.9 mmol/L (ref 3.5–5.1)
Sodium: 140 mmol/L (ref 135–145)

## 2022-04-23 LAB — T4, FREE: Free T4: 0.51 ng/dL — ABNORMAL LOW (ref 0.61–1.12)

## 2022-04-23 LAB — TROPONIN I (HIGH SENSITIVITY): Troponin I (High Sensitivity): 4 ng/L (ref <18)

## 2022-04-23 LAB — TSH: TSH: 4.853 u[IU]/mL — ABNORMAL HIGH (ref 0.350–4.500)

## 2022-04-23 MED ORDER — ARIPIPRAZOLE 10 MG PO TABS
15.0000 mg | ORAL_TABLET | Freq: Every day | ORAL | Status: DC
  Administered 2022-04-23 – 2022-04-24 (×2): 15 mg via ORAL
  Filled 2022-04-23 (×2): qty 2

## 2022-04-23 MED ORDER — CLONAZEPAM 0.5 MG PO TABS
0.5000 mg | ORAL_TABLET | Freq: Three times a day (TID) | ORAL | Status: DC | PRN
  Administered 2022-04-23 – 2022-04-25 (×4): 0.5 mg via ORAL
  Filled 2022-04-23 (×5): qty 1

## 2022-04-23 MED ORDER — BUTALBITAL-APAP-CAFFEINE 50-325-40 MG PO TABS
1.0000 | ORAL_TABLET | Freq: Once | ORAL | Status: AC
Start: 2022-04-23 — End: 2022-04-23
  Administered 2022-04-23: 1 via ORAL
  Filled 2022-04-23: qty 1

## 2022-04-23 MED ORDER — ACETAMINOPHEN 500 MG PO TABS
1000.0000 mg | ORAL_TABLET | Freq: Once | ORAL | Status: AC
  Administered 2022-04-23: 1000 mg via ORAL
  Filled 2022-04-23: qty 2

## 2022-04-23 MED ORDER — HYDRALAZINE HCL 20 MG/ML IJ SOLN: 5.0000 mg | INTRAMUSCULAR | Status: DC | PRN

## 2022-04-23 MED ORDER — ATORVASTATIN CALCIUM 20 MG PO TABS
20.0000 mg | ORAL_TABLET | Freq: Every day | ORAL | Status: DC
  Administered 2022-04-23 – 2022-04-25 (×3): 20 mg via ORAL
  Filled 2022-04-23 (×3): qty 1

## 2022-04-23 MED ORDER — ALUM & MAG HYDROXIDE-SIMETH 200-200-20 MG/5ML PO SUSP: 30.0000 mL | Freq: Four times a day (QID) | ORAL | Status: DC | PRN

## 2022-04-23 MED ORDER — ENOXAPARIN SODIUM 40 MG/0.4ML IJ SOSY
40.0000 mg | PREFILLED_SYRINGE | INTRAMUSCULAR | Status: DC
  Administered 2022-04-23 – 2022-04-24 (×2): 40 mg via SUBCUTANEOUS
  Filled 2022-04-23 (×3): qty 0.4

## 2022-04-23 MED ORDER — ALFUZOSIN HCL ER 10 MG PO TB24
10.0000 mg | ORAL_TABLET | Freq: Every day | ORAL | Status: DC
  Administered 2022-04-24 – 2022-04-26 (×3): 10 mg via ORAL
  Filled 2022-04-23 (×3): qty 1

## 2022-04-23 MED ORDER — SODIUM CHLORIDE 0.9 % IV SOLN
1.0000 g | INTRAVENOUS | Status: DC
Start: 2022-04-24 — End: 2022-04-25
  Administered 2022-04-24: 1 g via INTRAVENOUS
  Filled 2022-04-23 (×2): qty 10

## 2022-04-23 MED ORDER — LIDOCAINE 5 % EX PTCH
1.0000 | MEDICATED_PATCH | CUTANEOUS | Status: DC
  Administered 2022-04-23 – 2022-04-26 (×4): 1 via TRANSDERMAL
  Filled 2022-04-23 (×4): qty 1

## 2022-04-23 MED ORDER — LISINOPRIL 20 MG PO TABS
40.0000 mg | ORAL_TABLET | Freq: Every day | ORAL | Status: DC
  Administered 2022-04-23 – 2022-04-24 (×2): 40 mg via ORAL
  Filled 2022-04-23 (×2): qty 2

## 2022-04-23 MED ORDER — ASPIRIN 81 MG PO CHEW
81.0000 mg | CHEWABLE_TABLET | Freq: Every day | ORAL | Status: DC
  Administered 2022-04-23 – 2022-04-26 (×4): 81 mg via ORAL
  Filled 2022-04-23 (×4): qty 1

## 2022-04-23 MED ORDER — LACTATED RINGERS IV BOLUS
1000.0000 mL | Freq: Once | INTRAVENOUS | Status: AC
  Administered 2022-04-23: 1000 mL via INTRAVENOUS

## 2022-04-23 MED ORDER — LIOTHYRONINE SODIUM 5 MCG PO TABS
30.0000 ug | ORAL_TABLET | Freq: Every day | ORAL | Status: DC
  Administered 2022-04-23 – 2022-04-26 (×4): 30 ug via ORAL
  Filled 2022-04-23 (×4): qty 1

## 2022-04-23 MED ORDER — POLYVINYL ALCOHOL 1.4 % OP SOLN: 2.0000 [drp] | Freq: Every day | OPHTHALMIC | Status: DC | PRN

## 2022-04-23 MED ORDER — TRAZODONE HCL 100 MG PO TABS
100.0000 mg | ORAL_TABLET | Freq: Every day | ORAL | Status: DC
  Administered 2022-04-23 – 2022-04-25 (×3): 100 mg via ORAL
  Filled 2022-04-23 (×3): qty 1

## 2022-04-23 MED ORDER — LOPERAMIDE HCL 2 MG PO CAPS
4.0000 mg | ORAL_CAPSULE | ORAL | Status: DC | PRN
Start: 2022-04-23 — End: 2022-04-26

## 2022-04-23 MED ORDER — ONDANSETRON HCL 4 MG/2ML IJ SOLN
4.0000 mg | Freq: Three times a day (TID) | INTRAMUSCULAR | Status: DC | PRN
  Administered 2022-04-24: 4 mg via INTRAVENOUS
  Filled 2022-04-23: qty 2

## 2022-04-23 MED ORDER — TETANUS-DIPHTH-ACELL PERTUSSIS 5-2.5-18.5 LF-MCG/0.5 IM SUSY
0.5000 mL | PREFILLED_SYRINGE | Freq: Once | INTRAMUSCULAR | Status: AC
  Administered 2022-04-23: 0.5 mL via INTRAMUSCULAR
  Filled 2022-04-23: qty 0.5

## 2022-04-23 MED ORDER — SODIUM CHLORIDE 0.9 % IV SOLN
1.0000 g | Freq: Once | INTRAVENOUS | Status: AC
  Administered 2022-04-23: 1 g via INTRAVENOUS
  Filled 2022-04-23: qty 10

## 2022-04-23 MED ORDER — DOCUSATE SODIUM 100 MG PO CAPS: 100.0000 mg | ORAL_CAPSULE | Freq: Every day | ORAL | Status: DC | PRN

## 2022-04-23 MED ORDER — LIDOCAINE-EPINEPHRINE 2 %-1:100000 IJ SOLN
20.0000 mL | Freq: Once | INTRAMUSCULAR | Status: AC
  Administered 2022-04-23: 20 mL via INTRADERMAL
  Filled 2022-04-23: qty 1

## 2022-04-23 MED ORDER — AMLODIPINE BESYLATE 5 MG PO TABS
5.0000 mg | ORAL_TABLET | Freq: Every day | ORAL | Status: DC
  Administered 2022-04-23 – 2022-04-24 (×2): 5 mg via ORAL
  Filled 2022-04-23 (×2): qty 1

## 2022-04-23 MED ORDER — MELOXICAM 7.5 MG PO TABS
15.0000 mg | ORAL_TABLET | Freq: Every day | ORAL | Status: DC
  Administered 2022-04-23 – 2022-04-26 (×4): 15 mg via ORAL
  Filled 2022-04-23 (×4): qty 2

## 2022-04-23 MED ORDER — ACETAMINOPHEN 325 MG PO TABS
650.0000 mg | ORAL_TABLET | Freq: Four times a day (QID) | ORAL | Status: DC | PRN
  Administered 2022-04-23 – 2022-04-26 (×7): 650 mg via ORAL
  Filled 2022-04-23 (×8): qty 2

## 2022-04-23 NOTE — Assessment & Plan Note (Signed)
Continue Abilify, trazodone -Decreased clonazepam dose from 1 to 0.5 mg 3 times daily prn -Hold hydroxyzine, Cymbalta,

## 2022-04-23 NOTE — Assessment & Plan Note (Signed)
-   Aspirin, Lipitor  

## 2022-04-23 NOTE — Assessment & Plan Note (Signed)
Alfuzosin

## 2022-04-23 NOTE — H&P (Signed)
History and Physical    Johnny Rice WUJ:[id number] DOB: 03-Feb-1946 DOA: 04/23/2022  Referring MD/NP/PA:   PCP: [facility]   Patient coming from:  The patient is coming from SNF  Chief Complaint: fall  HPI: Johnny Rice is a 76 y.o. male with medical history significant of hypertension, hyperlipidemia, stroke with some residual left lower extremity weakness left-sided facial droop and abnormal speech, hypothyroidism, depression, anxiety, alcohol abuse in remission, BPH, hard of hearing, mild cognitive impairment, benzo use disorder, who presents with fall.  Patient had unwitnessed fall in the facility.  Patient cannot tell exactly what happened.  Denies loss of consciousness.  Patient is oriented x3.  Denies chest pain, cough, shortness breath.  No nausea, vomiting, diarrhea or abdominal pain.  Denies symptoms of UTI.  He has left-sided head scalp laceration, which is sutured up by EDP.  Of note, patient has multiple sedative medications on medication list, including hydroxyzine, Abilify, Cymbalta, trazodone, Klonopin, Neurontin, Benadryl.  Data reviewed independently and ED Course: pt was found to have WBC 7.8, troponin level 4, urinalysis (clear appearance, small amount of leukocyte, rare bacteria, WBC 21-50), GFR> 60, temperature normal, blood pressure 161/94, heart rate 43, 61, 55, oxygen saturation 95% on room air.  CT head is negative for acute intracranial abnormalities.  CT of C-spine is negative for acute injury.  X-ray of lumbar spine and pelvis negative for acute bony fracture.  Patient is placed on MedSurg bed for observation   EKG: I have personally reviewed.  Sinus rhythm, QTc 400, heart rate 44, poor R wave progression, mild T wave inversion in V3-V5.   Review of Systems:   General: no fevers, chills, no body weight gain, has fatigue HEENT: no blurry vision, sore throat. HOH Respiratory: no dyspnea, coughing, wheezing CV: no chest pain, no palpitations GI:  no nausea, vomiting, abdominal pain, diarrhea, constipation GU: no dysuria, burning on urination, increased urinary frequency, hematuria  Ext: no leg edema Neuro: no unilateral weakness, numbness, or tingling, no vision change or hearing loss. Has fall Skin: no rash. Has scalp laceration MSK: No muscle spasm, no deformity, no limitation of range of movement in spin Heme: No easy bruising.  Travel history: No recent long distant travel.   Allergy: No Known Allergies  Past Medical History:  Diagnosis Date   Hypertension    Stroke [facility])     Past Surgical History:  Procedure Laterality Date   APPENDECTOMY      Social History:  reports that he has never smoked. He has never used smokeless tobacco. He reports that he does not currently use alcohol. He reports that he does not currently use drugs.  Family History:  Family History  Problem Relation Age of Onset   Heart disease Neg Hx      Prior to Admission medications   Medication Sig Start Date End Date Taking? Authorizing Provider  alfuzosin (UROXATRAL) 10 MG 24 hr tablet Take 10 mg by mouth in the morning.   Yes [provider]  amLODipine (NORVASC) 5 MG tablet Take 1 tablet by mouth daily. 03/12/21  Yes [provider]  ARIPiprazole (ABILIFY) 15 MG tablet Take 15 mg by mouth daily.   Yes [provider]  aspirin 81 MG chewable tablet Chew 81 mg by mouth daily.   Yes [provider]  atorvastatin (LIPITOR) 20 MG tablet Take 20 mg by mouth at bedtime.   Yes [provider]  ergocalciferol (VITAMIN D2) 1.25 MG (50000 UT) capsule Take 50,000 Units  by mouth once a week.   Yes [provider]  hydrOXYzine (VISTARIL) 50 MG capsule Take 50 mg by mouth every 8 (eight) hours as needed (agitation).   Yes [provider]  liothyronine (CYTOMEL) 25 MCG tablet Take 25 mcg by mouth daily.   Yes [provider]  lisinopril (ZESTRIL) 40 MG tablet Take 40 mg by mouth daily.    Yes [provider]  magnesium hydroxide (MILK OF MAGNESIA) 400 MG/5ML suspension Take 30 mLs by mouth daily as needed for mild constipation.   Yes [provider]  meloxicam (MOBIC) 15 MG tablet Take 15 mg by mouth daily.   Yes [provider]  metoprolol tartrate (LOPRESSOR) 25 MG tablet Take 25 mg by mouth 2 (two) times daily. 07/08/21  Yes [provider]  neomycin-bacitracin-polymyxin (NEOSPORIN) [id number] ointment Apply 1 application topically daily as needed (wound care). (Apply to right big toe)   Yes [provider]  ondansetron (ZOFRAN ODT) 4 MG disintegrating tablet Take 1 tablet (4 mg total) by mouth every 8 (eight) hours as needed for nausea or vomiting. 08/10/21  Yes Sharman CheekStafford, Phillip, MD  polyvinyl alcohol (LIQUIFILM TEARS) 1.4 % ophthalmic solution Place 2 drops into both eyes daily.   Yes [provider]  traZODone (DESYREL) 100 MG tablet Take 100 mg by mouth at bedtime.   Yes [provider]  acetaminophen (TYLENOL) 500 MG tablet Take 500 mg by mouth every 6 (six) hours as needed for mild pain. Patient not taking: Reported on 04/23/2022    [provider]  alum & mag hydroxide-simeth (MAALOX/MYLANTA) 200-200-20 MG/5ML suspension Take 30 mLs by mouth 4 (four) times daily as needed (heartburn or indigestion).    [provider]  ARIPiprazole (ABILIFY) 5 MG tablet Take 1 tablet (5 mg total) by mouth daily. Patient not taking: Reported on 04/23/2022 04/02/21   Clapacs, Jackquline DenmarkJohn T, MD  barrier cream (NON-SPECIFIED) CREA Apply 1 application topically as needed (post-toileting skin protection).    [provider]  clonazePAM (KLONOPIN) 0.5 MG tablet Take 1 mg by mouth in the morning, at noon, and at bedtime. Patient not taking: Reported on 04/23/2022    [provider]  docusate sodium (COLACE) 100 MG capsule Take 1 capsule (100 mg total) by mouth daily. 05/22/21   Sondra ComeSninsky, Brian C, MD  DULoxetine  (CYMBALTA) 30 MG capsule Take 90 mg by mouth daily. Patient not taking: Reported on 04/23/2022    [provider]  fluticasone (FLONASE) 50 MCG/ACT nasal spray Place 2 sprays into both nostrils daily. Patient not taking: Reported on 04/23/2022    [provider]  gabapentin (NEURONTIN) 300 MG capsule Take 900 mg by mouth at bedtime. Patient not taking: Reported on 04/23/2022    [provider]  guaifenesin (ROBITUSSIN) 100 MG/5ML syrup Take 300 mg by mouth every 6 (six) hours as needed for cough.    [provider]  hydrocortisone cream 0.5 % Apply 1 application topically 2 (two) times daily. (Apply to left temple and under left eye)    [provider]  ibuprofen (ADVIL) 800 MG tablet Take 800 mg by mouth every 6 (six) hours as needed for moderate pain.    [provider]  loperamide (IMODIUM) 2 MG capsule Take 4 mg by mouth every 4 (four) hours as needed for diarrhea or loose stools. (Max 8 doses in 24 hours)    [provider]  polyethylene glycol powder (GLYCOLAX/MIRALAX) 17 GM/SCOOP powder Take 17 g by mouth  once.    [provider]    Physical Exam: Vitals:   04/23/22 1545 04/23/22 1600 04/23/22 1659 04/23/22 1700  BP: (!) 156/85 (!) 138/109 133/90   Pulse: (!) 45 71 78   Resp: 14  18   Temp:   98.1 F (36.7 C)   TempSrc:      SpO2: 96% 95% 99%   Weight:    77.1 kg  Height:    5\' 6"  (1.676 m)   General: Not in acute distress HEENT:       Eyes: PERRL, EOMI, no scleral icterus.       ENT: No discharge from the ears and nose, no pharynx injection, no tonsillar enlargement.        Neck: No JVD, no bruit, no mass felt. Heme: No neck lymph node enlargement. Cardiac: S1/S2, RRR, No murmurs, No gallops or rubs. Respiratory: No rales, wheezing, rhonchi or rubs. GI: Soft, nondistended, nontender, no rebound pain, no organomegaly, BS present. GU: No hematuria Ext: No pitting leg edema bilaterally. 1+DP/PT pulse  bilaterally. Musculoskeletal: No joint deformities, No joint redness or warmth, no limitation of ROM in spin. Skin: No rashes.  Has right-sided scalp laceration which is sutured no active bleeding. Neuro: Alert, oriented X3, cranial nerves II-XII grossly intact, moves all extremities  Psych: Patient is not psychotic, no suicidal or hemocidal ideation.  Labs on Admission: I have personally reviewed following labs and imaging studies  CBC: Recent Labs  Lab 04/23/22 0902  WBC 7.8  HGB 13.6  HCT 41.6  MCV 87.0  PLT 254   Basic Metabolic Panel: Recent Labs  Lab 04/23/22 0902  NA 140  K 3.9  CL 110  CO2 24  GLUCOSE 94  BUN 15  CREATININE 0.89  CALCIUM 9.1   GFR: Estimated Creatinine Clearance: 72.2 mL/min (by C-G formula based on SCr of 0.89 mg/dL). Liver Function Tests: No results for input(s): "AST", "ALT", "ALKPHOS", "BILITOT", "PROT", "ALBUMIN" in the last 168 hours. No results for input(s): "LIPASE", "AMYLASE" in the last 168 hours. No results for input(s): "AMMONIA" in the last 168 hours. Coagulation Profile: No results for input(s): "INR", "PROTIME" in the last 168 hours. Cardiac Enzymes: No results for input(s): "CKTOTAL", "CKMB", "CKMBINDEX", "TROPONINI" in the last 168 hours. BNP (last 3 results) No results for input(s): "PROBNP" in the last 8760 hours. HbA1C: No results for input(s): "HGBA1C" in the last 72 hours. CBG: No results for input(s): "GLUCAP" in the last 168 hours. Lipid Profile: No results for input(s): "CHOL", "HDL", "LDLCALC", "TRIG", "CHOLHDL", "LDLDIRECT" in the last 72 hours. Thyroid Function Tests: Recent Labs    04/23/22 0902  TSH 4.853*  FREET4 0.51*   Anemia Panel: No results for input(s): "VITAMINB12", "FOLATE", "FERRITIN", "TIBC", "IRON", "RETICCTPCT" in the last 72 hours. Urine analysis:    Component Value Date/Time   COLORURINE YELLOW (A) 04/23/2022 0902   APPEARANCEUR CLEAR (A) 04/23/2022 0902   APPEARANCEUR Cloudy (A)  05/22/2021 1014   LABSPEC 1.014 04/23/2022 0902   PHURINE 5.0 04/23/2022 0902   GLUCOSEU NEGATIVE 04/23/2022 0902   HGBUR SMALL (A) 04/23/2022 0902   BILIRUBINUR NEGATIVE 04/23/2022 0902   BILIRUBINUR Negative 05/22/2021 1014   KETONESUR NEGATIVE 04/23/2022 0902   PROTEINUR NEGATIVE 04/23/2022 0902   NITRITE NEGATIVE 04/23/2022 0902   LEUKOCYTESUR SMALL (A) 04/23/2022 0902   Sepsis Labs: @LABRCNTIP (procalcitonin:4,lacticidven:4) )No results found for this or any previous visit (from the past 240 hour(s)).   Radiological Exams on Admission: DG Chest 2 View  Result  Date: 04/23/2022 CLINICAL DATA:  Fall, weakness EXAM: CHEST - 2 VIEW COMPARISON:  Chest x-ray 04/02/2021 FINDINGS: Heart size and vascularity normal. Mild patchy bibasilar airspace disease right greater than left. Probable atelectasis. Decreased lung volume. No effusion. IMPRESSION: Hypoventilation with mild bibasilar atelectasis. Electronically Signed   By: Marlan Palau M.D.   On: 04/23/2022 13:22   DG Pelvis Portable  Result Date: 04/23/2022 CLINICAL DATA:  Fall, weakness EXAM: PORTABLE PELVIS 1-2 VIEWS COMPARISON:  None Available. FINDINGS: There is no evidence of pelvic fracture or diastasis. No pelvic bone lesions are seen. Disc degeneration in the lumbar spine especially L3-4 IMPRESSION: Negative for fracture Electronically Signed   By: Marlan Palau M.D.   On: 04/23/2022 13:20   DG Lumbar Spine Complete  Result Date: 04/23/2022 CLINICAL DATA:  Fall. EXAM: LUMBAR SPINE - COMPLETE 4+ VIEW COMPARISON:  None Available. FINDINGS: Normal alignment no fracture Mild levoscoliosis. Advanced disc degeneration L3-4 and moderate disc degeneration L4-5 7 mm calcification overlying left kidney compatible with renal calculus. IMPRESSION: Negative for lumbar fracture.  Multilevel degenerative change. Left renal calculus Electronically Signed   By: Marlan Palau M.D.   On: 04/23/2022 13:20   CT Cervical Spine Wo Contrast  Result Date:  04/23/2022 CLINICAL DATA:  Neck trauma (Age >= 65y) EXAM: CT CERVICAL SPINE WITHOUT CONTRAST TECHNIQUE: Multidetector CT imaging of the cervical spine was performed without intravenous contrast. Multiplanar CT image reconstructions were also generated. RADIATION DOSE REDUCTION: This exam was performed according to the departmental dose-optimization program which includes automated exposure control, adjustment of the mA and/or kV according to patient size and/or use of iterative reconstruction technique. COMPARISON:  04/17/2022 FINDINGS: Alignment: Stable. Skull base and vertebrae: Stable vertebral body heights. No acute fracture. Soft tissues and spinal canal: No prevertebral fluid or swelling. No visible canal hematoma. Disc levels: Degenerative changes are stable over the short interval. Upper chest: No new finding. Other: No new finding. IMPRESSION: No acute cervical spine fracture. Electronically Signed   By: Guadlupe Spanish M.D.   On: 04/23/2022 11:18   CT HEAD WO CONTRAST  Result Date: 04/23/2022 CLINICAL DATA:  Provided history: Head trauma, moderate/severe. Additional history provided: Unwitnessed fall, forehead laceration. EXAM: CT HEAD WITHOUT CONTRAST TECHNIQUE: Contiguous axial images were obtained from the base of the skull through the vertex without intravenous contrast. RADIATION DOSE REDUCTION: This exam was performed according to the departmental dose-optimization program which includes automated exposure control, adjustment of the mA and/or kV according to patient size and/or use of iterative reconstruction technique. COMPARISON:  Prior head CT examinations 04/17/2022 and earlier. FINDINGS: Brain: Mild generalized cerebral and cerebellar atrophy. Large chronic right PCA territory cortical/subcortical infarct within the right temporal, occipital and parietal lobes. Associated ex vacuo dilatation of the right lateral ventricle. There is no acute intracranial hemorrhage. No acute demarcated cortical  infarct. No extra-axial fluid collection. No evidence of an intracranial mass. No midline shift. Vascular: No hyperdense vessel.  Atherosclerotic calcifications. Skull: No fracture or aggressive osseous lesion. Sinuses/Orbits: No mass or acute finding within the imaged orbits. Mild mucosal thickening within the bilateral ethmoid sinuses at the imaged levels. Other: Prior left mastoidectomy. Subtle right anterior scalp/forehead hematoma. IMPRESSION: 1. No evidence of acute intracranial abnormality. 2. Small right anterior scalp/forehead hematoma. 3. Redemonstrated large chronic right PCA territory infarct. 4. Background mild generalized parenchymal atrophy. 5. Mild bilateral ethmoid sinus mucosal thickening. 6. Prior left mastoidectomy. Electronically Signed   By: Jackey Loge D.O.   On: 04/23/2022 09:03  Assessment/Plan Principal Problem:   UTI (urinary tract infection) Active Problems:   Fall at home, initial encounter   Essential hypertension   HLD (hyperlipidemia)   History of CVA (cerebrovascular accident)   BPH (benign prostatic hyperplasia)   Hypothyroid   Depression with anxiety   Scalp laceration   Assessment and Plan: * UTI (urinary tract infection) Patient is not septic. -Placed on MedSurg bed for position -IV Rocephin -Follow-up urine culture  Fall at home, initial encounter Patient is on multiple sedative medications, also has bradycardia with heart rate down to 43 -Hold off metoprolol - Fall precaution -Patient will need PT OT in facility -Hold off her hydroxyzine, Cymbalta, Neurontin, Benadryl -Decrease Klonopin dosing from 1mg  to 0.5 mg 3 times daily as needed  Essential hypertension - Hold metoprolol due to bradycardia -IV hydralazine as needed -Amlodipine, lisinopril  HLD (hyperlipidemia) - Lipitor  History of CVA (cerebrovascular accident) - Aspirin, Lipitor  BPH (benign prostatic hyperplasia) - Alfuzosin  Hypothyroid TSH 4.853, free T4 slightly  decreased 0.51 - increase home dose of Liothyronine from 25 to 30 mcg daily -Will need to repeat TSH in 6 weeks  Depression with anxiety Continue Abilify, trazodone -Decreased clonazepam dose from 1 to 0.5 mg 3 times daily prn -Hold hydroxyzine, Cymbalta,  Scalp laceration - Wound care consult          DVT ppx: SQ Lovenox  Code Status: Full code  Family Communication:   Yes, patient's daughter  at bed side.       Disposition Plan:  Anticipate discharge back to previous environment, SNF  Consults called:  none  Admission status and Level of care: Med-Surg:    for obs     Severity of Illness:  The appropriate patient status for this patient is OBSERVATION. Observation status is judged to be reasonable and necessary in order to provide the required intensity of service to ensure the patient's safety. The patient's presenting symptoms, physical exam findings, and initial radiographic and laboratory data in the context of their medical condition is felt to place them at decreased risk for further clinical deterioration. Furthermore, it is anticipated that the patient will be medically stable for discharge from the hospital within 2 midnights of admission.        Date of Service 04/23/2022    06/24/2022 [facility]   If 7PM-7AM, please contact night-coverage [email or web address] 04/23/2022, 7:03 PM

## 2022-04-23 NOTE — ED Triage Notes (Signed)
Pt comes into the ED via EMS from the [facility] with an unwitnessed trip and fall with a lac to the fore head with controlled bleeding. Reports he is at baseline, confusion at baseline  150/80 HR40-55 CBG124 99%RA

## 2022-04-23 NOTE — Assessment & Plan Note (Signed)
Patient is on multiple sedative medications, also has bradycardia with heart rate down to 43 -Hold off metoprolol - Fall precaution -Patient will need PT OT in facility -Hold off her hydroxyzine, Cymbalta, Neurontin, Benadryl -Decrease Klonopin dosing from 1mg  to 0.5 mg 3 times daily as needed

## 2022-04-23 NOTE — Assessment & Plan Note (Signed)
Patient is not septic. -Placed on MedSurg bed for position -IV Rocephin -Follow-up urine culture

## 2022-04-23 NOTE — Plan of Care (Signed)

## 2022-04-23 NOTE — Assessment & Plan Note (Signed)
-   Hold metoprolol due to bradycardia -IV hydralazine as needed -Amlodipine, lisinopril

## 2022-04-23 NOTE — ED Notes (Signed)
Attempted IV x2 without success. Patient tolerated well. Other nurse notified of need

## 2022-04-23 NOTE — Plan of Care (Signed)
Pt arrived to unit AAOx3, moderate pain in head. VS are stable. Pt oriented to unit, Tele monitoring initiated. Daughter called and updated. Pt oriented to unit. Plan for pain control and rest. Bed is in lowest position, call light within reach. Will continue to monitor.

## 2022-04-23 NOTE — Assessment & Plan Note (Signed)
Lipitor 

## 2022-04-23 NOTE — Assessment & Plan Note (Signed)
TSH 4.853, free T4 slightly decreased 0.51 - increase home dose of Liothyronine from 25 to 30 mcg daily -Will need to repeat TSH in 6 weeks

## 2022-04-23 NOTE — ED Provider Notes (Signed)
[facility] Provider Note    Event Date/Time   First MD Initiated Contact with Patient 04/23/22 1101     (approximate)   History   Fall and Laceration   HPI  Johnny Rice is a 76 y.o. 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  Patient states he does not know why he fell.  States he only has a mild headache and some discomfort in his lower back.  He denies any chest pain, abdominal pain, nausea, vomiting, fevers or other acute sick symptoms.    Past Medical History:  Diagnosis Date   Hypertension    Stroke [facility])      Physical Exam  Triage Vital Signs: ED Triage Vitals [04/23/22 1130]  Enc Vitals Group     BP (!) 150/80     Pulse Rate (!) 43     Resp 16     Temp 97.7 F (36.5 C)     Temp Source Oral     SpO2 99 %     Weight      Height      Head Circumference      Peak Flow      Pain Score      Pain Loc      Pain Edu?      Excl. in GC?     Most recent vital signs: Vitals:   04/23/22 1207 04/23/22 1330  BP: (!) 165/89 (!) 161/94  Pulse: 61 (!) 55  Resp: 18 16  Temp: 98.1 F (36.7 C)   SpO2: 95% 98%    General: Awake, no distress.  CV:  Good peripheral perfusion.  2+ radial pulses.  Slightly bradycardic. Resp:  Normal effort.  Clear bilaterally. Abd:  No distention.  Soft. Other:  1 cm linear hemostatic laceration over the right eyebrow.  No other obvious trauma to the face scalp head or neck.  Some mild tenderness over the L-spine but no tenderness over the C or T-spine.   Left-sided facial droop and left lower extremity weakness compared to the right with otherwise full strength and intact cranial nerves.   ED Results / Procedures / Treatments  Labs (all labs ordered are listed, but only abnormal results are  displayed) Labs Reviewed  URINALYSIS, ROUTINE W REFLEX MICROSCOPIC - Abnormal; Notable for the following components:      Result Value   Color, Urine YELLOW (*)    APPearance CLEAR (*)    Hgb urine dipstick SMALL (*)    Leukocytes,Ua SMALL (*)    Bacteria, UA RARE (*)    All other components within normal limits  TSH - Abnormal; Notable for the following components:   TSH 4.853 (*)    All other components within normal limits  T4, FREE - Abnormal; Notable for the following components:   Free T4 0.51 (*)    All other components within normal limits  URINE CULTURE  BASIC METABOLIC PANEL  CBC  MAGNESIUM  TROPONIN I (HIGH SENSITIVITY)     EKG  EKG is remarkable for sinus bradycardia with a first-degree block with apparent.  Normal up to 36 with fairly low amplitude QRSs throughout and nonspecific ST changes in anterior lateral and inferior leads.  Questionable right axis deviation.   RADIOLOGY  CT head on my interpretation without evidence of a  skull fracture, intracranial hemorrhage or other clear acute intracranial process.  There is a large area of encephalomalacia consistent with remote infarct in the right posterior aspect and some bilateral ethmoid sinus thickening.  I reviewed radiology interpretation and agree their findings specifically including small right anterior scalp forehead hematoma, generalized parenchymal atrophy and prior left mastoidectomy.  CT C-spine my interpretation without evidence of acute fracture or traumatic listhesis.  There is some degenerative changes noted.  I also reviewed radiology's interpretation.  Chest reviewed by myself shows no focal consoidation, effusion, edema, pneumothorax or other clear acute thoracic process. I also reviewed radiology interpretation and agree with findings described.  X-ray of the L-spine interpretation without evidence of acute fracture dislocation.  I agree with radiology interpretation of some degenerative changes  and a left-sided kidney stone.  X-ray of the pelvis on my interpretation not evidence of acute fracture or dislocation.  I also reviewed radiologist interpretation and agree with the findings of no acute fracture or dislocation.  PROCEDURES:  Critical Care performed: No  ..Laceration Repair  Date/Time: 04/23/2022 2:32 PM  Performed by: Gilles Chiquito, MD Authorized by: Gilles Chiquito, MD   Consent:    Consent obtained:  Verbal   Risks discussed:  Infection and pain Universal protocol:    Procedure explained and questions answered to patient or proxy's satisfaction: yes     Patient identity confirmed:  Verbally with patient Anesthesia:    Anesthesia method:  Local infiltration   Local anesthetic:  Lidocaine 2% WITH epi Laceration details:    Location:  Face   Face location:  Forehead   Length (cm):  1 Skin repair:    Repair method:  Sutures   Suture size:  6-0   Suture material:  Fast-absorbing gut   Suture technique:  Simple interrupted   Number of sutures:  4 Approximation:    Approximation:  Close Repair type:    Repair type:  Simple Post-procedure details:    Dressing:  Open (no dressing)   Procedure completion:  Tolerated well, no immediate complications    MEDICATIONS ORDERED IN ED: Medications  lidocaine (LIDODERM) 5 % 1 patch (1 patch Transdermal Patch Applied 04/23/22 1229)  lactated ringers bolus 1,000 mL (has no administration in time range)  cefTRIAXone (ROCEPHIN) 1 g in sodium chloride 0.9 % 100 mL IVPB (has no administration in time range)  lidocaine-EPINEPHrine (XYLOCAINE W/EPI) 2 %-1:100000 (with pres) injection 20 mL (20 mLs Intradermal Given by Other 04/23/22 1237)  Tdap (BOOSTRIX) injection 0.5 mL (0.5 mLs Intramuscular Given 04/23/22 1229)  acetaminophen (TYLENOL) tablet 1,000 mg (1,000 mg Oral Given 04/23/22 1229)     IMPRESSION / MDM / ASSESSMENT AND PLAN / ED COURSE  I reviewed the triage vital signs and the nursing notes. Patient's presentation  is most consistent with acute presentation with potential threat to life or bodily function.                               Differential diagnosis includes, but is not limited to injuries including laceration of the scalp, skull fracture or intracranial hemorrhage as well as possible occult C-spine injury given patient was complaining of some C-spine pain earlier despite negative tenderness on exam as well as possible L-spine injury.  He is otherwise neurovascular intact with stable deficits from prior stroke and no other clear acute deficits at this time to suggest an acute CVA.   EKG is remarkable for  sinus bradycardia with a first-degree block with apparent.  Normal up to 36 with fairly low amplitude QRSs throughout and nonspecific ST changes in anterior lateral and inferior leads.  Questionable right axis deviation.  CT head on my interpretation without evidence of a skull fracture, intracranial hemorrhage or other clear acute intracranial process.  There is a large area of encephalomalacia consistent with remote infarct in the right posterior aspect and some bilateral ethmoid sinus thickening.  I reviewed radiology interpretation and agree their findings specifically including small right anterior scalp forehead hematoma, generalized parenchymal atrophy and prior left mastoidectomy.  CT C-spine my interpretation without evidence of acute fracture or traumatic listhesis.  There is some degenerative changes noted.  I also reviewed radiology's interpretation.  Chest reviewed by myself shows no focal consoidation, effusion, edema, pneumothorax or other clear acute thoracic process. I also reviewed radiology interpretation and agree with findings described.  X-ray of the L-spine interpretation without evidence of acute fracture dislocation.  I agree with radiology interpretation of some degenerative changes and a left-sided kidney stone.  X-ray of the pelvis on my interpretation not evidence of  acute fracture or dislocation.  I also reviewed radiologist interpretation and agree with the findings of no acute fracture or dislocation.  With regard to pulse etiology for his fall differential consideration include arrhythmia including possible symptomatic bradycardia given his heart rate is in the 40s when he first arrived, anemia, infectious process orthostasis vasovagal syncope and polypharmacy as he is on benzos as well as antipsychotics trazodone and possibly Benadryl as this was on his medication list and staff facility I spoke with are not sure if he is taking it..  He has no hypoxia tachypnea or tachycardia or complaints of chest pain or shortness of breath or evidence of asymmetric edema to suggest a PE at this time.  BMP without significant lecture metabolic derangements.  CBC without leukocytosis or acute anemia.  UA does appear infected with leukocyte esterase 21-50 WBCs and bacteria.  Urine culture sent.  Patient started on Rocephin.  He does not seem septic from this.  Nonelevated troponin and absence of chest pain and ECG is not suggestive this time of ACS.  TSH is high and free T4 is low possibly contributing to the dizziness and bradycardia although supposedly this is treated possibly undertreated.  I will admit to medicine service given he is fairly stable dizzy on several attempted ambulation for hydration given he does also orthostatic and management of his medications and treatment of his UTI.        FINAL CLINICAL IMPRESSION(S) / ED DIAGNOSES   Final diagnoses:  Injury of head, initial encounter  Fall, initial encounter  Facial laceration, initial encounter  Acute cystitis without hematuria  Elevated TSH     Rx / DC Orders   ED Discharge Orders     None        Note:  This document was prepared using Dragon voice recognition software and may include unintentional dictation errors.   Lucrezia Starch, MD 04/23/22 1537

## 2022-04-23 NOTE — Assessment & Plan Note (Signed)
-   Wound care consult 

## 2022-04-24 ENCOUNTER — Ambulatory Visit: Payer: Medicare Other | Admitting: Urology

## 2022-04-24 DIAGNOSIS — I10 Essential (primary) hypertension: Secondary | ICD-10-CM | POA: Diagnosis present

## 2022-04-24 DIAGNOSIS — Z79899 Other long term (current) drug therapy: Secondary | ICD-10-CM | POA: Diagnosis not present

## 2022-04-24 DIAGNOSIS — I69344 Monoplegia of lower limb following cerebral infarction affecting left non-dominant side: Secondary | ICD-10-CM | POA: Diagnosis not present

## 2022-04-24 DIAGNOSIS — I4430 Unspecified atrioventricular block: Secondary | ICD-10-CM | POA: Diagnosis present

## 2022-04-24 DIAGNOSIS — Z7982 Long term (current) use of aspirin: Secondary | ICD-10-CM | POA: Diagnosis not present

## 2022-04-24 DIAGNOSIS — W010XXA Fall on same level from slipping, tripping and stumbling without subsequent striking against object, initial encounter: Secondary | ICD-10-CM | POA: Diagnosis present

## 2022-04-24 DIAGNOSIS — N3000 Acute cystitis without hematuria: Secondary | ICD-10-CM | POA: Diagnosis not present

## 2022-04-24 DIAGNOSIS — Z23 Encounter for immunization: Secondary | ICD-10-CM | POA: Diagnosis present

## 2022-04-24 DIAGNOSIS — B952 Enterococcus as the cause of diseases classified elsewhere: Secondary | ICD-10-CM | POA: Diagnosis present

## 2022-04-24 DIAGNOSIS — Y92129 Unspecified place in nursing home as the place of occurrence of the external cause: Secondary | ICD-10-CM | POA: Diagnosis not present

## 2022-04-24 DIAGNOSIS — F1011 Alcohol abuse, in remission: Secondary | ICD-10-CM | POA: Diagnosis present

## 2022-04-24 DIAGNOSIS — F32A Depression, unspecified: Secondary | ICD-10-CM | POA: Diagnosis present

## 2022-04-24 DIAGNOSIS — I69328 Other speech and language deficits following cerebral infarction: Secondary | ICD-10-CM | POA: Diagnosis not present

## 2022-04-24 DIAGNOSIS — H9190 Unspecified hearing loss, unspecified ear: Secondary | ICD-10-CM | POA: Diagnosis present

## 2022-04-24 DIAGNOSIS — N390 Urinary tract infection, site not specified: Secondary | ICD-10-CM | POA: Diagnosis present

## 2022-04-24 DIAGNOSIS — S0181XA Laceration without foreign body of other part of head, initial encounter: Secondary | ICD-10-CM | POA: Diagnosis present

## 2022-04-24 DIAGNOSIS — F419 Anxiety disorder, unspecified: Secondary | ICD-10-CM | POA: Diagnosis present

## 2022-04-24 DIAGNOSIS — I951 Orthostatic hypotension: Secondary | ICD-10-CM | POA: Diagnosis present

## 2022-04-24 DIAGNOSIS — R001 Bradycardia, unspecified: Secondary | ICD-10-CM | POA: Diagnosis present

## 2022-04-24 DIAGNOSIS — E785 Hyperlipidemia, unspecified: Secondary | ICD-10-CM | POA: Diagnosis present

## 2022-04-24 DIAGNOSIS — N400 Enlarged prostate without lower urinary tract symptoms: Secondary | ICD-10-CM | POA: Diagnosis present

## 2022-04-24 DIAGNOSIS — I69392 Facial weakness following cerebral infarction: Secondary | ICD-10-CM | POA: Diagnosis not present

## 2022-04-24 DIAGNOSIS — Z791 Long term (current) use of non-steroidal anti-inflammatories (NSAID): Secondary | ICD-10-CM | POA: Diagnosis not present

## 2022-04-24 DIAGNOSIS — E039 Hypothyroidism, unspecified: Secondary | ICD-10-CM | POA: Diagnosis present

## 2022-04-24 MED ORDER — TRAMADOL HCL 50 MG PO TABS
50.0000 mg | ORAL_TABLET | Freq: Two times a day (BID) | ORAL | Status: DC | PRN
  Administered 2022-04-24 – 2022-04-26 (×3): 50 mg via ORAL
  Filled 2022-04-24 (×3): qty 1

## 2022-04-24 MED ORDER — SODIUM CHLORIDE 0.9 % IV SOLN: INTRAVENOUS | Status: DC

## 2022-04-24 NOTE — Evaluation (Signed)
Physical Therapy Evaluation Patient Details Name: Johnny Rice MRN: [id number] DOB: July 11, 1946 Today's Date: 04/24/2022  History of Present Illness  Johnny Rice is a 76 y.o. 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  Patient states he does not know why he fell.  States he only has a mild headache and some discomfort in his lower back.  He denies any chest pain, abdominal pain, nausea, vomiting, fevers or other acute sick symptoms.   Clinical Impression  Pt admitted with above diagnosis. Pt received upright agreeable to PT services.  Pt reports living at assisted living, ambulating with rollator at baseline. Requiring assist from staff with ADL's. Pt has history of CVA and is HOH at baseline but pt presenting with increased fatigue, appears difficulty with concentrating and increased time answering questions, and reports of nausea that pt reports is new since hitting his head with his fall.   To date pt performing all mobility without physical assist. Pt requires increased time for bed mobility using bed features with supervision. Able to stand to RW with minguard ambulating to<> from bathroom. Pt able to urinate in bathroom without need for UE support. Pt returning to room sitting in recliner with all needs in reach. Pt does require frequent, mod-max VC's throughout mobility for safe use of RW as pt demonstrates poor use of AD. Due to no physical assist needed, pt safe to D/c back to assisted living if 24/7 supervision can be provided from staff for mobility. Pt currently with functional limitations due to the deficits listed below (see PT Problem List). Pt will benefit from skilled PT to increase their independence and safety with mobility to allow discharge to the venue listed below.      Recommendations for follow up therapy are one component of a multi-disciplinary discharge planning process, led by the attending physician.  Recommendations may be updated based on patient status, additional functional criteria and insurance authorization.  Follow Up Recommendations Home health PT      Assistance Recommended at Discharge Frequent or constant Supervision/Assistance  Patient can return home with the following  A little help with walking and/or transfers;A little help with bathing/dressing/bathroom;Assistance with cooking/housework;Assist for transportation;Help with stairs or ramp for entrance    Equipment Recommendations None recommended by PT  Recommendations for Other Services       Functional Status Assessment Patient has had a recent decline in their functional status and demonstrates the ability to make significant improvements in function in a reasonable and predictable amount of time.     Precautions / Restrictions Precautions Precautions: Fall Restrictions Weight Bearing Restrictions: No      Mobility  Bed Mobility Overal bed mobility: Needs Assistance Bed Mobility: Supine to Sit     Supine to sit: Supervision, HOB elevated     General bed mobility comments: increased time Patient Response: Cooperative  Transfers Overall transfer level: Needs assistance Equipment used: Rolling walker (2 wheels) Transfers: Sit to/from Stand Sit to Stand: Min guard           General transfer comment: VC's for hand placement    Ambulation/Gait Ambulation/Gait assistance: Min guard Gait Distance (Feet): 40 Feet Assistive device: Rolling walker (2 wheels) Gait Pattern/deviations: Step-through pattern       General Gait Details: Slow and decreased cadence with RW  Stairs  Wheelchair Mobility    Modified Rankin (Stroke Patients Only)       Balance Overall balance assessment: No apparent balance deficits (not formally assessed),  Needs assistance Sitting-balance support: Bilateral upper extremity supported, Feet supported Sitting balance-Leahy Scale: Fair       Standing balance-Leahy Scale: Fair Standing balance comment: able to stand and urinate without UE support on RW                             Pertinent Vitals/Pain      Home Living Family/patient expects to be discharged to:: Assisted living                 Home Equipment: Rollator (4 wheels)      Prior Function Prior Level of Function : Needs assist               ADLs Comments: Staff assists with meals, bathing, dressing     Hand Dominance        Extremity/Trunk Assessment   Upper Extremity Assessment Upper Extremity Assessment: Defer to OT evaluation    Lower Extremity Assessment Lower Extremity Assessment: Generalized weakness    Cervical / Trunk Assessment Cervical / Trunk Assessment: Normal  Communication   Communication: HOH  Cognition Arousal/Alertness: Awake/alert   Overall Cognitive Status: Difficult to assess                                          General Comments      Exercises Other Exercises Other Exercises: Role of PT in acute setting, d/c recs   Assessment/Plan    PT Assessment Patient needs continued PT services  PT Problem List Decreased strength;Decreased mobility;Decreased safety awareness;Decreased activity tolerance;Decreased balance       PT Treatment Interventions DME instruction;Therapeutic exercise;Gait training;Balance training;Stair training;Neuromuscular re-education;Functional mobility training;Therapeutic activities;Patient/family education    PT Goals (Current goals can be found in the Care Plan section)  Acute Rehab PT Goals Patient Stated Goal: improve mobility to return to assissted living PT Goal Formulation: With patient Time For Goal Achievement: 05/08/22 Potential to Achieve Goals: Good    Frequency Min 2X/week     Co-evaluation                AM-PAC PT "6 Clicks" Mobility  Outcome Measure Help needed turning from your back to your side while in a flat bed without using bedrails?: A Little Help needed moving from lying on your back to sitting on the side of a flat bed without using bedrails?: A Little Help needed moving to and from a bed to a chair (including a wheelchair)?: A Little Help needed standing up from a chair using your arms (e.g., wheelchair or bedside chair)?: A Little Help needed to walk in hospital room?: A Little Help needed climbing 3-5 steps with a railing? : A Lot 6 Click Score: 17    End of Session Equipment Utilized During Treatment: Gait belt Activity Tolerance: Patient tolerated treatment well Patient left: in chair;with nursing/sitter in room;with call bell/phone within reach Nurse Communication: Mobility status PT Visit Diagnosis: Other abnormalities of gait and mobility (R26.89);Muscle weakness (generalized) (M62.81);Dizziness and giddiness (R42)    Time: 2595-6387 PT Time Calculation (min) (ACUTE ONLY): 26 min   Charges:   PT Evaluation $PT Eval Moderate Complexity: 1 Mod PT Treatments $Therapeutic Activity: 8-22 mins  Delphia Grates. Fairly IV, PT, DPT Physical Therapist- [place]  04/24/2022, 12:25 PM

## 2022-04-24 NOTE — Progress Notes (Addendum)
PROGRESS NOTE  Johnny Rice  DOB: Sep 29, 1946  PCP: Johnny Rice, [facility] [id number]  DOA: 04/23/2022  LOS: 0 days  Hospital Day: 2  Brief narrative: Johnny Rice is a 76 y.o. male with PMH significant for HTN, HLD, stroke with some residual left lower extremity weakness left-sided facial droop and abnormal speech, hypothyroidism, depression, anxiety, alcohol abuse in remission, BPH, hard of hearing, mild cognitive impairment, benzo use disorder who lives at [facility] facility. 7/5, patient was sent to the ED after an unwitnessed trip and fall sustaining a laceration to the forehead with controlled bleeding.  In the ED, patient was bradycardic to 40s, blood pressure in 150s, breathing on room air Labs with unremarkable CBC, BMP, negative troponin Urinalysis with clear yellow urine, small leukocytes, rare bacteria.  Urine culture sent CT head is negative for acute intracranial abnormalities.   CT of C-spine is negative for acute injury.  X-ray of lumbar spine and pelvis negative for acute bony fracture.   Admitted to hospital service 7/5, orthostatic vital signs positive with drop 165/89-144/102 on standing up.  Subjective: Patient was seen and examined this morning.  Pleasant elderly Caucasian male.  Alert, awake, oriented to place and person.  Hard of hearing. Baseline weakness in the left upper and lower extremities as well as facial asymmetry due to previous stroke. PT eval obtained.  Patient was weak and confused. Chart reviewed Heart rate at presentation was low in 40s but appropriate in 70s and 80s overnight  Assessment and plan: Unwitnessed fall sustaining a head laceration -Multifactorial etiology: UTI, sinus bradycardia, influence of multiple mood altering medication, orthostatic hypotension at the time -See below for individual issues. -PT eval obtained.  Looks weak.  To get reevaluation again tomorrow to see if qualifies to return back to  ALF.  Orthostatic hypotension  History of HTN -PTA on amlodipine 5 mg daily, lisinopril 40 mg daily -On 7/5, patient was noted to be orthostatic. -I would keep BP meds on hold at this time.  Starting on gentle IV hydration with 50 mill per hour normal saline.  UTI -Maintenance burning urine, frequency.   -UA showed clear yellow urine with small amount of leukocytes and rare bacteria. -Currently on IV Rocephin.  Pending culture report  Sinus bradycardia -Heart rate at presentation was low in 40s.  Metoprolol was held.  Improved overnight to 70s -Currently not on any AV nodal blocking agent.  Monitor on telemetry  History of CVA, HLD -with some residual left lower extremity weakness left-sided facial droop and abnormal speech -Continue aspirin and Lipitor.  Psychiatry history Anxiety/depression Mild cognitive impairment -PTA on Abilify, hydroxyzine, Cymbalta, Neurontin, Benadryl, Klonopin, trazodone -To minimize the risk of recurrent bradycardia, I will hold Abilify, Benadryl.  Patient states he does not want to make big changes without talking to his PCP.  But he understands the need to hold the above 2 medicines to improve his heart rate. - BPH (benign prostatic hyperplasia) -Continue alfuzosin  Hypothyroid TSH 4.853, free T4 slightly decreased 0.51 -increase home dose of Liothyronine from 25 to 30 mcg daily -Will need to repeat TSH in 6 weeks    Scalp laceration -Wound care consulted. Twice daily cleaning with normal saline recommended.  Wound to be left open to air.  Sutures to be removed typically after 10 to 14 days. -As needed tramadol for pain control.  Goals of care   Code Status: Full Code    Mobility: PT reevaluation tomorrow  Skin assessment:  Nutritional status:  Body mass index is 27.43 kg/m.          Diet:  Diet Order             Diet Heart Room service appropriate? Yes; Fluid consistency: Thin  Diet effective now                    DVT prophylaxis:  enoxaparin (LOVENOX) injection 40 mg Start: 04/23/22 2200   Antimicrobials: IV Rocephin Fluid: None at this time Consultants: None Family Communication: None at bedside  Status is: Observation  Continue in-hospital care because: Remains weak, pending reevaluation by PT tomorrow Level of care: Med-Surg   Dispo: The patient is from: Assisted living facility              Anticipated d/c is to: Hoping to discharge back to ALF tomorrow              Patient currently is not medically stable to d/c.   Difficult to place patient No     Infusions:   sodium chloride     cefTRIAXone (ROCEPHIN)  IV      Scheduled Meds:  alfuzosin  10 mg Oral Daily   aspirin  81 mg Oral Daily   atorvastatin  20 mg Oral QHS   enoxaparin (LOVENOX) injection  40 mg Subcutaneous Q24H   lidocaine  1 patch Transdermal Q24H   liothyronine  30 mcg Oral Daily   meloxicam  15 mg Oral Daily   traZODone  100 mg Oral QHS    PRN meds: acetaminophen, alum & mag hydroxide-simeth, clonazePAM, docusate sodium, hydrALAZINE, loperamide, ondansetron (ZOFRAN) IV, polyvinyl alcohol, traMADol   Antimicrobials: Anti-infectives (From admission, onward)    Start     Dose/Rate Route Frequency Ordered Stop   04/24/22 1500  cefTRIAXone (ROCEPHIN) 1 g in sodium chloride 0.9 % 100 mL IVPB        1 g 200 mL/hr over 30 Minutes Intravenous Every 24 hours 04/23/22 1856     04/23/22 1500  cefTRIAXone (ROCEPHIN) 1 g in sodium chloride 0.9 % 100 mL IVPB        1 g 200 mL/hr over 30 Minutes Intravenous  Once 04/23/22 1458 04/23/22 1720       Objective: Vitals:   04/24/22 0509 04/24/22 0839  BP: 123/81 140/84  Pulse: 73 85  Resp: 14 18  Temp: 98.4 F (36.9 C) 98.3 F (36.8 C)  SpO2: 94% 93%    Intake/Output Summary (Last 24 hours) at 04/24/2022 1245 Last data filed at 04/24/2022 1041 Gross per 24 hour  Intake 1035.31 ml  Output 1500 ml  Net -464.69 ml   Filed Weights   04/23/22 1700  Weight:  77.1 kg   Weight change:  Body mass index is 27.43 kg/m.   Physical Exam: General exam: Pleasant elderly Caucasian male.  Not in physical distress Skin: No rashes, lesions or ulcers. HEENT: Sutured wound on the right forehead and temple area.  No active bleeding. Lungs: Clear to auscultation bilaterally CVS: Regular rate and rhythm, no murmur GI/Abd soft, nontender, nondistended, bowel sound present CNS: Alert, awake, oriented to place and person.  Baseline deficits from stroke in the past Psychiatry: Mood appropriate Extremities: No pedal edema, no calf tenderness  Data Review: I have personally reviewed the laboratory data and studies available.  F/u labs ordered Unresulted Labs (From admission, onward)     Start     Ordered   04/23/22 1332  Urine Culture  Add-on,   AD       Question:  Indication  Answer:  Altered mental status (if no other cause identified)   04/23/22 1331            Signed, Lorin Glass, MD [facility] 04/24/2022

## 2022-04-24 NOTE — Consult Note (Signed)
WOC Nurse Consult Note: Reason for Consult:left scalp wound (laceration). Approximated by 4 sutures in the ED by Dr.Z.Smith.  Wound type:trauma Pressure Injury POA: N/A Measurement:1cm per Dr. Michaelle Copas note Wound bed:N/A Drainage (amount, consistency, odor) N/A Dressing procedure/placement/frequency: No dressing is indicated for this wound and ointments soften and moisturize the skin and do not facilitate reepithelialization. Twice daily cleansing with NS is indicated and wound left open to air. Nursing guidance is provided via the Orders.  Sutures to be removed at the MD's direction, typically after 10-14 days.  WOC nursing team will not follow, but will remain available to this patient, the nursing and medical teams.  Please re-consult if needed.  Thank you for inviting [place] to participate in this patient's Plan of Care.  Ladona Mow, MSN, RN, CNS, GNP, Leda Min, [employer], [employer] phone:  [phone]

## 2022-04-24 NOTE — Progress Notes (Addendum)
Mobility Specialist - Progress Note   04/24/22 1500  Mobility  Activity Transferred from chair to bed  Level of Assistance Minimal assist, patient does 75% or more  Assistive Device None  Distance Ambulated (ft) 3 ft  Activity Response Tolerated well  $Mobility charge 1 Mobility     Pt sitting in recliner upon arrival, utilizing RA. Pt really impulsive with poor safety awareness; attempts standing several times despite max cueing to wait until safe to do so. Pt also HOH. Pt attempts standing despite leg rest still up and no assist/AD next to patient (author on other side of room). Pt stood with minA and SPT to bed with pt getting into bed knee-first, again despite cueing for safe sitting. Pt returned supine and was left in bed with alarm set, needs in reach.    Filiberto Pinks Mobility Specialist 04/24/22, 3:24 PM

## 2022-04-25 DIAGNOSIS — N3000 Acute cystitis without hematuria: Secondary | ICD-10-CM | POA: Diagnosis not present

## 2022-04-25 DIAGNOSIS — R001 Bradycardia, unspecified: Secondary | ICD-10-CM

## 2022-04-25 LAB — URINE CULTURE: Culture: 30000 — AB

## 2022-04-25 MED ORDER — METOPROLOL TARTRATE 25 MG PO TABS
25.0000 mg | ORAL_TABLET | Freq: Two times a day (BID) | ORAL | Status: DC
  Administered 2022-04-25: 25 mg via ORAL
  Filled 2022-04-25: qty 1

## 2022-04-25 MED ORDER — KETOROLAC TROMETHAMINE 0.5 % OP SOLN
1.0000 [drp] | Freq: Four times a day (QID) | OPHTHALMIC | Status: DC
  Administered 2022-04-25 – 2022-04-26 (×4): 1 [drp] via OPHTHALMIC
  Filled 2022-04-25: qty 3

## 2022-04-25 MED ORDER — AMOXICILLIN-POT CLAVULANATE 875-125 MG PO TABS: 1.0000 | ORAL_TABLET | Freq: Two times a day (BID) | ORAL | 0 refills | Status: AC

## 2022-04-25 MED ORDER — CLONAZEPAM 0.25 MG PO TBDP
0.2500 mg | ORAL_TABLET | Freq: Two times a day (BID) | ORAL | Status: DC | PRN
  Administered 2022-04-25 – 2022-04-26 (×2): 0.25 mg via ORAL
  Filled 2022-04-25 (×2): qty 1

## 2022-04-25 MED ORDER — AMOXICILLIN-POT CLAVULANATE 875-125 MG PO TABS
1.0000 | ORAL_TABLET | Freq: Two times a day (BID) | ORAL | Status: DC
  Administered 2022-04-25 – 2022-04-26 (×2): 1 via ORAL
  Filled 2022-04-25 (×2): qty 1

## 2022-04-25 MED ORDER — AMOXICILLIN 500 MG PO CAPS
500.0000 mg | ORAL_CAPSULE | Freq: Two times a day (BID) | ORAL | Status: DC
  Administered 2022-04-25: 500 mg via ORAL
  Filled 2022-04-25: qty 1

## 2022-04-25 MED ORDER — KETOROLAC TROMETHAMINE 0.5 % OP SOLN: 1.0000 [drp] | Freq: Four times a day (QID) | OPHTHALMIC | 0 refills | Status: AC

## 2022-04-25 MED ORDER — ARIPIPRAZOLE 10 MG PO TABS
15.0000 mg | ORAL_TABLET | Freq: Every day | ORAL | Status: DC
Start: 2022-04-25 — End: 2022-04-26
  Administered 2022-04-25: 15 mg via ORAL
  Filled 2022-04-25: qty 2

## 2022-04-25 NOTE — Evaluation (Signed)
Occupational Therapy Evaluation Patient Details Name: Johnny Rice MRN: [id number] DOB: 1945/11/14 Today's Date: 04/25/2022   History of Present Illness pt is a 76 year old male admitted after fall at ALF sustained laceration to forehead with controlled bleeding. Incidential findings include orthostatic hypotension, UIT, sinus bradycardia PMH significant for HTN, HLD, stroke with some residual left lower extremity weakness left-sided facial droop and abnormal speech, hypothyroidism, depression, anxiety, alcohol abuse in remission, BPH, hard of hearing, mild cognitive impairment, benzo use disorder   Clinical Impression   Chart reviewed, RN cleared pt for participation in OT evaluation. Pt is alert and oriented x4, scores 10/30 on SLUMS assessment with deficits noted in STM, recall, executive functioning, numeric calculation and registration, visual spatial skills. Vision assessment is limited with L eye ptosis, R eye edema with pt unable to L eye fully. Pt unable to read clock from chair. Pt with dizziness when bending downward to donn shoes and underwear. Pt would benefit from AE training. Education provided re: decreasing environmental stimuli, safe ADL completion, falls prevention. Recommend HHOT with 24/7 supervision and assist for ADLs from ALF. If unable to provide level of assistance, recommend STR. OT will follow acutely.      Recommendations for follow up therapy are one component of a multi-disciplinary discharge planning process, led by the attending physician.  Recommendations may be updated based on patient status, additional functional criteria and insurance authorization.   Follow Up Recommendations  Home health OT (if pt does not have 24/7 supervision, assist with ADLs recommend STR)    Assistance Recommended at Discharge Frequent or constant Supervision/Assistance  Patient can return home with the following A little help with walking and/or transfers;A little help with  bathing/dressing/bathroom    Functional Status Assessment  Patient has had a recent decline in their functional status and demonstrates the ability to make significant improvements in function in a reasonable and predictable amount of time.  Equipment Recommendations  BSC/3in1    Recommendations for Other Services       Precautions / Restrictions Precautions Precautions: Fall Restrictions Weight Bearing Restrictions: No      Mobility Bed Mobility               General bed mobility comments: NT pt in recliner pre/post session    Transfers Overall transfer level: Needs assistance Equipment used: Rolling walker (2 wheels) Transfers: Sit to/from Stand Sit to Stand: Supervision                  Balance Overall balance assessment: Needs assistance Sitting-balance support: Feet supported Sitting balance-Leahy Scale: Fair       Standing balance-Leahy Scale: Fair                             ADL either performed or assessed with clinical judgement   ADL Overall ADL's : Needs assistance/impaired     Grooming: Wash/dry face;Sitting               Lower Body Dressing: Minimal assistance Lower Body Dressing Details (indicate cue type and reason): shoes, pt with reports of dizziness Toilet Transfer: Supervision/safety Toilet Transfer Details (indicate cue type and reason): simulated                 Vision Patient Visual Report: Blurring of vision;Eye fatigue/eye pain/headache Vision Assessment?: Yes Ocular Range of Motion: Within Functional Limits     Perception     Praxis  Pertinent Vitals/Pain Pain Assessment Pain Assessment: Faces Faces Pain Scale: Hurts a little bit Pain Location: headache Pain Descriptors / Indicators: Aching, Dull, Discomfort Pain Intervention(s): Limited activity within patient's tolerance, Monitored during session, Repositioned     Hand Dominance     Extremity/Trunk Assessment Upper Extremity  Assessment Upper Extremity Assessment: Overall WFL for tasks assessed   Lower Extremity Assessment Lower Extremity Assessment: Generalized weakness   Cervical / Trunk Assessment Cervical / Trunk Assessment: Normal   Communication Communication Communication: HOH   Cognition Arousal/Alertness: Awake/alert Behavior During Therapy: WFL for tasks assessed/performed Overall Cognitive Status: Impaired/Different from baseline Area of Impairment: Attention, Memory, Following commands, Safety/judgement, Awareness, Problem solving                   Current Attention Level: Sustained   Following Commands: Follows one step commands consistently Safety/Judgement: Decreased awareness of safety, Decreased awareness of deficits   Problem Solving: Slow processing General Comments: pt reports "I feel 45% of the way normal cognitively"     General Comments       Exercises     Shoulder Instructions      Home Living Family/patient expects to be discharged to:: Assisted living                             Home Equipment: Rollator (4 wheels);Shower seat          Prior Functioning/Environment Prior Level of Function : Needs assist             Mobility Comments: pt report amb with rolaltor ADLs Comments: staff assists with IADLs- cooking, cleaning; pt has available assit for bathing/dressing        OT Problem List: Decreased strength;Impaired balance (sitting and/or standing);Decreased activity tolerance;Decreased knowledge of use of DME or AE;Impaired vision/perception      OT Treatment/Interventions: Self-care/ADL training;Therapeutic exercise;Patient/family education;Balance training;Energy conservation;DME and/or AE instruction;Therapeutic activities;Cognitive remediation/compensation    OT Goals(Current goals can be found in the care plan section) Acute Rehab OT Goals Patient Stated Goal: feel better OT Goal Formulation: With patient Time For Goal  Achievement: 05/09/22 Potential to Achieve Goals: Good ADL Goals Pt Will Perform Lower Body Dressing: with modified independence Pt Will Transfer to Toilet: with modified independence Pt Will Perform Toileting - Clothing Manipulation and hygiene: with modified independence  OT Frequency: Min 2X/week    Co-evaluation              AM-PAC OT "6 Clicks" Daily Activity     Outcome Measure Help from another person eating meals?: None Help from another person taking care of personal grooming?: A Little Help from another person toileting, which includes using toliet, bedpan, or urinal?: A Little Help from another person bathing (including washing, rinsing, drying)?: A Little Help from another person to put on and taking off regular upper body clothing?: None Help from another person to put on and taking off regular lower body clothing?: A Little 6 Click Score: 20   End of Session Equipment Utilized During Treatment: Rolling walker (2 wheels) Nurse Communication: Mobility status  Activity Tolerance: Patient tolerated treatment well Patient left: in chair;with call bell/phone within reach;with chair alarm set  OT Visit Diagnosis: Low vision, both eyes (H54.2);Unsteadiness on feet (R26.81);Cognitive communication deficit (R41.841)                Time: 2025-4270 OT Time Calculation (min): 34 min Charges:  OT General Charges $OT Visit: 1 Visit  OT Evaluation $OT Eval Moderate Complexity: 1 Mod OT Treatments $Cognitive Funtion inital: Initial 15 mins  Oleta Mouse, OTD OTR/L  04/25/22, 1:15 PM

## 2022-04-25 NOTE — Progress Notes (Signed)
Nurse attempted to clean dried blood from patients head and hair, was able to remove some, but patient then complained of some pain so nurse stopped.

## 2022-04-25 NOTE — Progress Notes (Signed)
End of shift note:  This morning pt had increased confusion and he was getting very anxious. Pt pulled IV and MD is aware. IV fluids were stopped and IV abx were switched to PO. This afternoon discharge was cancelled because pt had a 4 second pause. STAT EKG was obtained and some meds were hold by provider.

## 2022-04-25 NOTE — Plan of Care (Signed)

## 2022-04-25 NOTE — Discharge Summary (Addendum)
Physician Discharge Summary   Patient: Johnny Rice MRN: [id number] DOB: 30-Dec-1945  Admit date:     04/23/2022  Discharge date: 04/25/2022   Discharge Physician: Lynden Oxford  PCP: [facility]  Recommendations at discharge: Follow-up with PCP in 1 week.  Discharge Diagnoses: Principal Problem:   UTI (urinary tract infection) Active Problems:   Fall at home, initial encounter   Essential hypertension   HLD (hyperlipidemia)   History of CVA (cerebrovascular accident)   BPH (benign prostatic hyperplasia)   Hypothyroid   Depression with anxiety   Scalp laceration  Assessment and Plan: Unwitnessed fall. Right scalp with hematoma and laceration Multiple etiologies responsible for the fall. At present appears to be baseline. PT recommends home health PT.  Orthostatic hypotension. History of hypertension. Patient was given IV fluids. Currently last orthostatic vitals were normal. No further work-up. Holding amlodipine. Continue Lopressor.  Enterococcus colonization versus UTI Treated with ceftriaxone.  Patient actually got better despite antibiotic not appropriate for the organism. Still given his recent fall I will continue with the treatment with Augmentin for 5 days. Discontinue Macrobid as appears to have developed resistance.  Sinus bradycardia Resolved. Currently have sinus tachycardia. On Lopressor.  History of CVA Prior history. Seen on CT scan. Continue aspirin.  Monitor.  HLD Continue statin.  Anxiety and depression Mild cognitive impairment. We will continue home regimen at discharge.  BPH. In the setting of orthostatic monitor while resuming home regimen.  Hypothyroidism. Continue Synthroid.  Consultants: none Procedures performed:  none DISCHARGE MEDICATION: Allergies as of 04/25/2022   No Known Allergies      Medication List     STOP taking these medications    amLODipine 5 MG tablet Commonly known as: NORVASC    clonazePAM 0.5 MG tablet Commonly known as: KLONOPIN   diphenhydrAMINE 25 MG tablet Commonly known as: BENADRYL   DULoxetine 30 MG capsule Commonly known as: CYMBALTA   fluticasone 50 MCG/ACT nasal spray Commonly known as: FLONASE   gabapentin 300 MG capsule Commonly known as: NEURONTIN   ibuprofen 800 MG tablet Commonly known as: ADVIL   nitrofurantoin 50 MG capsule Commonly known as: MACRODANTIN       TAKE these medications    acetaminophen 500 MG tablet Commonly known as: TYLENOL Take 500 mg by mouth every 6 (six) hours as needed for mild pain. What changed: Another medication with the same name was removed. Continue taking this medication, and follow the directions you see here.   alfuzosin 10 MG 24 hr tablet Commonly known as: UROXATRAL Take 10 mg by mouth in the morning.   alum & mag hydroxide-simeth 200-200-20 MG/5ML suspension Commonly known as: MAALOX/MYLANTA Take 30 mLs by mouth 4 (four) times daily as needed (heartburn or indigestion).   amoxicillin-clavulanate 875-125 MG tablet Commonly known as: AUGMENTIN Take 1 tablet by mouth 2 (two) times daily for 5 days.   ARIPiprazole 15 MG tablet Commonly known as: ABILIFY Take 15 mg by mouth daily.   aspirin 81 MG chewable tablet Chew 81 mg by mouth daily.   atorvastatin 20 MG tablet Commonly known as: LIPITOR Take 20 mg by mouth at bedtime.   barrier cream Crea Commonly known as: non-specified Apply 1 application topically as needed (post-toileting skin protection).   docusate sodium 100 MG capsule Commonly known as: Colace Take 1 capsule (100 mg total) by mouth daily.   ergocalciferol 1.25 MG (50000 UT) capsule Commonly known as: VITAMIN D2 Take 50,000 Units by mouth once a week.  guaifenesin 100 MG/5ML syrup Commonly known as: ROBITUSSIN Take 300 mg by mouth every 6 (six) hours as needed for cough.   hydrocortisone cream 0.5 % Apply 1 application topically 2 (two) times daily. (Apply to  left temple and under left eye)   hydrOXYzine 50 MG capsule Commonly known as: VISTARIL Take 50 mg by mouth every 8 (eight) hours as needed (agitation).   liothyronine 25 MCG tablet Commonly known as: CYTOMEL Take 25 mcg by mouth daily.   lisinopril 40 MG tablet Commonly known as: ZESTRIL Take 40 mg by mouth daily.   loperamide 2 MG capsule Commonly known as: IMODIUM Take 4 mg by mouth every 4 (four) hours as needed for diarrhea or loose stools. (Max 8 doses in 24 hours)   magnesium hydroxide 400 MG/5ML suspension Commonly known as: MILK OF MAGNESIA Take 30 mLs by mouth daily as needed for mild constipation.   meloxicam 15 MG tablet Commonly known as: MOBIC Take 15 mg by mouth daily.   metoprolol tartrate 25 MG tablet Commonly known as: LOPRESSOR Take 25 mg by mouth 2 (two) times daily.   neomycin-bacitracin-polymyxin [id number] ointment Apply 1 application topically daily as needed (wound care). (Apply to right big toe)   polyethylene glycol powder 17 GM/SCOOP powder Commonly known as: GLYCOLAX/MIRALAX Take 17 g by mouth once.   polyvinyl alcohol 1.4 % ophthalmic solution Commonly known as: LIQUIFILM TEARS Place 2 drops into both eyes daily.   traZODone 100 MG tablet Commonly known as: DESYREL Take 100 mg by mouth at bedtime.               Discharge Care Instructions  (From admission, onward)           Start     Ordered   04/25/22 0000  Discharge wound care:       Comments: Wound care to left scalp laceration now approximated with sutures: Cleanse twice daily with NS, pat dry. No dressing.  Laceration repaired with absorbable sutures.   04/25/22 1241            Follow-up Information     [facility] Schedule an appointment as soon as possible for a visit in 1 week(s).   Specialty: Geriatric Medicine Contact information: [address] [place] [address] [phone]                Disposition:  ALF/ILF Diet recommendation: Regular diet  Discharge Exam: Vitals:   04/24/22 0509 04/24/22 0839 04/24/22 1610 04/24/22 1953  BP: 123/81 140/84 (!) 138/94 129/84  Pulse: 73 85 80 72  Resp: 14 18 20 15   Temp: 98.4 F (36.9 C) 98.3 F (36.8 C) 97.7 F (36.5 C) 98.2 F (36.8 C)  TempSrc:  Oral    SpO2: 94% 93% 98% 96%  Weight:      Height:       General: Appear in mild distress; no visible Abnormal Neck Mass Or lumps, Conjunctiva normal Cardiovascular: S1 and S2 Present, aortic systolic Murmur, Respiratory: good respiratory effort, Bilateral Air entry present and CTA, no Crackles, no wheezes Abdomen: Bowel Sound present, Non tender  Extremities: no Pedal edema Neurology: alert and oriented to time, place, and person  Gait not checked due to patient safety concerns Filed Weights   04/23/22 1700  Weight: 77.1 kg   Condition at discharge: stable  The results of significant diagnostics from this hospitalization (including imaging, microbiology, ancillary and laboratory) are listed below for reference.   Imaging Studies: DG  Chest 2 View  Result Date: 04/23/2022 CLINICAL DATA:  Fall, weakness EXAM: CHEST - 2 VIEW COMPARISON:  Chest x-ray 04/02/2021 FINDINGS: Heart size and vascularity normal. Mild patchy bibasilar airspace disease right greater than left. Probable atelectasis. Decreased lung volume. No effusion. IMPRESSION: Hypoventilation with mild bibasilar atelectasis. Electronically Signed   By: Marlan Palau M.D.   On: 04/23/2022 13:22   DG Pelvis Portable  Result Date: 04/23/2022 CLINICAL DATA:  Fall, weakness EXAM: PORTABLE PELVIS 1-2 VIEWS COMPARISON:  None Available. FINDINGS: There is no evidence of pelvic fracture or diastasis. No pelvic bone lesions are seen. Disc degeneration in the lumbar spine especially L3-4 IMPRESSION: Negative for fracture Electronically Signed   By: Marlan Palau M.D.   On: 04/23/2022 13:20   DG Lumbar Spine Complete  Result Date:  04/23/2022 CLINICAL DATA:  Fall. EXAM: LUMBAR SPINE - COMPLETE 4+ VIEW COMPARISON:  None Available. FINDINGS: Normal alignment no fracture Mild levoscoliosis. Advanced disc degeneration L3-4 and moderate disc degeneration L4-5 7 mm calcification overlying left kidney compatible with renal calculus. IMPRESSION: Negative for lumbar fracture.  Multilevel degenerative change. Left renal calculus Electronically Signed   By: Marlan Palau M.D.   On: 04/23/2022 13:20   CT Cervical Spine Wo Contrast  Result Date: 04/23/2022 CLINICAL DATA:  Neck trauma (Age >= 65y) EXAM: CT CERVICAL SPINE WITHOUT CONTRAST TECHNIQUE: Multidetector CT imaging of the cervical spine was performed without intravenous contrast. Multiplanar CT image reconstructions were also generated. RADIATION DOSE REDUCTION: This exam was performed according to the departmental dose-optimization program which includes automated exposure control, adjustment of the mA and/or kV according to patient size and/or use of iterative reconstruction technique. COMPARISON:  04/17/2022 FINDINGS: Alignment: Stable. Skull base and vertebrae: Stable vertebral body heights. No acute fracture. Soft tissues and spinal canal: No prevertebral fluid or swelling. No visible canal hematoma. Disc levels: Degenerative changes are stable over the short interval. Upper chest: No new finding. Other: No new finding. IMPRESSION: No acute cervical spine fracture. Electronically Signed   By: Guadlupe Spanish M.D.   On: 04/23/2022 11:18   CT HEAD WO CONTRAST  Result Date: 04/23/2022 CLINICAL DATA:  Provided history: Head trauma, moderate/severe. Additional history provided: Unwitnessed fall, forehead laceration. EXAM: CT HEAD WITHOUT CONTRAST TECHNIQUE: Contiguous axial images were obtained from the base of the skull through the vertex without intravenous contrast. RADIATION DOSE REDUCTION: This exam was performed according to the departmental dose-optimization program which includes  automated exposure control, adjustment of the mA and/or kV according to patient size and/or use of iterative reconstruction technique. COMPARISON:  Prior head CT examinations 04/17/2022 and earlier. FINDINGS: Brain: Mild generalized cerebral and cerebellar atrophy. Large chronic right PCA territory cortical/subcortical infarct within the right temporal, occipital and parietal lobes. Associated ex vacuo dilatation of the right lateral ventricle. There is no acute intracranial hemorrhage. No acute demarcated cortical infarct. No extra-axial fluid collection. No evidence of an intracranial mass. No midline shift. Vascular: No hyperdense vessel.  Atherosclerotic calcifications. Skull: No fracture or aggressive osseous lesion. Sinuses/Orbits: No mass or acute finding within the imaged orbits. Mild mucosal thickening within the bilateral ethmoid sinuses at the imaged levels. Other: Prior left mastoidectomy. Subtle right anterior scalp/forehead hematoma. IMPRESSION: 1. No evidence of acute intracranial abnormality. 2. Small right anterior scalp/forehead hematoma. 3. Redemonstrated large chronic right PCA territory infarct. 4. Background mild generalized parenchymal atrophy. 5. Mild bilateral ethmoid sinus mucosal thickening. 6. Prior left mastoidectomy. Electronically Signed   By: Jackey Loge D.O.   On:  04/23/2022 09:03   CT Cervical Spine Wo Contrast  Result Date: 04/17/2022 CLINICAL DATA:  Neck trauma (Age >= 65y) EXAM: CT CERVICAL SPINE WITHOUT CONTRAST TECHNIQUE: Multidetector CT imaging of the cervical spine was performed without intravenous contrast. Multiplanar CT image reconstructions were also generated. RADIATION DOSE REDUCTION: This exam was performed according to the departmental dose-optimization program which includes automated exposure control, adjustment of the mA and/or kV according to patient size and/or use of iterative reconstruction technique. COMPARISON:  CT 01/16/2021 FINDINGS: Alignment:  Straightening of the normal cervical doses likely due to patient positioning. Trace degenerative anterolisthesis at C4-C5 and retrolisthesis at C5-C6. Trace degenerative anterolisthesis at C7-T1. Skull base and vertebrae: There is no evidence of acute cervical spine fracture. No aggressive osseous lesion. Soft tissues and spinal canal: No prevertebral fluid or swelling. No visible canal hematoma. Disc levels: There is moderate to severe multilevel degenerative disc disease, most severe at C5-C6 and C6-C7. There is multilevel facet arthropathy with bony fusion at C2-C3 and C4-C5. Upper chest: Negative. Other: Postsurgical changes of the left mastoid. IMPRESSION: No acute cervical spine fracture. 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 Electronically Signed   By: Caprice Renshaw M.D.   On: 04/17/2022 14:55   CT Head Wo Contrast  Result Date: 04/17/2022 CLINICAL DATA:  Head trauma, minor (Age >= 65y) EXAM: CT HEAD WITHOUT CONTRAST TECHNIQUE: Contiguous axial images were obtained from the base of the skull through the vertex without intravenous contrast. RADIATION DOSE REDUCTION: This exam was performed according to the departmental dose-optimization program which includes automated exposure control, adjustment of the mA and/or kV according to patient size and/or use of iterative reconstruction technique. COMPARISON:  11/11/2021 FINDINGS: Brain: There is no acute intracranial hemorrhage, mass effect, or edema. No new loss of gray-white differentiation. Cystic encephalomalacia with adjacent gliosis in the right occipital lobe extending into inferior parietal lobe and posterior temporal lobe. Ex vacuo dilatation of the adjacent right lateral ventricle. Superimposed prominence of the ventricles and sulci reflecting generalized parenchymal volume loss. There is no extra-axial fluid collection. Vascular: There is atherosclerotic calcification at the skull base. Skull: Calvarium is  unremarkable. Sinuses/Orbits: No acute finding. Streak artifact from metallic density overlying left orbit. Other: None. IMPRESSION: No evidence of acute intracranial injury. Stable chronic findings detailed above. Electronically Signed   By: Guadlupe Spanish M.D.   On: 04/17/2022 14:54    Microbiology: Results for orders placed or performed during the hospital encounter of 04/23/22  Urine Culture     Status: Abnormal   Collection Time: 04/23/22  9:02 AM   Specimen: Urine, Clean Catch  Result Value Ref Range Status   Specimen Description   Final    URINE, CLEAN CATCH Performed at [facility], [address], [place], [place] [address]    Special Requests   Final    NONE Performed at [facility], [address] [place], [place] [address]    Culture 30,000 COLONIES/mL ENTEROCOCCUS FAECALIS (A)  Final   Report Status 04/25/2022 FINAL  Final   Organism ID, Bacteria ENTEROCOCCUS FAECALIS (A)  Final      Susceptibility   Enterococcus faecalis - MIC*    AMPICILLIN <=2 SENSITIVE Sensitive     NITROFURANTOIN 256 RESISTANT Resistant     VANCOMYCIN 1 SENSITIVE Sensitive     * 30,000 COLONIES/mL ENTEROCOCCUS FAECALIS    Labs: CBC: Recent Labs  Lab 04/23/22 0902  WBC 7.8  HGB 13.6  HCT 41.6  MCV 87.0  PLT  254   Basic Metabolic Panel: Recent Labs  Lab 04/23/22 0902  NA 140  K 3.9  CL 110  CO2 24  GLUCOSE 94  BUN 15  CREATININE 0.89  CALCIUM 9.1   Liver Function Tests: No results for input(s): "AST", "ALT", "ALKPHOS", "BILITOT", "PROT", "ALBUMIN" in the last 168 hours. CBG: No results for input(s): "GLUCAP" in the last 168 hours.  Discharge time spent: greater than 30 minutes.  Signed: Lynden Oxford, MD [facility]

## 2022-04-25 NOTE — Progress Notes (Signed)
Patient is active with  [facility].   Discharge cancelled for today. Facility and [facility] notified.

## 2022-04-25 NOTE — Progress Notes (Signed)
Provider Notification: Dr. Ursula Alert just notified RN pt had 4 second pause. Vitals were BP 143/85 HR 79 pt denies CP or any other s/s.  The only complain he has is a headache which he has been c/o of and on during this hospitalization.   Suggestions : awaiting on orders

## 2022-04-25 NOTE — Progress Notes (Signed)
Mobility Specialist - Progress Note   04/25/22 1642  Mobility  Activity Ambulated with assistance in room;Dangled on edge of bed  Range of Motion/Exercises Active  Level of Assistance Standby assist, set-up cues, supervision of patient - no hands on  Assistive Device None;Other (Comment) (HHA)  Distance Ambulated (ft) 10 ft  Activity Response Tolerated well  $Mobility charge 1 Mobility     Pt lying in bed upon arrival, utilizing RA. Pt voiced headache pain, but agreeable to activity. Pt stood and ambulated with HHA. Visual photos placed around room to assist with scanning L-R of environment. Pt able to locate 3 items before requesting to return to bed. Pt able to scoot towards HOB before returning supine. Pt left in bed with alarm set, needs in reach.    Filiberto Pinks Mobility Specialist 04/25/22, 4:46 PM

## 2022-04-25 NOTE — Progress Notes (Signed)
[facility] PROGRESS NOTE  Patient: Johnny Rice TTS:[id number]   PCP: [facility] DOB: [birth year]   DOA: 04/23/2022   DOS: 04/25/2022    Subjective: Patient had a 4-second pause on telemetry.  At the time of my evaluation patient appears fatigued but otherwise no other complaints.  Frustrated with being discharged from the hospital.  I informed the patient that we will monitor him on the heart monitor here in the hospital.  Objective:  Vitals:   04/25/22 1354 04/25/22 1630  BP: (!) 143/85 (!) 145/88  Pulse: 68 73  Resp: 18 18  Temp: 98 F (36.7 C) 98.4 F (36.9 C)  SpO2: 98% 97%    Somewhat more confused than morning. Per RN reportedly trying to get out of the bed as well. S1-S2 present. Bowel sound present. Clear to auscultation.  Assessment and plan: AV node dysfunction. Patient had a 4-second pause.  P waves are seen.  Patient was started on Lopressor this morning which is his home medication.  Currently I will hold all medications.  Confusion. Suspect secondary to medication. Patient is on Klonopin.  Patient was seen earlier in the morning at which time he did not have any confusion but as the day progressed patient becomes more confused and fatigued. Currently reducing Klonopin dose.  Patient reports crusty eye. I do not see any evidence of inflammation. We will initiate ketotifen drops.  Due to multiple issues, will monitor patient overnight and ensure safety and stability before discharging back to ALF.  Discharge canceled on 7/7.  Author: Lynden Oxford, MD [facility] 04/25/2022 6:42 PM   If 7PM-7AM, please contact night-coverage at [email or web address]

## 2022-04-25 NOTE — Progress Notes (Signed)
Physical Therapy Treatment Patient Details Name: Johnny Rice MRN: [id number] DOB: [birth year] Today's Date: 04/25/2022   History of Present Illness Johnny Rice is a 76 y.o. 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  Patient states he does not know why he fell.  States he only has a mild headache and some discomfort in his lower back.  He denies any chest pain, abdominal pain, nausea, vomiting, fevers or other acute sick symptoms.    PT Comments    Pt received upright in chair agreeable to PT services.  Reports head ache still but improved pain from yesterday. Pt standing with supervision to RW with safe hand placement. Pt able to progress gait to 156' with RW. Pt requiring mostly supervision but intermittent minguard as pt drifts and intermittently zig zags with RW. Frequent VC's to utilize visual targets to ambulate in straight line and scanning environment for obstacle navigation with good carryover after education and cues. Pt returning to recliner with safe sequencing and hand placement. Pt deferring seated therex at this time. Pt educated on importance onf environmental scanning upon return to Assisted living to prevent tripping hazards and for obstacle navigation to prevent future falls. Pt verbalizing understanding. D/c recs remain appropriate. Still recommend 24/7 supervision with OOB mobility at discharge.    Recommendations for follow up therapy are one component of a multi-disciplinary discharge planning process, led by the attending physician.  Recommendations may be updated based on patient status, additional functional criteria and insurance authorization.  Follow Up Recommendations  Home health PT     Assistance Recommended at Discharge    Patient can return home with the  following A little help with walking and/or transfers;A little help with bathing/dressing/bathroom;Assistance with cooking/housework;Assist for transportation;Help with stairs or ramp for entrance   Equipment Recommendations  None recommended by PT    Recommendations for Other Services       Precautions / Restrictions Precautions Precautions: Fall Restrictions Weight Bearing Restrictions: No     Mobility  Bed Mobility               General bed mobility comments: NT. In recliner pre and post session Patient Response: Cooperative  Transfers Overall transfer level: Needs assistance Equipment used: Rolling walker (2 wheels) Transfers: Sit to/from Stand Sit to Stand: Supervision           General transfer comment: safe hand placement today but needs increased time to complete    Ambulation/Gait Ambulation/Gait assistance: Supervision, Min guard Gait Distance (Feet): 156 Feet Assistive device: Rolling walker (2 wheels) Gait Pattern/deviations: Step-through pattern, Drifts right/left       General Gait Details: Noted drifting R/L today. Requiring frequent VC's to redirect for obstacle navigation. Pt endorsing due to poor turning capabilites of RW as pt is used to rollator.   Stairs             Wheelchair Mobility    Modified Rankin (Stroke Patients Only)       Balance Overall balance assessment: No apparent balance deficits (not formally assessed), Needs assistance Sitting-balance support: Bilateral upper extremity supported, Feet supported Sitting balance-Leahy Scale: Fair       Standing balance-Leahy Scale: Fair  Cognition Arousal/Alertness: Awake/alert   Overall Cognitive Status: Difficult to assess                                          Exercises Other Exercises Other Exercises: Education on scanning environment and use of visual targets to ambulate in straight lines and for  obstacle navigation    General Comments        Pertinent Vitals/Pain Pain Assessment Pain Assessment: Faces Faces Pain Scale: Hurts a little bit Pain Location: Head ache Pain Descriptors / Indicators: Aching, Dull, Discomfort Pain Intervention(s): Limited activity within patient's tolerance, Monitored during session    Home Living                          Prior Function            PT Goals (current goals can now be found in the care plan section) Acute Rehab PT Goals Patient Stated Goal: improve mobility to return to assissted living PT Goal Formulation: With patient Time For Goal Achievement: 05/08/22 Potential to Achieve Goals: Good Progress towards PT goals: Progressing toward goals    Frequency    Min 2X/week      PT Plan Current plan remains appropriate    Co-evaluation              AM-PAC PT "6 Clicks" Mobility   Outcome Measure  Help needed turning from your back to your side while in a flat bed without using bedrails?: A Little Help needed moving from lying on your back to sitting on the side of a flat bed without using bedrails?: A Little Help needed moving to and from a bed to a chair (including a wheelchair)?: A Little Help needed standing up from a chair using your arms (e.g., wheelchair or bedside chair)?: A Little Help needed to walk in hospital room?: A Little Help needed climbing 3-5 steps with a railing? : A Lot 6 Click Score: 17    End of Session Equipment Utilized During Treatment: Gait belt Activity Tolerance: Patient tolerated treatment well Patient left: in chair;with nursing/sitter in room;with call bell/phone within reach Nurse Communication: Mobility status PT Visit Diagnosis: Other abnormalities of gait and mobility (R26.89);Muscle weakness (generalized) (M62.81);Dizziness and giddiness (R42)     Time: 1191-4782 PT Time Calculation (min) (ACUTE ONLY): 16 min  Charges:  $Gait Training: 8-22 mins                      Delphia Grates. Fairly IV, PT, DPT Physical Therapist- [place]  [facility]  04/25/2022, 11:18 AM

## 2022-04-26 DIAGNOSIS — N3000 Acute cystitis without hematuria: Secondary | ICD-10-CM | POA: Diagnosis not present

## 2022-04-26 LAB — BASIC METABOLIC PANEL
Anion gap: 6 (ref 5–15)
BUN: 13 mg/dL (ref 8–23)
CO2: 26 mmol/L (ref 22–32)
Calcium: 9.1 mg/dL (ref 8.9–10.3)
Chloride: 107 mmol/L (ref 98–111)
Creatinine, Ser: 0.87 mg/dL (ref 0.61–1.24)
GFR, Estimated: >60 mL/min (ref >60)
Glucose, Bld: 99 mg/dL (ref 70–99)
Potassium: 3.9 mmol/L (ref 3.5–5.1)
Sodium: 139 mmol/L (ref 135–145)

## 2022-04-26 LAB — CBC
HCT: 35.1 % — ABNORMAL LOW (ref 39.0–52.0)
Hemoglobin: 11.9 g/dL — ABNORMAL LOW (ref 13.0–17.0)
MCH: 28.8 pg (ref 26.0–34.0)
MCHC: 33.9 g/dL (ref 30.0–36.0)
MCV: 85 fL (ref 80.0–100.0)
Platelets: 252 10*3/uL (ref 150–400)
RBC: 4.13 MIL/uL — ABNORMAL LOW (ref 4.22–5.81)
RDW: 13.6 % (ref 11.5–15.5)
WBC: 7.9 10*3/uL (ref 4.0–10.5)
nRBC: 0 % (ref 0.0–0.2)

## 2022-04-26 LAB — MAGNESIUM: Magnesium: 1.7 mg/dL (ref 1.7–2.4)

## 2022-04-26 MED ORDER — ARIPIPRAZOLE 10 MG PO TABS: 10.0000 mg | ORAL_TABLET | Freq: Every day | ORAL | 0 refills | Status: DC

## 2022-04-26 MED ORDER — CLONAZEPAM 0.5 MG PO TABS: 0.2500 mg | ORAL_TABLET | Freq: Three times a day (TID) | ORAL | 0 refills | Status: DC | PRN

## 2022-04-26 MED ORDER — ARIPIPRAZOLE 10 MG PO TABS
10.0000 mg | ORAL_TABLET | Freq: Every day | ORAL | Status: DC
  Administered 2022-04-26: 10 mg via ORAL
  Filled 2022-04-26: qty 1

## 2022-04-26 NOTE — Plan of Care (Signed)

## 2022-04-26 NOTE — Progress Notes (Signed)
Discharge instructions and med details reviewed with patient's daughter Hollie Salk. Printed prescription for clonazepam and AVS placed in DC envelope for facility. Patient escorted out via wheelchair and assisted into vehicle.

## 2022-04-26 NOTE — TOC Transition Note (Signed)
Transition of Care [facility]) - CM/SW Discharge Note   Patient Details  Name: Johnny Rice MRN: [id number] Date of Birth: 1946/02/29  [facility]) CM/SW Contact:  Bing Quarry, RN Phone Number: 04/26/2022, 1:36 PM   Clinical Narrative: 7/8: Patient has discharge orders back to ALF [facility]. DC Summary faxed and inboxed via Epic. Facility notified of return. Daughter will come from [place] to transport to ALF on discharge. Discussed 24/7 supervision needs with facility contact and daughter who will also discuss, along with ongoing PT/OT with facility. Active with [facility] per prior CM notes. Gabriel Cirri RN CM       Final next level of care: Assisted Living ([facility] [address]) Barriers to Discharge: Barriers Resolved   Patient Goals and CMS Choice        Discharge Placement                Patient to be transferred to facility by: Daugther will transport to [facility]. (Daugther will transport to [facility] per phone call to her.) Name of family member notified: Vignesh, Willert (Daughter)   [phone] (Mobile) Patient and family notified of of transfer: 04/26/22  Discharge Plan and Services                DME Arranged: N/A DME Agency: NA       HH Arranged: OT, PT HH Agency: [facility]        Social Determinants of Health (SDOH) Interventions     Readmission Risk Interventions     No data to display

## 2022-04-26 NOTE — Discharge Summary (Addendum)
Physician Discharge Summary   Patient: Johnny Rice MRN: [id number] DOB: August 18, 1946  Admit date:     04/23/2022  Discharge date: 04/26/2022   Discharge Physician: Lynden Oxford  PCP: [facility]  Recommendations at discharge: Follow-up with PCP in 1 week.  Discharge Diagnoses: Principal Problem:   UTI (urinary tract infection) Active Problems:   Fall at home, initial encounter   Essential hypertension   HLD (hyperlipidemia)   History of CVA (cerebrovascular accident)   BPH (benign prostatic hyperplasia)   Hypothyroid   Depression with anxiety   Scalp laceration  Assessment and Plan: Unwitnessed fall. Right scalp with hematoma and laceration Headache Multiple etiologies responsible for the fall. At present appears to be baseline. PT recommends home health PT. No focal deficits, stitched with absorbable sutures.   Orthostatic hypotension. History of hypertension. Patient was given IV fluids. Currently last orthostatic vitals were normal. No further work-up. Holding amlodipine and Lopressor Continue ACE-I.  Enterococcus colonization versus UTI Treated with ceftriaxone.  Patient actually got better despite antibiotic not appropriate for the organism. Still given his recent fall I will continue with the treatment with Augmentin for 5 days. Discontinue Macrobid as appears to have developed resistance.  Sinus bradycardia AV block Resolved. 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 Seen on CT scan. Continue aspirin.  Monitor.  HLD Continue statin.  Anxiety and depression Mild cognitive impairment. Mentation is better after reducing the dose of clonazepam   BPH. In the setting of orthostatic monitor while resuming home regimen.  Hypothyroidism. Continue Synthroid.  Consultants: none Procedures performed:  none DISCHARGE MEDICATION: Allergies as of  04/26/2022   No Known Allergies      Medication List     STOP taking these medications    amLODipine 5 MG tablet Commonly known as: NORVASC   diphenhydrAMINE 25 MG tablet Commonly known as: BENADRYL   DULoxetine 30 MG capsule Commonly known as: CYMBALTA   fluticasone 50 MCG/ACT nasal spray Commonly known as: FLONASE   gabapentin 300 MG capsule Commonly known as: NEURONTIN   ibuprofen 800 MG tablet Commonly known as: ADVIL   nitrofurantoin 50 MG capsule Commonly known as: MACRODANTIN       TAKE these medications    acetaminophen 500 MG tablet Commonly known as: TYLENOL Take 500 mg by mouth every 6 (six) hours as needed for mild pain. What changed: Another medication with the same name was removed. Continue taking this medication, and follow the directions you see here.   alfuzosin 10 MG 24 hr tablet Commonly known as: UROXATRAL Take 10 mg by mouth in the morning.   alum & mag hydroxide-simeth 200-200-20 MG/5ML suspension Commonly known as: MAALOX/MYLANTA Take 30 mLs by mouth 4 (four) times daily as needed (heartburn or indigestion).   amoxicillin-clavulanate 875-125 MG tablet Commonly known as: AUGMENTIN Take 1 tablet by mouth 2 (two) times daily for 5 days.   ARIPiprazole 10 MG tablet Commonly known as: ABILIFY Take 1 tablet (10 mg total) by mouth daily. Start taking on: April 27, 2022 What changed:  medication strength how much to take   aspirin 81 MG chewable tablet Chew 81 mg by mouth daily.   atorvastatin 20 MG tablet Commonly known as: LIPITOR Take 20 mg by mouth at bedtime.   barrier cream Crea Commonly known as: non-specified Apply 1 application topically as needed (post-toileting skin protection).   clonazePAM 0.5  MG tablet Commonly known as: KLONOPIN Take 0.5 tablets (0.25 mg total) by mouth 3 (three) times daily as needed for anxiety. What changed:  how much to take when to take this reasons to take this   docusate sodium 100 MG  capsule Commonly known as: Colace Take 1 capsule (100 mg total) by mouth daily.   ergocalciferol 1.25 MG (50000 UT) capsule Commonly known as: VITAMIN D2 Take 50,000 Units by mouth once a week.   guaifenesin 100 MG/5ML syrup Commonly known as: ROBITUSSIN Take 300 mg by mouth every 6 (six) hours as needed for cough.   hydrocortisone cream 0.5 % Apply 1 application topically 2 (two) times daily. (Apply to left temple and under left eye)   hydrOXYzine 50 MG capsule Commonly known as: VISTARIL Take 50 mg by mouth every 8 (eight) hours as needed (agitation).   ketorolac 0.5 % ophthalmic solution Commonly known as: ACULAR Place 1 drop into both eyes 4 (four) times daily for 7 days.   liothyronine 25 MCG tablet Commonly known as: CYTOMEL Take 25 mcg by mouth daily.   lisinopril 40 MG tablet Commonly known as: ZESTRIL Take 40 mg by mouth daily.   loperamide 2 MG capsule Commonly known as: IMODIUM Take 4 mg by mouth every 4 (four) hours as needed for diarrhea or loose stools. (Max 8 doses in 24 hours)   magnesium hydroxide 400 MG/5ML suspension Commonly known as: MILK OF MAGNESIA Take 30 mLs by mouth daily as needed for mild constipation.   meloxicam 15 MG tablet Commonly known as: MOBIC Take 15 mg by mouth daily.   metoprolol tartrate 25 MG tablet Commonly known as: LOPRESSOR Take 25 mg by mouth 2 (two) times daily.   neomycin-bacitracin-polymyxin [phone] ointment Apply 1 application topically daily as needed (wound care). (Apply to right big toe)   polyethylene glycol powder 17 GM/SCOOP powder Commonly known as: GLYCOLAX/MIRALAX Take 17 g by mouth once.   polyvinyl alcohol 1.4 % ophthalmic solution Commonly known as: LIQUIFILM TEARS Place 2 drops into both eyes daily.   traZODone 100 MG tablet Commonly known as: DESYREL Take 100 mg by mouth at bedtime.               Discharge Care Instructions  (From admission, onward)           Start      Ordered   04/25/22 0000  Discharge wound care:       Comments: Wound care to left scalp laceration now approximated with sutures: Cleanse twice daily with NS, pat dry. No dressing.  Laceration repaired with absorbable sutures.   04/25/22 1241            Follow-up Information     [facility] Schedule an appointment as soon as possible for a visit in 1 week(s).   Specialty: Geriatric Medicine Contact information: [address] [place] [id number] [phone]                Disposition: ALF/ILF Diet recommendation: Regular diet  Discharge Exam: Vitals:   04/25/22 1630 04/25/22 2302 04/26/22 0449 04/26/22 0800  BP: (!) 145/88 132/75 (!) 139/96 (!) 144/81  Pulse: 73 (!) 57 73 82  Resp: 18 20 18 18   Temp: 98.4 F (36.9 C) 98.3 F (36.8 C) 97.7 F (36.5 C) 98.2 F (36.8 C)  TempSrc: Oral Oral    SpO2: 97% 97% 98% 100%  Weight:      Height:  General: Appear in mild distress; no visible Abnormal Neck Mass Or lumps, Conjunctiva normal Cardiovascular: S1 and S2 Present, aortic systolic Murmur, Respiratory: good respiratory effort, Bilateral Air entry present and CTA, no Crackles, no wheezes Abdomen: Bowel Sound present, Non tender  Extremities: no Pedal edema Neurology: alert and oriented to time, place, and person. Residual left lower extremity weakness left-sided facial droop and abnormal speech.  Gait not checked due to patient safety concerns Filed Weights   04/23/22 1700  Weight: 77.1 kg   Condition at discharge: stable  The results of significant diagnostics from this hospitalization (including imaging, microbiology, ancillary and laboratory) are listed below for reference.   Imaging Studies: DG Chest 2 View  Result Date: 04/23/2022 CLINICAL DATA:  Fall, weakness EXAM: CHEST - 2 VIEW COMPARISON:  Chest x-ray 04/02/2021 FINDINGS: Heart size and vascularity normal. Mild patchy bibasilar airspace disease right greater  than left. Probable atelectasis. Decreased lung volume. No effusion. IMPRESSION: Hypoventilation with mild bibasilar atelectasis. Electronically Signed   By: Marlan Palau M.D.   On: 04/23/2022 13:22   DG Pelvis Portable  Result Date: 04/23/2022 CLINICAL DATA:  Fall, weakness EXAM: PORTABLE PELVIS 1-2 VIEWS COMPARISON:  None Available. FINDINGS: There is no evidence of pelvic fracture or diastasis. No pelvic bone lesions are seen. Disc degeneration in the lumbar spine especially L3-4 IMPRESSION: Negative for fracture Electronically Signed   By: Marlan Palau M.D.   On: 04/23/2022 13:20   DG Lumbar Spine Complete  Result Date: 04/23/2022 CLINICAL DATA:  Fall. EXAM: LUMBAR SPINE - COMPLETE 4+ VIEW COMPARISON:  None Available. FINDINGS: Normal alignment no fracture Mild levoscoliosis. Advanced disc degeneration L3-4 and moderate disc degeneration L4-5 7 mm calcification overlying left kidney compatible with renal calculus. IMPRESSION: Negative for lumbar fracture.  Multilevel degenerative change. Left renal calculus Electronically Signed   By: Marlan Palau M.D.   On: 04/23/2022 13:20   CT Cervical Spine Wo Contrast  Result Date: 04/23/2022 CLINICAL DATA:  Neck trauma (Age >= 65y) EXAM: CT CERVICAL SPINE WITHOUT CONTRAST TECHNIQUE: Multidetector CT imaging of the cervical spine was performed without intravenous contrast. Multiplanar CT image reconstructions were also generated. RADIATION DOSE REDUCTION: This exam was performed according to the departmental dose-optimization program which includes automated exposure control, adjustment of the mA and/or kV according to patient size and/or use of iterative reconstruction technique. COMPARISON:  04/17/2022 FINDINGS: Alignment: Stable. Skull base and vertebrae: Stable vertebral body heights. No acute fracture. Soft tissues and spinal canal: No prevertebral fluid or swelling. No visible canal hematoma. Disc levels: Degenerative changes are stable over the short  interval. Upper chest: No new finding. Other: No new finding. IMPRESSION: No acute cervical spine fracture. Electronically Signed   By: Guadlupe Spanish M.D.   On: 04/23/2022 11:18   CT HEAD WO CONTRAST  Result Date: 04/23/2022 CLINICAL DATA:  Provided history: Head trauma, moderate/severe. Additional history provided: Unwitnessed fall, forehead laceration. EXAM: CT HEAD WITHOUT CONTRAST TECHNIQUE: Contiguous axial images were obtained from the base of the skull through the vertex without intravenous contrast. RADIATION DOSE REDUCTION: This exam was performed according to the departmental dose-optimization program which includes automated exposure control, adjustment of the mA and/or kV according to patient size and/or use of iterative reconstruction technique. COMPARISON:  Prior head CT examinations 04/17/2022 and earlier. FINDINGS: Brain: Mild generalized cerebral and cerebellar atrophy. Large chronic right PCA territory cortical/subcortical infarct within the right temporal, occipital and parietal lobes. Associated ex vacuo dilatation of the right lateral ventricle. There is no  acute intracranial hemorrhage. No acute demarcated cortical infarct. No extra-axial fluid collection. No evidence of an intracranial mass. No midline shift. Vascular: No hyperdense vessel.  Atherosclerotic calcifications. Skull: No fracture or aggressive osseous lesion. Sinuses/Orbits: No mass or acute finding within the imaged orbits. Mild mucosal thickening within the bilateral ethmoid sinuses at the imaged levels. Other: Prior left mastoidectomy. Subtle right anterior scalp/forehead hematoma. IMPRESSION: 1. No evidence of acute intracranial abnormality. 2. Small right anterior scalp/forehead hematoma. 3. Redemonstrated large chronic right PCA territory infarct. 4. Background mild generalized parenchymal atrophy. 5. Mild bilateral ethmoid sinus mucosal thickening. 6. Prior left mastoidectomy. Electronically Signed   By: Jackey Loge  D.O.   On: 04/23/2022 09:03   CT Cervical Spine Wo Contrast  Result Date: 04/17/2022 CLINICAL DATA:  Neck trauma (Age >= 65y) EXAM: CT CERVICAL SPINE WITHOUT CONTRAST TECHNIQUE: Multidetector CT imaging of the cervical spine was performed without intravenous contrast. Multiplanar CT image reconstructions were also generated. RADIATION DOSE REDUCTION: This exam was performed according to the departmental dose-optimization program which includes automated exposure control, adjustment of the mA and/or kV according to patient size and/or use of iterative reconstruction technique. COMPARISON:  CT 01/16/2021 FINDINGS: Alignment: Straightening of the normal cervical doses likely due to patient positioning. Trace degenerative anterolisthesis at C4-C5 and retrolisthesis at C5-C6. Trace degenerative anterolisthesis at C7-T1. Skull base and vertebrae: There is no evidence of acute cervical spine fracture. No aggressive osseous lesion. Soft tissues and spinal canal: No prevertebral fluid or swelling. No visible canal hematoma. Disc levels: There is moderate to severe multilevel degenerative disc disease, most severe at C5-C6 and C6-C7. There is multilevel facet arthropathy with bony fusion at C2-C3 and C4-C5. Upper chest: Negative. Other: Postsurgical changes of the left mastoid. IMPRESSION: No acute cervical spine fracture. 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 Electronically Signed   By: Caprice Renshaw M.D.   On: 04/17/2022 14:55   CT Head Wo Contrast  Result Date: 04/17/2022 CLINICAL DATA:  Head trauma, minor (Age >= 65y) EXAM: CT HEAD WITHOUT CONTRAST TECHNIQUE: Contiguous axial images were obtained from the base of the skull through the vertex without intravenous contrast. RADIATION DOSE REDUCTION: This exam was performed according to the departmental dose-optimization program which includes automated exposure control, adjustment of the mA and/or kV according to patient size  and/or use of iterative reconstruction technique. COMPARISON:  11/11/2021 FINDINGS: Brain: There is no acute intracranial hemorrhage, mass effect, or edema. No new loss of gray-white differentiation. Cystic encephalomalacia with adjacent gliosis in the right occipital lobe extending into inferior parietal lobe and posterior temporal lobe. Ex vacuo dilatation of the adjacent right lateral ventricle. Superimposed prominence of the ventricles and sulci reflecting generalized parenchymal volume loss. There is no extra-axial fluid collection. Vascular: There is atherosclerotic calcification at the skull base. Skull: Calvarium is unremarkable. Sinuses/Orbits: No acute finding. Streak artifact from metallic density overlying left orbit. Other: None. IMPRESSION: No evidence of acute intracranial injury. Stable chronic findings detailed above. Electronically Signed   By: Guadlupe Spanish M.D.   On: 04/17/2022 14:54    Microbiology: Results for orders placed or performed during the hospital encounter of 04/23/22  Urine Culture     Status: Abnormal   Collection Time: 04/23/22  9:02 AM   Specimen: Urine, Clean Catch  Result Value Ref Range Status   Specimen Description   Final    URINE, CLEAN CATCH Performed at [facility], [address] [place], [place] [address]    Special Requests  Final    NONE Performed at [facility], [address] [place], [place] [address]    Culture 30,000 COLONIES/mL ENTEROCOCCUS FAECALIS (A)  Final   Report Status 04/25/2022 FINAL  Final   Organism ID, Bacteria ENTEROCOCCUS FAECALIS (A)  Final      Susceptibility   Enterococcus faecalis - MIC*    AMPICILLIN <=2 SENSITIVE Sensitive     NITROFURANTOIN 256 RESISTANT Resistant     VANCOMYCIN 1 SENSITIVE Sensitive     * 30,000 COLONIES/mL ENTEROCOCCUS FAECALIS    Labs: CBC: Recent Labs  Lab 04/23/22 0902 04/26/22 0511  WBC 7.8 7.9  HGB 13.6 11.9*  HCT 41.6 35.1*  MCV 87.0 85.0  PLT 254 252    Basic Metabolic Panel: Recent Labs  Lab 04/23/22 0902 04/26/22 0511  NA 140 139  K 3.9 3.9  CL 110 107  CO2 24 26  GLUCOSE 94 99  BUN 15 13  CREATININE 0.89 0.87  CALCIUM 9.1 9.1  MG  --  1.7   Liver Function Tests: No results for input(s): "AST", "ALT", "ALKPHOS", "BILITOT", "PROT", "ALBUMIN" in the last 168 hours. CBG: No results for input(s): "GLUCAP" in the last 168 hours.  Discharge time spent: greater than 30 minutes.  Signed: Lynden Oxford, MD [facility]

## 2022-04-27 ENCOUNTER — Emergency Department
Admission: EM | Admit: 2022-04-27 | Discharge: 2022-04-27 | Disposition: A | Discharge status: Skilled Nursing Facility | Payer: Medicare Other | Attending: Emergency Medicine | Admitting: Emergency Medicine

## 2022-04-27 ENCOUNTER — Other Ambulatory Visit: Payer: Self-pay

## 2022-04-27 ENCOUNTER — Encounter: Payer: Self-pay | Admitting: Emergency Medicine

## 2022-04-27 DIAGNOSIS — W19XXXA Unspecified fall, initial encounter: Secondary | ICD-10-CM | POA: Diagnosis not present

## 2022-04-27 DIAGNOSIS — S0502XA Injury of conjunctiva and corneal abrasion without foreign body, left eye, initial encounter: Secondary | ICD-10-CM | POA: Insufficient documentation

## 2022-04-27 DIAGNOSIS — S058X2A Other injuries of left eye and orbit, initial encounter: Secondary | ICD-10-CM | POA: Diagnosis present

## 2022-04-27 MED ORDER — SODIUM CHLORIDE 0.9 % IV BOLUS
1000.0000 mL | Freq: Once | INTRAVENOUS | Status: AC
Start: 2022-04-27 — End: 2022-04-27
  Administered 2022-04-27: 1000 mL via INTRAVENOUS

## 2022-04-27 MED ORDER — ACETAMINOPHEN 325 MG PO TABS
650.0000 mg | ORAL_TABLET | Freq: Once | ORAL | Status: AC
  Administered 2022-04-27: 650 mg via ORAL
  Filled 2022-04-27: qty 2

## 2022-04-27 MED ORDER — FLUORESCEIN SODIUM 1 MG OP STRP
1.0000 | ORAL_STRIP | Freq: Once | OPHTHALMIC | Status: AC
  Administered 2022-04-27: 1 via OPHTHALMIC
  Filled 2022-04-27: qty 1

## 2022-04-27 MED ORDER — POLYMYXIN B-TRIMETHOPRIM 10000-0.1 UNIT/ML-% OP SOLN
1.0000 [drp] | OPHTHALMIC | Status: DC
Start: 2022-04-27 — End: 2022-04-27
  Administered 2022-04-27: 1 [drp] via OPHTHALMIC
  Filled 2022-04-27: qty 10

## 2022-04-27 MED ORDER — TETRACAINE HCL 0.5 % OP SOLN
1.0000 [drp] | Freq: Once | OPHTHALMIC | Status: AC
  Administered 2022-04-27: 1 [drp] via OPHTHALMIC
  Filled 2022-04-27: qty 4

## 2022-04-27 NOTE — ED Triage Notes (Signed)
Pt arrives via EMS from [facility] with left eye pain. Pt recently seen for a fall and diagnosed with UTI, discharged from here last night. Pt has facial droop, hx of CVA.

## 2022-04-27 NOTE — Discharge Instructions (Addendum)
Please use the given eyedrops: 1 drop every 4 hours in your left eye until symptoms resolve

## 2022-04-27 NOTE — ED Notes (Signed)
Pending EMS transport 

## 2022-04-27 NOTE — ED Provider Notes (Signed)
[facility] Provider Note   Event Date/Time   First MD Initiated Contact with Patient 04/27/22 1156     (approximate) History  Eye Problem  HPI Burke Terry is a 76 y.o. male  Location: Left eye Duration: 1 day prior to arrival Timing: Worsening since onset Severity: 7/10 Quality: Aching pain Context: Patient states he began feeling an aching to the lateral aspect of his left eye 24 hours prior to arrival.  Patient states that he has a left facial droop and difficulty opening closing this eye after a previous stroke Modifying factors: Denies Associated Symptoms: Brown discharge ROS: Patient currently denies any vision changes, tinnitus, difficulty speaking, facial droop, sore throat, chest pain, shortness of breath, abdominal pain, nausea/vomiting/diarrhea, dysuria, or weakness/numbness/paresthesias in any extremity   Physical Exam  Triage Vital Signs: ED Triage Vitals  Enc Vitals Group     BP 04/27/22 1155 (!) 83/65     Pulse Rate 04/27/22 1155 82     Resp 04/27/22 1155 10     Temp 04/27/22 1155 98.1 F (36.7 C)     Temp src --      SpO2 04/27/22 1155 95 %     Weight 04/27/22 1157 169 lb 12.1 oz (77 kg)     Height 04/27/22 1157 5\' 6"  (1.676 m)     Head Circumference --      Peak Flow --      Pain Score 04/27/22 1156 7     Pain Loc --      Pain Edu? --      Excl. in GC? --    Most recent vital signs: Vitals:   04/27/22 1230 04/27/22 1245  BP: 90/67 96/75  Pulse: 66 64  Resp: 11 10  Temp:    SpO2: 93% 95%   General: Awake, oriented x4. CV:  Good peripheral perfusion.  Resp:  Normal effort.  Abd:  No distention.  Other:  Elderly Caucasian male sitting in bed in no distress.  Fluorescein exam of the left eye shows a small 5 mm horizontal linear corneal abrasion to the lateral aspect ED Results / Procedures / Treatments   PROCEDURES: Critical Care performed: No .1-3 Lead EKG Interpretation  Performed by: 09-28-1992, MD Authorized  by: Merwyn Katos, MD     Interpretation: normal     ECG rate:  65   ECG rate assessment: normal     Rhythm: sinus rhythm     Ectopy: none     Conduction: normal    MEDICATIONS ORDERED IN ED: Medications  trimethoprim-polymyxin b (POLYTRIM) ophthalmic solution 1 drop (has no administration in time range)  sodium chloride 0.9 % bolus 1,000 mL (1,000 mLs Intravenous New Bag/Given 04/27/22 1220)  fluorescein ophthalmic strip 1 strip (1 strip Left Eye Given 04/27/22 1221)  tetracaine (PONTOCAINE) 0.5 % ophthalmic solution 1 drop (1 drop Left Eye Given 04/27/22 1221)   IMPRESSION / MDM / ASSESSMENT AND PLAN / ED COURSE  I reviewed the triage vital signs and the nursing notes.                             The patient is on the cardiac monitor to evaluate for evidence of arrhythmia and/or significant heart rate changes. Patient's presentation is most consistent with acute presentation with potential threat to life or bodily function. 76 year old male with history and exam consistent with corneal abrasion of the left eye.  Initial  considerations in this patient included corneal abrasion, intraocular and corneal foreign bodies, corneal ulceration, various etiologies of iritis, and various etiologies of conjunctivitis amongst others.   Patient presented with eye pain and redness with associated foreign body sensation suggestive of corneal abrasion.  Patient noted to have corneal abrasion on fluorescein examination with wood's lamp of the eye.  No evidence of foreign bodies with eversion of the eyelid.  Patient denies contact lens use, and has no other findings suggestive of corneal ulceration at this time.  No evidence of a positive Seidel test or other findings suggestive of globe perforation on evaluation in the ED.  No evidence of corneal foreign body on exam.   Significant improvement in pain and visual acuity noted with application of topical anesthetic to the eye.  Prior to discharge, we discussed  return precautions, treatment with lubricating eye drops and NSAIDs, and follow up with primary care doctor within 1 week as needed for further evaluation, and the patient demonstrated understanding and agreement.   FINAL CLINICAL IMPRESSION(S) / ED DIAGNOSES   Final diagnoses:  Abrasion of left cornea, initial encounter   Rx / DC Orders   ED Discharge Orders     None      Note:  This document was prepared using Dragon voice recognition software and may include unintentional dictation errors.   Merwyn Katos, MD 04/27/22 [phone]

## 2022-05-07 ENCOUNTER — Ambulatory Visit: Payer: Medicare Other | Admitting: Urology

## 2022-05-08 ENCOUNTER — Encounter: Payer: Self-pay | Admitting: Urology

## 2022-05-19 ENCOUNTER — Emergency Department
Admission: EM | Admit: 2022-05-19 | Discharge: 2022-05-19 | Disposition: A | Discharge status: Skilled Nursing Facility | Payer: Medicare Other | Attending: Emergency Medicine | Admitting: Emergency Medicine

## 2022-05-19 ENCOUNTER — Other Ambulatory Visit: Payer: Self-pay

## 2022-05-19 DIAGNOSIS — R5383 Other fatigue: Secondary | ICD-10-CM | POA: Insufficient documentation

## 2022-05-19 DIAGNOSIS — H5789 Other specified disorders of eye and adnexa: Secondary | ICD-10-CM | POA: Insufficient documentation

## 2022-05-19 DIAGNOSIS — I10 Essential (primary) hypertension: Secondary | ICD-10-CM | POA: Insufficient documentation

## 2022-05-19 DIAGNOSIS — H18891 Other specified disorders of cornea, right eye: Secondary | ICD-10-CM

## 2022-05-19 DIAGNOSIS — H5711 Ocular pain, right eye: Secondary | ICD-10-CM | POA: Diagnosis present

## 2022-05-19 LAB — CBC WITH DIFFERENTIAL/PLATELET
Abs Immature Granulocytes: 0.02 10*3/uL (ref 0.00–0.07)
Basophils Absolute: 0.1 10*3/uL (ref 0.0–0.1)
Basophils Relative: 1 %
Eosinophils Absolute: 0.2 10*3/uL (ref 0.0–0.5)
Eosinophils Relative: 3 %
HCT: 39.9 % (ref 39.0–52.0)
Hemoglobin: 13.2 g/dL (ref 13.0–17.0)
Immature Granulocytes: 0 %
Lymphocytes Relative: 29 %
Lymphs Abs: 2 10*3/uL (ref 0.7–4.0)
MCH: 28.5 pg (ref 26.0–34.0)
MCHC: 33.1 g/dL (ref 30.0–36.0)
MCV: 86.2 fL (ref 80.0–100.0)
Monocytes Absolute: 0.7 10*3/uL (ref 0.1–1.0)
Monocytes Relative: 10 %
Neutro Abs: 3.8 10*3/uL (ref 1.7–7.7)
Neutrophils Relative %: 57 %
Platelets: 278 10*3/uL (ref 150–400)
RBC: 4.63 MIL/uL (ref 4.22–5.81)
RDW: 13.6 % (ref 11.5–15.5)
WBC: 6.7 10*3/uL (ref 4.0–10.5)
nRBC: 0 % (ref 0.0–0.2)

## 2022-05-19 LAB — COMPREHENSIVE METABOLIC PANEL
ALT: 20 U/L (ref 0–44)
AST: 25 U/L (ref 15–41)
Albumin: 4.3 g/dL (ref 3.5–5.0)
Alkaline Phosphatase: 152 U/L — ABNORMAL HIGH (ref 38–126)
Anion gap: 11 (ref 5–15)
BUN: 16 mg/dL (ref 8–23)
CO2: 23 mmol/L (ref 22–32)
Calcium: 9.5 mg/dL (ref 8.9–10.3)
Chloride: 110 mmol/L (ref 98–111)
Creatinine, Ser: 1.12 mg/dL (ref 0.61–1.24)
GFR, Estimated: >60 mL/min (ref >60)
Glucose, Bld: 105 mg/dL — ABNORMAL HIGH (ref 70–99)
Potassium: 3.9 mmol/L (ref 3.5–5.1)
Sodium: 144 mmol/L (ref 135–145)
Total Bilirubin: 0.7 mg/dL (ref 0.3–1.2)
Total Protein: 7.6 g/dL (ref 6.5–8.1)

## 2022-05-19 LAB — LACTIC ACID, PLASMA: Lactic Acid, Venous: 1.4 mmol/L (ref 0.5–1.9)

## 2022-05-19 MED ORDER — FLUORESCEIN SODIUM 1 MG OP STRP
1.0000 | ORAL_STRIP | Freq: Once | OPHTHALMIC | Status: AC
  Administered 2022-05-19: 1 via OPHTHALMIC
  Filled 2022-05-19: qty 1

## 2022-05-19 MED ORDER — TETRACAINE HCL 0.5 % OP SOLN
2.0000 [drp] | Freq: Once | OPHTHALMIC | Status: AC
  Administered 2022-05-19: 2 [drp] via OPHTHALMIC
  Filled 2022-05-19: qty 4

## 2022-05-19 MED ORDER — HYPROMELLOSE (GONIOSCOPIC) 2.5 % OP SOLN: 1.0000 [drp] | Freq: Three times a day (TID) | OPHTHALMIC | 12 refills | Status: AC | PRN

## 2022-05-19 NOTE — ED Triage Notes (Signed)
From the [facility] [place] by ems.  C/o fatigue.  Is on augmentin for abscess inmouth and also had recent uti and finished nitrofuranoin.  [id number]  126/82 96%%RA  18g IV left hand and had 400 ml NS bolus.  12 lead unremarkable.  Patient with diff hearing and seeing.

## 2022-05-19 NOTE — ED Provider Notes (Signed)
[facility] Provider Note    Event Date/Time   First MD Initiated Contact with Patient 05/19/22 1805     (approximate)   History   Chief Complaint Fatigue and Back Pain   HPI  Semaje Kinker is a 76 y.o. male with past medical history of hypertension, hyperlipidemia, stroke with left facial droop, cognitive impairment, somatic symptom disorder, and benzodiazepine abuse who presents to the ED for fatigue and eye pain.  Patient reports that he has been dealing with discomfort in his right eye for about the past week.  He describes his eye as feeling like "sandpaper" but has not noticed any redness or drainage to the eye.  He denies any changes in his vision and has not noticed any swelling around the eye.  He does state that he has been feeling more fatigued than usual along with some pain in his lower back he has not had any cough, chest pain, shortness of breath, nausea, vomiting, diarrhea, or abdominal pain.  He denies any numbness or weakness in his extremities, has not had any trauma to his back.     Physical Exam   Triage Vital Signs: ED Triage Vitals  Enc Vitals Group     BP 05/19/22 1501 (!) 138/94     Pulse Rate 05/19/22 1501 79     Resp 05/19/22 1501 16     Temp 05/19/22 1501 98.5 F (36.9 C)     Temp Source 05/19/22 1501 Oral     SpO2 05/19/22 1501 97 %     Weight 05/19/22 1503 169 lb (76.7 kg)     Height 05/19/22 1503 5\' 6"  (1.676 m)     Head Circumference --      Peak Flow --      Pain Score 05/19/22 1502 7     Pain Loc --      Pain Edu? --      Excl. in GC? --     Most recent vital signs: Vitals:   05/19/22 1757 05/19/22 1917  BP: (!) 146/114 (!) 145/100  Pulse: 85 82  Resp: 18 17  Temp:    SpO2: 93% 95%    Constitutional: Alert and oriented. Eyes: Conjunctivae are normal.  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  Right pupil round and reactive to light. Head: Atraumatic. Nose: No  congestion/rhinnorhea. Mouth/Throat: Mucous membranes are moist.  Cardiovascular: Normal rate, regular rhythm. Grossly normal heart sounds.  2+ radial pulses bilaterally. Respiratory: Normal respiratory effort.  No retractions. Lungs CTAB. Gastrointestinal: Soft and nontender. No distention. Musculoskeletal: No lower extremity tenderness nor edema.  No midline thoracic or lumbar spinal tenderness to palpation. Neurologic:  Normal speech and language.  Left facial droop noted, otherwise no focal neurologic deficits.    ED Results / Procedures / Treatments   Labs (all labs ordered are listed, but only abnormal results are displayed) Labs Reviewed  COMPREHENSIVE METABOLIC PANEL - Abnormal; Notable for the following components:      Result Value   Glucose, Bld 105 (*)    Alkaline Phosphatase 152 (*)    All other components within normal limits  LACTIC ACID, PLASMA  CBC WITH DIFFERENTIAL/PLATELET  LACTIC ACID, PLASMA  URINALYSIS, ROUTINE W REFLEX MICROSCOPIC     PROCEDURES:  Critical Care performed: No  Procedures   MEDICATIONS ORDERED IN ED: Medications  fluorescein ophthalmic strip 1 strip (1 strip Right Eye Given by Other 05/19/22 1908)  tetracaine (PONTOCAINE) 0.5 % ophthalmic  solution 2 drop (2 drops Right Eye Given by Other 05/19/22 1908)     IMPRESSION / MDM / ASSESSMENT AND PLAN / ED COURSE  I reviewed the triage vital signs and the nursing notes.                              76 y.o. male with past medical history of hypertension, hyperlipidemia, stroke with left facial droop, cognitive impairment, somatic symptom disorder, and benzodiazepine abuse who presents to the ED complaining of sandpaper sensation in his right eye as well as generalized fatigue with pain in his back.  Patient's presentation is most consistent with acute presentation with potential threat to life or bodily function.  Differential diagnosis includes, but is not limited to, lumbar strain,  lumbar radiculopathy, cauda equina, anemia, electrolyte abnormality, AKI, conjunctivitis, allergic conjunctivitis, dry eyes.  Patient well-appearing and in no acute distress, vital signs are unremarkable and he has no focal neurologic deficits beyond his known facial droop.  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  He denies any change in his visual acuity, pressure in right eye noted to be 9 and no fluorescein uptake noted with examination.  With sandpaperlike sensation and otherwise normal-appearing conjunctiva, suspect symptoms due to dry eye.  Work-up regarding his fatigue is unremarkable with no significant electrolyte abnormality, AKI, anemia, or leukocytosis.  No other symptoms to suggest infectious process.  He has no midline spinal tenderness and no history of trauma to necessitate imaging.  No symptoms to suggest cauda equina at this time.  He is appropriate for discharge back to his nursing facility with prescription for artificial tears, was counseled to follow-up with ophthalmology as needed.  Patient agrees with plan.      FINAL CLINICAL IMPRESSION(S) / ED DIAGNOSES   Final diagnoses:  Corneal irritation of right eye  Fatigue, unspecified type     Rx / DC Orders   ED Discharge Orders          Ordered    hydroxypropyl methylcellulose / hypromellose (ISOPTO TEARS / GONIOVISC) 2.5 % ophthalmic solution  3 times daily PRN        05/19/22 1912             Note:  This document was prepared using Dragon voice recognition software and may include unintentional dictation errors.   Chesley Noon, MD 05/19/22 Jerene Bears

## 2022-05-19 NOTE — ED Triage Notes (Addendum)
See First RN note.  Pt c/o low back pain for a few days.  Denies injury. Additionally, Pt c/o L eye pain.  Sts "it feels like there is sand in it."  No redness noted.  Pt is blind in L eye.

## 2022-05-19 NOTE — ED Provider Triage Note (Signed)
Emergency Medicine Provider Triage Evaluation Note  Johnny Rice , a 76 y.o. male  was evaluated in triage.  Pt complains of fatique per EMS. Patient brought from the [facility] via EMS.  C/o low back pain.    Review of Systems  Positive: + hx of CVA, + HOH Negative: No falls per patient.    Physical Exam  BP (!) 138/94 (BP Location: Left Arm)   Pulse 79   Temp 98.5 F (36.9 C) (Oral)   Resp 16   Ht 5\' 6"  (1.676 m)   Wt 76.7 kg   SpO2 97%   BMI 27.28 kg/m  Gen:   Awake, no distress  + HOH, left sided facial droop chronic Resp:  Normal effort  Lungs Clear,  MSK:   Moves extremities without difficulty  Other:    Medical Decision Making  Medically screening exam initiated at 3:07 PM.  Appropriate orders placed.  Johnny Rice was informed that the remainder of the evaluation will be completed by another provider, this initial triage assessment does not replace that evaluation, and the importance of remaining in the ED until their evaluation is complete.     [name], PA-C 05/19/22 1507

## 2022-05-21 ENCOUNTER — Ambulatory Visit (INDEPENDENT_AMBULATORY_CARE_PROVIDER_SITE_OTHER): Payer: Medicare Other | Admitting: Urology

## 2022-05-21 ENCOUNTER — Encounter: Payer: Self-pay | Admitting: Urology

## 2022-05-21 VITALS — BP 96/69 | HR 99 | Ht 66.0 in | Wt 169.0 lb

## 2022-05-21 DIAGNOSIS — N401 Enlarged prostate with lower urinary tract symptoms: Secondary | ICD-10-CM

## 2022-05-21 DIAGNOSIS — N390 Urinary tract infection, site not specified: Secondary | ICD-10-CM

## 2022-05-21 DIAGNOSIS — N138 Other obstructive and reflux uropathy: Secondary | ICD-10-CM | POA: Diagnosis not present

## 2022-05-21 LAB — BLADDER SCAN AMB NON-IMAGING

## 2022-05-21 NOTE — Progress Notes (Signed)
   05/21/2022 11:39 AM   Johnny Rice Aug 29, 1946 [id number]  Reason for visit: Follow up urinary symptoms, UTI  HPI: Extremely comorbid 76 year old male who resides permanently in assisted living facility who has had problems with recurrent UTIs in the past. He has mixed weak stream and overactive symptoms of urgency, frequency, and incontinence.  PVRs in my clinic have been normal in the past, and cystoscopy was essentially normal with a small nonobstructive appearing prostate, and normal cytology.  He had only minimal improvement on Flomax previously, and we discontinued this medication as it may have been causing some lightheadedness and weakness.  He had an elevated PVR off the Flomax, and he was changed to alfuzosin which seems to have improved his voiding.  PVR today normal at 130 mL, unable to give a urine for urinalysis.  He has had numerous ER visits over the last few weeks and notes were reviewed extensively.  Urine culture on 04/23/2022 showed 30k Enterococcus, and he is currently on a 10-day course of Augmentin.  The history is extremely challenging to obtain as he is very hard of hearing and has some confusion.  His aide with him today does not know much about his history.  He has had significant overall decline in his health over the last year compared to when I have seen him previously.  He previously has been trialed on Myrbetriq and Flomax without significant change in the urinary symptoms.  He also had a CT abdomen and pelvis and December 2022 that shows no significant urologic abnormalities, bladder nondistended, small prostate, groin hernias bilaterally but no significant bladder distortion on my review.  I personally viewed and interpreted those images.  76 year old very comorbid male with significant overall decline in his health over the last year, likely has had some recurrent UTIs contributing to his decline.  I recommended completing the 10-day course of culture appropriate  Augmentin for previous Enterococcus, continuing alfuzosin for his history of incomplete emptying, and taking cranberry tablets twice daily to help with UTI prevention.  I think with his comorbidities and frailty would need to have realistic expectations moving forward.  RTC 1 year PVR  Sondra Come, MD  [facility] [address] [place], [place] [address] [phone]

## 2022-05-27 ENCOUNTER — Encounter: Payer: Self-pay | Admitting: Emergency Medicine

## 2022-05-27 ENCOUNTER — Emergency Department: Payer: Medicare Other

## 2022-05-27 ENCOUNTER — Emergency Department
Admission: EM | Admit: 2022-05-27 | Discharge: 2022-05-27 | Disposition: A | Discharge status: Skilled Nursing Facility | Payer: Medicare Other | Attending: Emergency Medicine | Admitting: Emergency Medicine

## 2022-05-27 ENCOUNTER — Other Ambulatory Visit: Payer: Self-pay

## 2022-05-27 DIAGNOSIS — I10 Essential (primary) hypertension: Secondary | ICD-10-CM | POA: Diagnosis not present

## 2022-05-27 DIAGNOSIS — I959 Hypotension, unspecified: Secondary | ICD-10-CM | POA: Diagnosis not present

## 2022-05-27 DIAGNOSIS — H9190 Unspecified hearing loss, unspecified ear: Secondary | ICD-10-CM

## 2022-05-27 DIAGNOSIS — F32A Depression, unspecified: Secondary | ICD-10-CM | POA: Diagnosis present

## 2022-05-27 DIAGNOSIS — R45851 Suicidal ideations: Secondary | ICD-10-CM | POA: Insufficient documentation

## 2022-05-27 DIAGNOSIS — F1320 Sedative, hypnotic or anxiolytic dependence, uncomplicated: Secondary | ICD-10-CM | POA: Diagnosis present

## 2022-05-27 DIAGNOSIS — R419 Unspecified symptoms and signs involving cognitive functions and awareness: Secondary | ICD-10-CM | POA: Diagnosis present

## 2022-05-27 DIAGNOSIS — R519 Headache, unspecified: Secondary | ICD-10-CM | POA: Insufficient documentation

## 2022-05-27 DIAGNOSIS — I248 Other forms of acute ischemic heart disease: Secondary | ICD-10-CM | POA: Diagnosis present

## 2022-05-27 DIAGNOSIS — R296 Repeated falls: Secondary | ICD-10-CM

## 2022-05-27 DIAGNOSIS — Z20822 Contact with and (suspected) exposure to covid-19: Secondary | ICD-10-CM | POA: Insufficient documentation

## 2022-05-27 DIAGNOSIS — F418 Other specified anxiety disorders: Secondary | ICD-10-CM | POA: Diagnosis present

## 2022-05-27 DIAGNOSIS — F1021 Alcohol dependence, in remission: Secondary | ICD-10-CM

## 2022-05-27 DIAGNOSIS — F322 Major depressive disorder, single episode, severe without psychotic features: Secondary | ICD-10-CM

## 2022-05-27 DIAGNOSIS — Z8673 Personal history of transient ischemic attack (TIA), and cerebral infarction without residual deficits: Secondary | ICD-10-CM

## 2022-05-27 DIAGNOSIS — F419 Anxiety disorder, unspecified: Secondary | ICD-10-CM | POA: Diagnosis present

## 2022-05-27 LAB — CBC
HCT: 39.1 % (ref 39.0–52.0)
Hemoglobin: 12.8 g/dL — ABNORMAL LOW (ref 13.0–17.0)
MCH: 28.4 pg (ref 26.0–34.0)
MCHC: 32.7 g/dL (ref 30.0–36.0)
MCV: 86.9 fL (ref 80.0–100.0)
Platelets: 300 10*3/uL (ref 150–400)
RBC: 4.5 MIL/uL (ref 4.22–5.81)
RDW: 13.8 % (ref 11.5–15.5)
WBC: 7.9 10*3/uL (ref 4.0–10.5)
nRBC: 0 % (ref 0.0–0.2)

## 2022-05-27 LAB — URINE DRUG SCREEN, QUALITATIVE (ARMC ONLY)
Amphetamines, Ur Screen: NOT DETECTED
Barbiturates, Ur Screen: NOT DETECTED
Benzodiazepine, Ur Scrn: POSITIVE — AB
Cannabinoid 50 Ng, Ur ~~LOC~~: NOT DETECTED
Cocaine Metabolite,Ur ~~LOC~~: NOT DETECTED
MDMA (Ecstasy)Ur Screen: NOT DETECTED
Methadone Scn, Ur: NOT DETECTED
Opiate, Ur Screen: NOT DETECTED
Phencyclidine (PCP) Ur S: NOT DETECTED
Tricyclic, Ur Screen: NOT DETECTED

## 2022-05-27 LAB — URINALYSIS, COMPLETE (UACMP) WITH MICROSCOPIC
Bilirubin Urine: NEGATIVE
Glucose, UA: NEGATIVE mg/dL
Hgb urine dipstick: NEGATIVE
Ketones, ur: 5 mg/dL — AB
Nitrite: NEGATIVE
Protein, ur: NEGATIVE mg/dL
Specific Gravity, Urine: 1.019 (ref 1.005–1.030)
Squamous Epithelial / HPF: NONE SEEN (ref 0–5)
pH: 5 (ref 5.0–8.0)

## 2022-05-27 LAB — COMPREHENSIVE METABOLIC PANEL
ALT: 18 U/L (ref 0–44)
AST: 26 U/L (ref 15–41)
Albumin: 4.2 g/dL (ref 3.5–5.0)
Alkaline Phosphatase: 125 U/L (ref 38–126)
Anion gap: 8 (ref 5–15)
BUN: 21 mg/dL (ref 8–23)
CO2: 24 mmol/L (ref 22–32)
Calcium: 9.3 mg/dL (ref 8.9–10.3)
Chloride: 110 mmol/L (ref 98–111)
Creatinine, Ser: 1.18 mg/dL (ref 0.61–1.24)
GFR, Estimated: >60 mL/min (ref >60)
Glucose, Bld: 92 mg/dL (ref 70–99)
Potassium: 4.1 mmol/L (ref 3.5–5.1)
Sodium: 142 mmol/L (ref 135–145)
Total Bilirubin: 0.6 mg/dL (ref 0.3–1.2)
Total Protein: 7.4 g/dL (ref 6.5–8.1)

## 2022-05-27 LAB — ETHANOL: Alcohol, Ethyl (B): <10 mg/dL (ref <10)

## 2022-05-27 LAB — TSH: TSH: 2.098 u[IU]/mL (ref 0.350–4.500)

## 2022-05-27 LAB — SARS CORONAVIRUS 2 BY RT PCR: SARS Coronavirus 2 by RT PCR: NEGATIVE

## 2022-05-27 LAB — T4, FREE: Free T4: <0.25 ng/dL — ABNORMAL LOW (ref 0.61–1.12)

## 2022-05-27 LAB — TROPONIN I (HIGH SENSITIVITY): Troponin I (High Sensitivity): 6 ng/L (ref <18)

## 2022-05-27 LAB — ACETAMINOPHEN LEVEL: Acetaminophen (Tylenol), Serum: <10 ug/mL — ABNORMAL LOW (ref 10–30)

## 2022-05-27 LAB — SALICYLATE LEVEL: Salicylate Lvl: <7 mg/dL — ABNORMAL LOW (ref 7.0–30.0)

## 2022-05-27 MED ORDER — METOPROLOL TARTRATE 25 MG PO TABS
25.0000 mg | ORAL_TABLET | Freq: Two times a day (BID) | ORAL | Status: DC
Start: 2022-05-27 — End: 2022-05-28
  Filled 2022-05-27: qty 1

## 2022-05-27 MED ORDER — ATORVASTATIN CALCIUM 20 MG PO TABS
20.0000 mg | ORAL_TABLET | Freq: Every day | ORAL | Status: DC
Start: 2022-05-27 — End: 2022-05-28
  Administered 2022-05-27: 20 mg via ORAL
  Filled 2022-05-27: qty 1

## 2022-05-27 MED ORDER — GUAIFENESIN 100 MG/5ML PO LIQD
300.0000 mg | Freq: Four times a day (QID) | ORAL | Status: DC | PRN
Start: 2022-05-27 — End: 2022-05-28

## 2022-05-27 MED ORDER — MELOXICAM 7.5 MG PO TABS: 15.0000 mg | ORAL_TABLET | Freq: Every day | ORAL | Status: DC

## 2022-05-27 MED ORDER — LIOTHYRONINE SODIUM 25 MCG PO TABS
25.0000 ug | ORAL_TABLET | Freq: Every day | ORAL | Status: DC
  Administered 2022-05-27: 25 ug via ORAL
  Filled 2022-05-27: qty 1

## 2022-05-27 MED ORDER — SODIUM CHLORIDE 0.9 % IV BOLUS
1000.0000 mL | Freq: Once | INTRAVENOUS | Status: AC
  Administered 2022-05-27: 1000 mL via INTRAVENOUS

## 2022-05-27 MED ORDER — HYDROXYZINE HCL 10 MG PO TABS: 50.0000 mg | ORAL_TABLET | Freq: Three times a day (TID) | ORAL | Status: DC | PRN

## 2022-05-27 MED ORDER — DOCUSATE SODIUM 100 MG PO CAPS
100.0000 mg | ORAL_CAPSULE | Freq: Every day | ORAL | Status: DC
  Administered 2022-05-27: 100 mg via ORAL
  Filled 2022-05-27: qty 1

## 2022-05-27 MED ORDER — ACETAMINOPHEN 500 MG PO TABS
1000.0000 mg | ORAL_TABLET | Freq: Once | ORAL | Status: AC
  Administered 2022-05-27: 1000 mg via ORAL
  Filled 2022-05-27: qty 2

## 2022-05-27 MED ORDER — ASPIRIN 81 MG PO CHEW
81.0000 mg | CHEWABLE_TABLET | Freq: Every day | ORAL | Status: DC
Start: 2022-05-27 — End: 2022-05-28
  Administered 2022-05-27: 81 mg via ORAL
  Filled 2022-05-27: qty 1

## 2022-05-27 NOTE — ED Notes (Signed)
VOL/  PENDING  CONSULT 

## 2022-05-27 NOTE — ED Notes (Signed)
Pt given snack and drink at this time. 

## 2022-05-27 NOTE — ED Notes (Signed)
Patient belongings:  2 red shoes 1 red sweatshirt 1 pajama pants

## 2022-05-27 NOTE — ED Provider Notes (Addendum)
[facility] Provider Note    Event Date/Time   First MD Initiated Contact with Patient 05/27/22 1604     (approximate)   History   Psychiatric Evaluation   HPI  Johnny Rice is a 76 y.o. male past medical history of HTN, HDL, CVA with residual left-sided deficits and some cognitive impairment and depression as well as migraine headaches who presents for evaluation of depression.  Patient states he has a psychiatrist and is assisted living facility who he does not like and does not think is helping him.  He states he has had a slight headache today which he feels is typical of his usual migraines.  He denies any recent falls or injuries.  I spoke with daughter and staff facility and note that he was taken off his Abilify couple weeks ago and is on levothyroxine for thyroid but no other thyroid medications.  No other sick symptoms per staff although he did endorse that he was suicidal and wanted to kill self to staff.  He is denying this to this examiner stating he is just depressed.  We will consult psychiatry and TTS.   Past Medical History:  Diagnosis Date   Hypertension    Stroke [facility])        Past Medical History:  Diagnosis Date   Hypertension    Stroke [facility])      Physical Exam  Triage Vital Signs: ED Triage Vitals  Enc Vitals Group     BP 05/27/22 1228 (!) 82/64     Pulse Rate 05/27/22 1228 66     Resp 05/27/22 1228 18     Temp 05/27/22 1228 (!) 97.5 F (36.4 C)     Temp Source 05/27/22 1228 Oral     SpO2 05/27/22 1228 96 %     Weight 05/27/22 1227 167 lb 8.8 oz (76 kg)     Height 05/27/22 1227 5\' 6"  (1.676 m)     Head Circumference --      Peak Flow --      Pain Score 05/27/22 1226 8     Pain Loc --      Pain Edu? --      Excl. in GC? --     Most recent vital signs: Vitals:   05/27/22 1519 05/27/22 1736  BP: 100/77 133/85  Pulse: 65   Resp: 16   Temp:    SpO2: 100%     General: Awake, no distress.  CV:  Good peripheral  perfusion.  2+ radial pulse Resp:  Normal effort.  Clear bilaterally Abd:  No distention.  Soft Other:  Left-sided facial droop.  Has someslight weakness in the left hemibody compared to the right.  Otherwise seems to have full strength throughout the right and sensation is intact light touch throughout.  There does not seem to be any right-sided cranial nerve deficits.   ED Results / Procedures / Treatments  Labs (all labs ordered are listed, but only abnormal results are displayed) Labs Reviewed  SALICYLATE LEVEL - Abnormal; Notable for the following components:      Result Value   Salicylate Lvl <7.0 (*)    All other components within normal limits  ACETAMINOPHEN LEVEL - Abnormal; Notable for the following components:   Acetaminophen (Tylenol), Serum <10 (*)    All other components within normal limits  CBC - Abnormal; Notable for the following components:   Hemoglobin 12.8 (*)    All other components within normal limits  T4, FREE -  Abnormal; Notable for the following components:   Free T4 <0.25 (*)    All other components within normal limits  COMPREHENSIVE METABOLIC PANEL  ETHANOL  TSH  URINE DRUG SCREEN, QUALITATIVE ([facility] ONLY)  URINALYSIS, COMPLETE (UACMP) WITH MICROSCOPIC  TROPONIN I (HIGH SENSITIVITY)     EKG  EKG is remarkable for sinus bradycardia with ventricular rate of 58, normal axis, unremarkable intervals with nonspecific ST changes in inferior and lateral leads as well as V2 and V3.   RADIOLOGY  CT on my interpretation without evidence of acute ischemia, hemorrhage, edema, mass effect or other acute process.  There is evidence of large right old PCA stroke.  I also reviewed radiology's interpretation.  PROCEDURES:  Critical Care performed: No  Procedures    MEDICATIONS ORDERED IN ED: Medications  liothyronine (CYTOMEL) tablet 25 mcg (has no administration in time range)  sodium chloride 0.9 % bolus 1,000 mL (0 mLs Intravenous Stopped 05/27/22 1736)   acetaminophen (TYLENOL) tablet 1,000 mg (1,000 mg Oral Given 05/27/22 1659)     IMPRESSION / MDM / ASSESSMENT AND PLAN / ED COURSE  I reviewed the triage vital signs and the nursing notes. Patient's presentation is most consistent with acute presentation with potential threat to life or bodily function.                               Differential diagnosis includes, but is not limited to, underlying depression possibly exacerbated by discontinuation of Abilify.  It seems this was discontinued in the setting of recent hospitalization for orthostasis and a fall.  Patient was reportedly hypotensive in triage with a BP of 82/64 received 500 cc normal saline before being roomed to the ED bed.  On my assessment he is only complaining of mild headache which he states is very common for him.  He is denying any other acute symptoms other than feeling depressed.  He is normotensive and not orthostatic.  He does not appear septic and there is no obvious foci on history exam of an acute infectious process.  Routine psychiatric labs were sent.  CT head obtained to rule out an intracranial hemorrhage or new mass or edema.  I will obtain EKG to assess for any evidence of arrhythmia and troponin to assess for any evidence of ischemia today.  CMP without any significant electrolyte or metabolic derangements.  Serum acetaminophen, salicylate and ethanol level undetectable.  CBC without leukocytosis and hemoglobin of 12.8 compared to 13.28 days ago and normal platelets.  TSH WNL but free T4 is undetectable.  EKG is remarkable for sinus bradycardia with ventricular rate of 58, normal axis, unremarkable intervals with nonspecific ST changes in inferior and lateral leads as well as V2 and V3.  Troponin nonelevated 6 not suggestive of ischemia.  CT on my interpretation without evidence of acute ischemia, hemorrhage, edema, mass effect or other acute process.  There is evidence of large right old PCA stroke.  I also reviewed  radiology's interpretation.  Unclear etiology for patient's blood pressure which seems to have resolved by the time I am seeing him.  Unclear if this could be related to headache or not.  He is able to tolerate p.o. without difficulty and will be placed on regular diet.  At this point I think he is medically cleared.    On several assessments he is adamantly denying any suicidal thoughts and he just felt depressed.  He states he wishes to  go back home and is agreeable to speaking with a psychiatrist of his facility about possibly restarting his Abilify.  I attempted to reach his daughter to update her about the same to do so.  This point Evalose patient for immediate danger the patient will heart himself and I think close follow-up with psychiatry is reasonable.  Put in writing that patient should have his free T4 followed up and rechecked as well as consideration for restarting his Abilify.  Discharged in stable condition.     FINAL CLINICAL IMPRESSION(S) / ED DIAGNOSES   Final diagnoses:  Suicidal thoughts  Hypotension, unspecified hypotension type     Rx / DC Orders   ED Discharge Orders     None        Note:  This document was prepared using Dragon voice recognition software and may include unintentional dictation errors.   Gilles Chiquito, MD 05/27/22 2011    Gilles Chiquito, MD 05/27/22 2120

## 2022-05-27 NOTE — ED Notes (Signed)
Attempted to call daughter to update about patient discharge- no answer left voicemail.

## 2022-05-27 NOTE — Discharge Instructions (Addendum)
Free T4 must be rechecked.  Please reconsider restarting patient's Abilify.

## 2022-05-27 NOTE — ED Notes (Signed)
[facility] called for transport to [facility] 

## 2022-05-27 NOTE — ED Notes (Signed)
Discharge instructions discussed with staff at [facility]. Verbalized understanding to restart abilify per Newt Lukes NP. [facility] here to transport patient back to facility.

## 2022-05-27 NOTE — Consult Note (Signed)
[facility] Face-to-Face [facility]iat[facility] Consult   Reason for Consult: Psychiatric Evaluation Referring Physician: Dr. Katrinka BlazingSmith Patient Identification: Johnny Rice MRN:  [id number] Principal Diagnosis: <principal problem not specified> Diagnosis:  Active Problems:   Alcohol use disorder, severe, 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HCC)   Anxiety disorder, unspecified   Demand ischemia of myocardium (HCC)   Depression   Frequent falls   Hearing loss   History of CVA (cerebrovascular accident)   Neurocognitive disorder   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HCC)   Depression with anxiety   Total Time spent with patient: 1 hour  Subjective: "Can I go home?" Johnny Rice is a 76 y.o. male patient [facility] ED via [facility] and voluntary. The patient stated to the physical therapist he "no longer wanted to live like this." EMS was called for SI. The patient denies wanting to harm himself, but he does admit to being depressed and wants to speak to a mental health provider. The patient was on Abilify, but after a discussion with Dr. Katrinka BlazingSmith, the patient was taken off the medication. Dr. Katrinka BlazingSmith shared that [facility] [place] and recommended that the patient's Abilify be restarted. This provider saw The patient face-to-face; the chart was reviewed, and consulted with Dr. Katrinka BlazingSmith on 05/27/2022 due to the patient's care. It was discussed with the EDP that the patient does not meet the criteria to be admitted to the geriatric-psychiatric inpatient unit.   On evaluation, the patient is alert and oriented x 2-3, calm, restless but cooperative, and mood-congruent with affect. The patient does not appear to be responding to internal or external stimuli. Neither is the patient presenting with any delusional thinking. The patient denies auditory or visual hallucinations. The patient denies any suicidal, homicidal, or self-harm ideations. The patient is not presenting with any psychotic or paranoid behaviors.   HPI: Per Dr.  Katrinka BlazingSmith, Johnny Rice is a 76 y.o. male past medical history of HTN, HDL, CVA with residual left-sided deficits and some cognitive impairment and depression as well as migraine headaches who presents for evaluation of depression.  Patient states he has a psychiatrist and is assisted living facility who he does not like and does not think is helping him.  He states he has had a slight headache today which he feels is typical of his usual migraines.  He denies any recent falls or injuries.  I spoke with daughter and staff facility and note that he was taken off his Abilify couple weeks ago and is on levothyroxine for thyroid but no other thyroid medications.  No other sick symptoms per staff although he did endorse that he was suicidal and wanted to kill self to staff.  He is denying this to this examiner stating he is just depressed.  We will consult psychiatry and TTS.  Past Psychiatric History:  Stroke [facility](HCC)  Risk to Self:   Risk to Others:   Prior Inpatient Therapy:   Prior Outpatient Therapy:    Past Medical History:  Past Medical History:  Diagnosis Date   Hypertension    Stroke [facility](HCC)     Past Surgical History:  Procedure Laterality Date   APPENDECTOMY     Family History:  Family History  Problem Relation Age of Onset   Heart disease Neg Hx    Family Psychiatric  History:  Social History:  Social History   Substance and Sexual Activity  Alcohol Use Not Currently     Social History   Substance and Sexual Activity  Drug Use Not  Currently    Social History   Socioeconomic History   Marital status: Divorced    Spouse name: Not on file   Number of children: Not on file   Years of education: Not on file   Highest education level: Not on file  Occupational History   Not on file  Tobacco Use   Smoking status: Never    Passive exposure: Never   Smokeless tobacco: Never  Vaping Use   Vaping Use: Never used  Substance and Sexual Activity   Alcohol use: Not Currently    Drug use: Not Currently   Sexual activity: Not on file  Other Topics Concern   Not on file  Social History Narrative   Not on file   Social Determinants of Health   Financial Resource Strain: Not on file  Food Insecurity: Not on file  Transportation Needs: Not on file  Physical Activity: Not on file  Stress: Not on file  Social Connections: Not on file   Additional Social History:    Allergies:  No Known Allergies  Labs:  Results for orders placed or performed during the hospital encounter of 05/27/22 (from the past 48 hour(s))  Comprehensive metabolic panel     Status: None   Collection Time: 05/27/22 12:45 PM  Result Value Ref Range   Sodium 142 135 - 145 mmol/L   Potassium 4.1 3.5 - 5.1 mmol/L   Chloride 110 98 - 111 mmol/L   CO2 24 22 - 32 mmol/L   Glucose, Bld 92 70 - 99 mg/dL    Comment: Glucose reference range applies only to samples taken after fasting for at least 8 hours.   BUN 21 8 - 23 mg/dL   Creatinine, Ser 4.09 0.61 - 1.24 mg/dL   Calcium 9.3 8.9 - 81.1 mg/dL   Total Protein 7.4 6.5 - 8.1 g/dL   Albumin 4.2 3.5 - 5.0 g/dL   AST 26 15 - 41 U/L   ALT 18 0 - 44 U/L   Alkaline Phosphatase 125 38 - 126 U/L   Total Bilirubin 0.6 0.3 - 1.2 mg/dL   GFR, Estimated >91 >47 mL/min    Comment: (NOTE) Calculated using the CKD-EPI Creatinine Equation (2021)    Anion gap 8 5 - 15    Comment: Performed at [facility], [address] [place], [place] [address]  Ethanol     Status: None   Collection Time: 05/27/22 12:45 PM  Result Value Ref Range   Alcohol, Ethyl (B) <10 <10 mg/dL    Comment: (NOTE) Lowest detectable limit for serum alcohol is 10 mg/dL.  For medical purposes only. Performed at [facility], [address] [place], [place] [address]   Salicylate level     Status: Abnormal   Collection Time: 05/27/22 12:45 PM  Result Value Ref Range   Salicylate Lvl <7.0 (L) 7.0 - 30.0 mg/dL    Comment: Performed at [facility], [address] [place], [place] [address]  Acetaminophen level     Status: Abnormal   Collection Time: 05/27/22 12:45 PM  Result Value Ref Range   Acetaminophen (Tylenol), Serum <10 (L) 10 - 30 ug/mL    Comment: (NOTE) Therapeutic concentrations vary significantly. A range of 10-30 ug/mL  may be an effective concentration for many patients. However, some  are best treated at concentrations outside of this range. Acetaminophen concentrations >150 ug/mL at 4 hours after ingestion  and >50 ug/mL at 12 hours after ingestion are often associated  with  toxic reactions.  Performed at [facility], [address] [place], [place] [address]   cbc     Status: Abnormal   Collection Time: 05/27/22 12:45 PM  Result Value Ref Range   WBC 7.9 4.0 - 10.5 K/uL   RBC 4.50 4.22 - 5.81 MIL/uL   Hemoglobin 12.8 (L) 13.0 - 17.0 g/dL   HCT 59.5 63.8 - 75.6 %   MCV 86.9 80.0 - 100.0 fL   MCH 28.4 26.0 - 34.0 pg   MCHC 32.7 30.0 - 36.0 g/dL   RDW 43.3 29.5 - 18.8 %   Platelets 300 150 - 400 K/uL   nRBC 0.0 0.0 - 0.2 %    Comment: Performed at [facility], [address] [place], [place] [address]  T4, free     Status: Abnormal   Collection Time: 05/27/22 12:53 PM  Result Value Ref Range   Free T4 <0.25 (L) 0.61 - 1.12 ng/dL    Comment: (NOTE) Biotin ingestion may interfere with free T4 tests. If the results are inconsistent with the TSH level, previous test results, or the clinical presentation, then consider biotin interference. If needed, order repeat testing after stopping biotin. Performed at [facility], [address] [place], [place] [address]   Troponin I (High Sensitivity)     Status: None   Collection Time: 05/27/22 12:53 PM  Result Value Ref Range   Troponin I (High Sensitivity) 6 <18 ng/L    Comment: (NOTE) Elevated high sensitivity troponin I (hsTnI) values and significant  changes across serial measurements may suggest ACS but many other   chronic and acute conditions are known to elevate hsTnI results.  Refer to the "Links" section for chest pain algorithms and additional  guidance. Performed at [facility], [address] [place], [place] [address]   TSH     Status: None   Collection Time: 05/27/22 12:53 PM  Result Value Ref Range   TSH 2.098 0.350 - 4.500 uIU/mL    Comment: Performed by a 3rd Generation assay with a functional sensitivity of <=0.01 uIU/mL. Performed at [facility], [address] [place], [place] [address]   SARS Coronavirus 2 by RT PCR (hospital order, performed in [facility] hospital lab) *cepheid single result test* Anterior Nasal Swab     Status: None   Collection Time: 05/27/22  6:49 PM   Specimen: Anterior Nasal Swab  Result Value Ref Range   SARS Coronavirus 2 by RT PCR NEGATIVE NEGATIVE    Comment: (NOTE) SARS-CoV-2 target nucleic acids are NOT DETECTED.  The SARS-CoV-2 RNA is generally detectable in upper and lower respiratory specimens during the acute phase of infection. The lowest concentration of SARS-CoV-2 viral copies this assay can detect is 250 copies / mL. A negative result does not preclude SARS-CoV-2 infection and should not be used as the sole basis for treatment or other patient management decisions.  A negative result may occur with improper specimen collection / handling, submission of specimen other than nasopharyngeal swab, presence of viral mutation(s) within the areas targeted by this assay, and inadequate number of viral copies (<250 copies / mL). A negative result must be combined with clinical observations, patient history, and epidemiological information.  Fact Sheet for Patients:   RoadLapTop.co.za  Fact Sheet for Healthcare Providers: http://kim-miller.com/  This test is not yet approved or  cleared by the [place] FDA and has been authorized for detection and/or diagnosis of  SARS-CoV-2 by FDA under  an Emergency Use Authorization (EUA).  This EUA will remain in effect (meaning this test can be used) for the duration of the COVID-19 declaration under Section 564(b)(1) of the Act, 21 U.S.C. section 360bbb-3(b)(1), unless the authorization is terminated or revoked sooner.  Performed at [facility], [address] [place], [place] [address]   Urine Drug Screen, Qualitative     Status: Abnormal   Collection Time: 05/27/22  8:22 PM  Result Value Ref Range   Tricyclic, Ur Screen NONE DETECTED NONE DETECTED   Amphetamines, Ur Screen NONE DETECTED NONE DETECTED   MDMA (Ecstasy)Ur Screen NONE DETECTED NONE DETECTED   Cocaine Metabolite,Ur [place] NONE DETECTED NONE DETECTED   Opiate, Ur Screen NONE DETECTED NONE DETECTED   Phencyclidine (PCP) Ur S NONE DETECTED NONE DETECTED   Cannabinoid 50 Ng, Ur [place] NONE DETECTED NONE DETECTED   Barbiturates, Ur Screen NONE DETECTED NONE DETECTED   Benzodiazepine, Ur Scrn POSITIVE (A) NONE DETECTED   Methadone Scn, Ur NONE DETECTED NONE DETECTED    Comment: (NOTE) Tricyclics + metabolites, urine    Cutoff 1000 ng/mL Amphetamines + metabolites, urine  Cutoff 1000 ng/mL MDMA (Ecstasy), urine              Cutoff 500 ng/mL Cocaine Metabolite, urine          Cutoff 300 ng/mL Opiate + metabolites, urine        Cutoff 300 ng/mL Phencyclidine (PCP), urine         Cutoff 25 ng/mL Cannabinoid, urine                 Cutoff 50 ng/mL Barbiturates + metabolites, urine  Cutoff 200 ng/mL Benzodiazepine, urine              Cutoff 200 ng/mL Methadone, urine                   Cutoff 300 ng/mL  The urine drug screen provides only a preliminary, unconfirmed analytical test result and should not be used for non-medical purposes. Clinical consideration and professional judgment should be applied to any positive drug screen result due to possible interfering substances. A more specific alternate chemical method must be used in order to  obtain a confirmed analytical result. Gas chromatography / mass spectrometry (GC/MS) is the preferred confirm atory method. Performed at [facility], [address] [place], [place] [address]   Urinalysis, Complete w Microscopic     Status: Abnormal   Collection Time: 05/27/22  8:22 PM  Result Value Ref Range   Color, Urine YELLOW (A) YELLOW   APPearance HAZY (A) CLEAR   Specific Gravity, Urine 1.019 1.005 - 1.030   pH 5.0 5.0 - 8.0   Glucose, UA NEGATIVE NEGATIVE mg/dL   Hgb urine dipstick NEGATIVE NEGATIVE   Bilirubin Urine NEGATIVE NEGATIVE   Ketones, ur 5 (A) NEGATIVE mg/dL   Protein, ur NEGATIVE NEGATIVE mg/dL   Nitrite NEGATIVE NEGATIVE   Leukocytes,Ua TRACE (A) NEGATIVE   RBC / HPF 0-5 0 - 5 RBC/hpf   WBC, UA 0-5 0 - 5 WBC/hpf   Bacteria, UA RARE (A) NONE SEEN   Squamous Epithelial / LPF NONE SEEN 0 - 5   Mucus PRESENT     Comment: Performed at [facility], [address] [place], [place] [address]    Current Facility-Administered Medications  Medication Dose Route Frequency Provider Last Rate Last Admin   aspirin chewable tablet 81 mg  81 mg Oral Daily Antoine Primas  P, MD   81 mg at 05/27/22 2016   atorvastatin (LIPITOR) tablet 20 mg  20 mg Oral QHS Gilles Chiquito, MD   20 mg at 05/27/22 2017   docusate sodium (COLACE) capsule 100 mg  100 mg Oral Daily Gilles Chiquito, MD   100 mg at 05/27/22 2017   guaiFENesin (ROBITUSSIN) 100 MG/5ML liquid 300 mg  300 mg Oral Q6H PRN Gilles Chiquito, MD       hydrOXYzine (ATARAX) tablet 50 mg  50 mg Oral Q8H PRN Gilles Chiquito, MD       liothyronine (CYTOMEL) tablet 25 mcg  25 mcg Oral Daily Gilles Chiquito, MD   25 mcg at 05/27/22 2017   meloxicam (MOBIC) tablet 15 mg  15 mg Oral Daily Gilles Chiquito, MD       metoprolol tartrate (LOPRESSOR) tablet 25 mg  25 mg Oral BID Gilles Chiquito, MD       Current Outpatient Medications  Medication Sig Dispense Refill   alfuzosin (UROXATRAL) 10 MG 24 hr  tablet Take 10 mg by mouth in the morning.     alum & mag hydroxide-simeth (MAALOX/MYLANTA) 200-200-20 MG/5ML suspension Take 30 mLs by mouth 4 (four) times daily as needed (heartburn or indigestion).     ARIPiprazole (ABILIFY) 10 MG tablet Take 1 tablet (10 mg total) by mouth daily. 30 tablet 0   aspirin 81 MG chewable tablet Chew 81 mg by mouth daily.     atorvastatin (LIPITOR) 20 MG tablet Take 20 mg by mouth at bedtime.     barrier cream (NON-SPECIFIED) CREA Apply 1 application topically as needed (post-toileting skin protection).     clonazePAM (KLONOPIN) 1 MG tablet Take 1 mg by mouth 3 (three) times daily as needed.     docusate sodium (COLACE) 100 MG capsule Take 1 capsule (100 mg total) by mouth daily. 90 capsule 3   ergocalciferol (VITAMIN D2) 1.25 MG (50000 UT) capsule Take 50,000 Units by mouth once a week.     guaifenesin (ROBITUSSIN) 100 MG/5ML syrup Take 300 mg by mouth every 6 (six) hours as needed for cough.     hydrocortisone cream 0.5 % Apply 1 application topically 2 (two) times daily. (Apply to left temple and under left eye)     hydroxypropyl methylcellulose / hypromellose (ISOPTO TEARS / GONIOVISC) 2.5 % ophthalmic solution Place 1 drop into both eyes 3 (three) times daily as needed for dry eyes. 15 mL 12   hydrOXYzine (VISTARIL) 50 MG capsule Take 50 mg by mouth every 8 (eight) hours as needed (agitation).     liothyronine (CYTOMEL) 25 MCG tablet Take 25 mcg by mouth daily.     lisinopril (ZESTRIL) 40 MG tablet Take 40 mg by mouth daily.     loperamide (IMODIUM) 2 MG capsule Take 4 mg by mouth every 4 (four) hours as needed for diarrhea or loose stools. (Max 8 doses in 24 hours)     magnesium hydroxide (MILK OF MAGNESIA) 400 MG/5ML suspension Take 30 mLs by mouth daily as needed for mild constipation.     meloxicam (MOBIC) 15 MG tablet Take 15 mg by mouth daily.     metoprolol tartrate (LOPRESSOR) 25 MG tablet Take 25 mg by mouth 2 (two) times daily.      neomycin-bacitracin-polymyxin (NEOSPORIN) [phone] ointment Apply 1 application topically daily as needed (wound care). (Apply to right big toe)     polyethylene glycol powder (GLYCOLAX/MIRALAX) 17 GM/SCOOP powder Take 17 g by  mouth once.     polyvinyl alcohol (LIQUIFILM TEARS) 1.4 % ophthalmic solution Place 2 drops into both eyes daily.     traZODone (DESYREL) 100 MG tablet Take 100 mg by mouth at bedtime.      Musculoskeletal: Strength & Muscle Tone: within normal limits Gait & Station: normal Patient leans: N/A  Psychiatric Specialty Exam:  Presentation  General Appearance: Appropriate for Environment  Eye Contact:Good  Speech:Clear and Coherent  Speech Volume:Normal  Handedness:No data recorded  Mood and Affect  Mood:Anxious  Affect:Blunt; Flat   Thought Process  Thought Processes:Coherent  Descriptions of Associations:Intact  Orientation:Full (Time, Place and Person)  Thought Content:Obsessions; Logical  History of Schizophrenia/Schizoaffective disorder:No data recorded Duration of Psychotic Symptoms:No data recorded Hallucinations:Hallucinations: None  Ideas of Reference:None  Suicidal Thoughts:Suicidal Thoughts: No  Homicidal Thoughts:Homicidal Thoughts: No   Sensorium  Memory:Immediate Fair  Judgment:Fair  Insight:Poor   Executive Functions  Concentration:Good  Attention Span:Good  Recall:Fair  Fund of Knowledge:Fair  Language:Fair   Psychomotor Activity  Psychomotor Activity:Psychomotor Activity: Decreased   Assets  Assets:Communication Skills; Desire for Improvement; Physical Health; Resilience   Sleep  Sleep:Sleep: Fair   Physical Exam: Physical Exam Vitals and nursing note reviewed.  Constitutional:      Appearance: Normal appearance.  HENT:     Head: Normocephalic and atraumatic.     Right Ear: External ear normal.     Left Ear: External ear normal.     Nose: Nose normal.  Cardiovascular:     Rate and Rhythm:  Normal rate.     Pulses: Normal pulses.  Pulmonary:     Effort: Pulmonary effort is normal.  Musculoskeletal:        General: Normal range of motion.     Cervical back: Normal range of motion and neck supple.  Neurological:     Mental Status: He is alert. Mental status is at baseline.  Psychiatric:        Attention and Perception: Attention and perception normal.        Mood and Affect: Mood is anxious and depressed. Affect is blunt.        Speech: Speech is delayed.        Behavior: Behavior is slowed and withdrawn.        Thought Content: Thought content normal.        Judgment: Judgment is inappropriate.    Review of Systems  Psychiatric/Behavioral:  The patient is nervous/anxious and has insomnia.   All other systems reviewed and are negative.  Blood pressure 128/83, pulse 68, temperature 98 F (36.7 C), temperature source Oral, resp. rate 18, height 5\' 6"  (1.676 m), weight 76 kg, SpO2 99 %. Body mass index is 27.04 kg/m.  Treatment Plan Summary: Plan Patient does not meet the criteria for geriatric-psychiatric inpatient admission  Disposition: No evidence of imminent risk to self or others at present.   Patient does not meet criteria for psychiatric inpatient admission. Supportive therapy provided about ongoing stressors. Discussed crisis plan, support from social network, calling 911, coming to the Emergency Department, and calling Suicide Hotline.  , NP 05/27/2022 10:03 PM

## 2022-05-27 NOTE — ED Triage Notes (Signed)
Patient to ED via [facility] from the [facility] for psych evaluation. Patient told physical therapist today that he "no longer wanted to live like this" and they called EMS for SI. Patient states he does not want to hurt himself but would like to get help for depression. Hx of stroke with defects on left side.

## 2022-07-09 ENCOUNTER — Emergency Department: Payer: Medicare Other

## 2022-07-09 ENCOUNTER — Other Ambulatory Visit: Payer: Self-pay

## 2022-07-09 ENCOUNTER — Emergency Department
Admission: EM | Admit: 2022-07-09 | Discharge: 2022-07-10 | Disposition: A | Discharge status: Skilled Nursing Facility | Payer: Medicare Other | Attending: Emergency Medicine | Admitting: Emergency Medicine

## 2022-07-09 DIAGNOSIS — N390 Urinary tract infection, site not specified: Secondary | ICD-10-CM

## 2022-07-09 DIAGNOSIS — Z20822 Contact with and (suspected) exposure to covid-19: Secondary | ICD-10-CM | POA: Diagnosis not present

## 2022-07-09 DIAGNOSIS — R531 Weakness: Secondary | ICD-10-CM | POA: Diagnosis present

## 2022-07-09 LAB — CBC WITH DIFFERENTIAL/PLATELET
Abs Immature Granulocytes: 0.07 10*3/uL (ref 0.00–0.07)
Basophils Absolute: 0.1 10*3/uL (ref 0.0–0.1)
Basophils Relative: 1 %
Eosinophils Absolute: 0.3 10*3/uL (ref 0.0–0.5)
Eosinophils Relative: 4 %
HCT: 38.7 % — ABNORMAL LOW (ref 39.0–52.0)
Hemoglobin: 12.5 g/dL — ABNORMAL LOW (ref 13.0–17.0)
Immature Granulocytes: 1 %
Lymphocytes Relative: 30 %
Lymphs Abs: 1.8 10*3/uL (ref 0.7–4.0)
MCH: 28.2 pg (ref 26.0–34.0)
MCHC: 32.3 g/dL (ref 30.0–36.0)
MCV: 87.2 fL (ref 80.0–100.0)
Monocytes Absolute: 0.6 10*3/uL (ref 0.1–1.0)
Monocytes Relative: 10 %
Neutro Abs: 3.2 10*3/uL (ref 1.7–7.7)
Neutrophils Relative %: 54 %
Platelets: 227 10*3/uL (ref 150–400)
RBC: 4.44 MIL/uL (ref 4.22–5.81)
RDW: 13.9 % (ref 11.5–15.5)
WBC: 6 10*3/uL (ref 4.0–10.5)
nRBC: 0 % (ref 0.0–0.2)

## 2022-07-09 LAB — BASIC METABOLIC PANEL
Anion gap: 6 (ref 5–15)
BUN: 8 mg/dL (ref 8–23)
CO2: 27 mmol/L (ref 22–32)
Calcium: 8.7 mg/dL — ABNORMAL LOW (ref 8.9–10.3)
Chloride: 105 mmol/L (ref 98–111)
Creatinine, Ser: 0.93 mg/dL (ref 0.61–1.24)
GFR, Estimated: >60 mL/min (ref >60)
Glucose, Bld: 102 mg/dL — ABNORMAL HIGH (ref 70–99)
Potassium: 3.4 mmol/L — ABNORMAL LOW (ref 3.5–5.1)
Sodium: 138 mmol/L (ref 135–145)

## 2022-07-09 LAB — HEPATIC FUNCTION PANEL
ALT: 18 U/L (ref 0–44)
AST: 22 U/L (ref 15–41)
Albumin: 3.9 g/dL (ref 3.5–5.0)
Alkaline Phosphatase: 137 U/L — ABNORMAL HIGH (ref 38–126)
Bilirubin, Direct: 0.1 mg/dL (ref 0.0–0.2)
Indirect Bilirubin: 0.5 mg/dL (ref 0.3–0.9)
Total Bilirubin: 0.6 mg/dL (ref 0.3–1.2)
Total Protein: 6.6 g/dL (ref 6.5–8.1)

## 2022-07-09 LAB — LACTIC ACID, PLASMA
Lactic Acid, Venous: 1.4 mmol/L (ref 0.5–1.9)
Lactic Acid, Venous: 1.3 mmol/L (ref 0.5–1.9)

## 2022-07-09 LAB — CBC
HCT: 38.2 % — ABNORMAL LOW (ref 39.0–52.0)
Hemoglobin: 12.6 g/dL — ABNORMAL LOW (ref 13.0–17.0)
MCH: 28.4 pg (ref 26.0–34.0)
MCHC: 33 g/dL (ref 30.0–36.0)
MCV: 86.2 fL (ref 80.0–100.0)
Platelets: 223 10*3/uL (ref 150–400)
RBC: 4.43 MIL/uL (ref 4.22–5.81)
RDW: 14 % (ref 11.5–15.5)
WBC: 6 10*3/uL (ref 4.0–10.5)
nRBC: 0 % (ref 0.0–0.2)

## 2022-07-09 LAB — RESP PANEL BY RT-PCR (FLU A&B, COVID) ARPGX2
Influenza A by PCR: NEGATIVE
Influenza B by PCR: NEGATIVE
SARS Coronavirus 2 by RT PCR: NEGATIVE

## 2022-07-09 LAB — URINALYSIS, ROUTINE W REFLEX MICROSCOPIC
Bilirubin Urine: NEGATIVE
Glucose, UA: NEGATIVE mg/dL
Hgb urine dipstick: NEGATIVE
Ketones, ur: NEGATIVE mg/dL
Nitrite: NEGATIVE
Protein, ur: NEGATIVE mg/dL
Specific Gravity, Urine: 1.004 — ABNORMAL LOW (ref 1.005–1.030)
pH: 6 (ref 5.0–8.0)

## 2022-07-09 LAB — TROPONIN I (HIGH SENSITIVITY)
Troponin I (High Sensitivity): 4 ng/L (ref <18)
Troponin I (High Sensitivity): 3 ng/L (ref <18)

## 2022-07-09 MED ORDER — SODIUM CHLORIDE 0.9 % IV SOLN
1.0000 g | Freq: Once | INTRAVENOUS | Status: AC
  Administered 2022-07-09: 1 g via INTRAVENOUS
  Filled 2022-07-09: qty 10

## 2022-07-09 MED ORDER — CEPHALEXIN 500 MG PO CAPS: 500.0000 mg | ORAL_CAPSULE | Freq: Three times a day (TID) | ORAL | 0 refills | Status: AC

## 2022-07-09 MED ORDER — CLONAZEPAM 0.5 MG PO TABS
1.0000 mg | ORAL_TABLET | Freq: Once | ORAL | Status: AC
  Administered 2022-07-09: 1 mg via ORAL
  Filled 2022-07-09: qty 2

## 2022-07-09 MED ORDER — TRAZODONE HCL 100 MG PO TABS: 100.0000 mg | ORAL_TABLET | Freq: Every day | ORAL | Status: DC

## 2022-07-09 NOTE — ED Provider Notes (Addendum)
[facility] Provider Note    Event Date/Time   First MD Initiated Contact with Patient 07/09/22 1700     (approximate)   History   Weakness   HPI  Johnny Rice is a 76 y.o. male who reports he had a feeling of weakness in his head earlier today.  He thinks his blood pressure was very high and its come down now and he is feeling back to normal.  He denied any visual changes or numbness or weakness of his arms or legs or trouble with speech or any other problems just his head felt weak.  Again he is feeling back to normal now.  He has a history of having an old stroke.      Physical Exam   Triage Vital Signs: ED Triage Vitals [07/09/22 1450]  Enc Vitals Group     BP 128/84     Pulse Rate (!) 50     Resp 16     Temp 98.2 F (36.8 C)     Temp Source Oral     SpO2 96 %     Weight 167 lb 8.8 oz (76 kg)     Height 5\' 6"  (1.676 m)     Head Circumference      Peak Flow      Pain Score 0     Pain Loc      Pain Edu?      Excl. in [place]?     Most recent vital signs: Vitals:   07/09/22 1730 07/09/22 1845  BP: (!) 157/103 (!) 165/92  Pulse: (!) 52 (!) 55  Resp: 18 17  Temp:  98.5 F (36.9 C)  SpO2: 97% 98%     General: Awake, no distress.  Head normocephalic atraumatic there is some left-sided facial droop.  This is from his old stroke CV:  Good peripheral perfusion.  Heart regular rate and rhythm no audible murmurs Resp:  Normal effort.  Lungs are clear Abd:  No distention.  Soft and nontender Neuro patient reports no neurological changes that are new.  No changes in his stroke symptoms.   ED Results / Procedures / Treatments   Labs (all labs ordered are listed, but only abnormal results are displayed) Labs Reviewed  BASIC METABOLIC PANEL - Abnormal; Notable for the following components:      Result Value   Potassium 3.4 (*)    Glucose, Bld 102 (*)    Calcium 8.7 (*)    All other components within normal limits  CBC - Abnormal; Notable  for the following components:   Hemoglobin 12.6 (*)    HCT 38.2 (*)    All other components within normal limits  URINALYSIS, ROUTINE W REFLEX MICROSCOPIC - Abnormal; Notable for the following components:   Color, Urine YELLOW (*)    APPearance CLEAR (*)    Specific Gravity, Urine 1.004 (*)    Leukocytes,Ua LARGE (*)    Bacteria, UA RARE (*)    All other components within normal limits  HEPATIC FUNCTION PANEL - Abnormal; Notable for the following components:   Alkaline Phosphatase 137 (*)    All other components within normal limits  CBC WITH DIFFERENTIAL/PLATELET - Abnormal; Notable for the following components:   Hemoglobin 12.5 (*)    HCT 38.7 (*)    All other components within normal limits  RESP PANEL BY RT-PCR (FLU A&B, COVID) [id number]  LACTIC ACID, PLASMA  LACTIC ACID, PLASMA  CBG MONITORING, ED  TROPONIN I (HIGH  SENSITIVITY)  TROPONIN I (HIGH SENSITIVITY)     EKG  EKG read interpreted by me shows sinus bradycardia rate of 58 left axis nonspecific ST-T wave changes very low voltage in the precordial leads Similar to EKG from last month  RADIOLOGY CT angio shows multiple small pulmonary nodules.  I will have him follow-up with his doctor for that.  There is no sign of anything else going on in the chest.   PROCEDURES:  Critical Care performed:   Procedures   MEDICATIONS ORDERED IN ED: Medications  cefTRIAXone (ROCEPHIN) 1 g in sodium chloride 0.9 % 100 mL IVPB (1 g Intravenous New Bag/Given 07/09/22 2059)  clonazePAM (KLONOPIN) tablet 1 mg (1 mg Oral Given 07/09/22 1933)     IMPRESSION / MDM / ASSESSMENT AND PLAN / ED COURSE  I reviewed the triage vital signs and the nursing notes. Patient has a UTI.  This is likely what is causing his weakness.  I cannot find anything else.  I will let him go with some antibiotics.  I have given him a dose of antibiotics here as well.  Patient's presentation is most consistent with acute complicated illness / injury requiring  diagnostic workup.  The patient is on the cardiac monitor to evaluate for evidence of arrhythmia and/or significant heart rate changes.  None have been seen.     FINAL CLINICAL IMPRESSION(S) / ED DIAGNOSES   Final diagnoses:  Weakness  Urinary tract infection with hematuria, site unspecified     Rx / DC Orders   ED Discharge Orders          Ordered    cephALEXin (KEFLEX) 500 MG capsule  3 times daily        07/09/22 2028             Note:  This document was prepared using Dragon voice recognition software and may include unintentional dictation errors.   Nena Polio, MD 07/09/22 2051    Nena Polio, MD 07/09/22 2100

## 2022-07-09 NOTE — ED Triage Notes (Signed)
Arrives via [facility].  Per report, patient felt "different today".  Stroke scale negative.  CBG:  123.  VS wnl.  Patient has history of stroke with residual left facial droop.

## 2022-07-09 NOTE — ED Notes (Signed)
Patient currently sleeping.  Will hold trazodone at this time

## 2022-07-09 NOTE — Discharge Instructions (Addendum)
It looks like you may have a little urinary tract infection.  I will give you some antibiotics for that.  Please do not hesitate to return for any worsening.  That includes any fever or vomiting or feeling weaker or anything at all.  Please follow-up with your regular doctor in the next few days.  Everything else looks pretty good.  I have given you 1 dose of antibiotics here tonight.  Try to get the Keflex antibiotic 1 pill 3 times a day started tomorrow.  Marland Kitchen

## 2022-07-09 NOTE — ED Notes (Signed)
Called [facility] for transport back to [facility] °

## 2022-07-09 NOTE — ED Notes (Signed)
Patient ambulatory to restroom at this time.  Steady gait noted 

## 2022-07-09 NOTE — ED Notes (Signed)
Patient requesting trazodone.  States he takes it at night and wants a dose while he has to wait for discharge.  MD aware and verbal order received

## 2022-07-09 NOTE — ED Triage Notes (Signed)
Pt here c/o weakness. Pt extremely hard of hearing.

## 2022-07-09 NOTE — ED Notes (Signed)
Patient provided with sandwich box.  Updated on plan of care.  Appears agitated and upset.  Attempted to reassure patient.

## 2022-07-09 NOTE — ED Notes (Signed)
Patient currently remains asleep on stretcher.  Waiting for transportation for discharge.  Will continue to monitor

## 2022-11-16 ENCOUNTER — Emergency Department: Payer: Medicare Other

## 2022-11-16 ENCOUNTER — Other Ambulatory Visit: Payer: Self-pay

## 2022-11-16 ENCOUNTER — Emergency Department
Admission: EM | Admit: 2022-11-16 | Discharge: 2022-11-16 | Disposition: A | Discharge status: Skilled Nursing Facility | Payer: Medicare Other | Attending: Emergency Medicine | Admitting: Emergency Medicine

## 2022-11-16 DIAGNOSIS — J101 Influenza due to other identified influenza virus with other respiratory manifestations: Secondary | ICD-10-CM | POA: Diagnosis not present

## 2022-11-16 DIAGNOSIS — R4182 Altered mental status, unspecified: Secondary | ICD-10-CM | POA: Insufficient documentation

## 2022-11-16 DIAGNOSIS — Z1152 Encounter for screening for COVID-19: Secondary | ICD-10-CM | POA: Insufficient documentation

## 2022-11-16 DIAGNOSIS — N3090 Cystitis, unspecified without hematuria: Secondary | ICD-10-CM

## 2022-11-16 DIAGNOSIS — K8020 Calculus of gallbladder without cholecystitis without obstruction: Secondary | ICD-10-CM | POA: Diagnosis not present

## 2022-11-16 LAB — CBC
HCT: 42.4 % (ref 39.0–52.0)
Hemoglobin: 14 g/dL (ref 13.0–17.0)
MCH: 28.7 pg (ref 26.0–34.0)
MCHC: 33 g/dL (ref 30.0–36.0)
MCV: 86.9 fL (ref 80.0–100.0)
Platelets: 221 10*3/uL (ref 150–400)
RBC: 4.88 MIL/uL (ref 4.22–5.81)
RDW: 13.9 % (ref 11.5–15.5)
WBC: 4.8 10*3/uL (ref 4.0–10.5)
nRBC: 0 % (ref 0.0–0.2)

## 2022-11-16 LAB — BASIC METABOLIC PANEL
Anion gap: 7 (ref 5–15)
BUN: 21 mg/dL (ref 8–23)
CO2: 21 mmol/L — ABNORMAL LOW (ref 22–32)
Calcium: 8.7 mg/dL — ABNORMAL LOW (ref 8.9–10.3)
Chloride: 108 mmol/L (ref 98–111)
Creatinine, Ser: 1.16 mg/dL (ref 0.61–1.24)
GFR, Estimated: >60 mL/min (ref >60)
Glucose, Bld: 138 mg/dL — ABNORMAL HIGH (ref 70–99)
Potassium: 3.4 mmol/L — ABNORMAL LOW (ref 3.5–5.1)
Sodium: 136 mmol/L (ref 135–145)

## 2022-11-16 LAB — URINALYSIS, ROUTINE W REFLEX MICROSCOPIC
Bilirubin Urine: NEGATIVE
Glucose, UA: NEGATIVE mg/dL
Hgb urine dipstick: NEGATIVE
Ketones, ur: 5 mg/dL — AB
Nitrite: NEGATIVE
Protein, ur: 100 mg/dL — AB
Specific Gravity, Urine: 1.018 (ref 1.005–1.030)
WBC, UA: >50 WBC/hpf (ref 0–5)
pH: 8 (ref 5.0–8.0)

## 2022-11-16 LAB — TROPONIN I (HIGH SENSITIVITY)
Troponin I (High Sensitivity): 8 ng/L (ref <18)
Troponin I (High Sensitivity): 11 ng/L (ref <18)

## 2022-11-16 LAB — RESP PANEL BY RT-PCR (RSV, FLU A&B, COVID)  RVPGX2
Influenza A by PCR: NEGATIVE
Influenza B by PCR: POSITIVE — AB
Resp Syncytial Virus by PCR: NEGATIVE
SARS Coronavirus 2 by RT PCR: NEGATIVE

## 2022-11-16 LAB — TSH: TSH: 1.363 u[IU]/mL (ref 0.350–4.500)

## 2022-11-16 LAB — T4, FREE: Free T4: 0.29 ng/dL — ABNORMAL LOW (ref 0.61–1.12)

## 2022-11-16 MED ORDER — OSELTAMIVIR PHOSPHATE 75 MG PO CAPS: 75.0000 mg | ORAL_CAPSULE | Freq: Two times a day (BID) | ORAL | 0 refills | Status: AC

## 2022-11-16 MED ORDER — CEPHALEXIN 500 MG PO CAPS
500.0000 mg | ORAL_CAPSULE | Freq: Once | ORAL | Status: AC
  Administered 2022-11-16: 500 mg via ORAL
  Filled 2022-11-16: qty 1

## 2022-11-16 MED ORDER — CEPHALEXIN 500 MG PO CAPS: 500.0000 mg | ORAL_CAPSULE | Freq: Two times a day (BID) | ORAL | 0 refills | Status: DC

## 2022-11-16 NOTE — ED Notes (Signed)
RN called report to [facility] of [place], [place] and gave to [facility] who will be taking over care. Cherlyn Cushing reported that she is reaching out to patient's daughter for a safe ride home. Cherlyn Cushing denies questions, concerns or needs at this time.

## 2022-11-16 NOTE — Discharge Instructions (Addendum)
Your tests today show a urinary tract infection and influenza infection.  Take Keflex as prescribed to cure the bladder infection. Tamiflu may help lesson any symptoms related to the flu.

## 2022-11-16 NOTE — ED Provider Notes (Signed)
[facility] Provider Note    Event Date/Time   First MD Initiated Contact with Patient 11/16/22 1433     (approximate)   History   Altered Mental Status   HPI  Johnny Rice is a 77 y.o. male   Past medical history of dementia, previous CVA with left-sided deficits, presents from facility with confusion from baseline.  History of stroke and left-sided deficits at baseline per facility, transient chest pain complaint from facility today as well, and reportedly strong urine odor.  Patient with no acute medical complaints.  Oriented to self and situation.   External Medical Documents Reviewed: Emergency department visit dated 07/09/2022 for weakness and diagnosed with UTI then.      Physical Exam   Triage Vital Signs: ED Triage Vitals  Enc Vitals Group     BP 11/16/22 1329 101/72     Pulse Rate 11/16/22 1329 100     Resp 11/16/22 1329 19     Temp 11/16/22 1329 98.6 F (37 C)     Temp src --      SpO2 11/16/22 1329 96 %     Weight --      Height --      Head Circumference --      Peak Flow --      Pain Score 11/16/22 1328 5     Pain Loc --      Pain Edu? --      Excl. in [place]? --     Most recent vital signs: Vitals:   11/16/22 1329  BP: 101/72  Pulse: 100  Resp: 19  Temp: 98.6 F (37 C)  SpO2: 96%    General: Awake, no distress.  CV:  Good peripheral perfusion.  Resp:  Normal effort.  Abd:  No distention.  Other:  Awake alert oriented to self and situation, left-sided deficits from prior stroke, abdomen soft nontender lungs clear.  Skin appears warm well-perfused and hemodynamics appropriate reassuring afebrile.   ED Results / Procedures / Treatments   Labs (all labs ordered are listed, but only abnormal results are displayed) Labs Reviewed  BASIC METABOLIC PANEL - Abnormal; Notable for the following components:      Result Value   Potassium 3.4 (*)    CO2 21 (*)    Glucose, Bld 138 (*)    Calcium 8.7 (*)    All other  components within normal limits  RESP PANEL BY RT-PCR (RSV, FLU A&B, COVID)  RVPGX2  CBC  URINALYSIS, ROUTINE W REFLEX MICROSCOPIC  TSH  T4, FREE  TROPONIN I (HIGH SENSITIVITY)  TROPONIN I (HIGH SENSITIVITY)     I ordered and reviewed the above labs they are notable for he has a creatinine of 1.16 and a white blood cell count is normal 4.8 and hemoglobin 14, his initial troponin is 8  EKG  ED ECG REPORT I, Lucillie Garfinkel, the attending physician, personally viewed and interpreted this ECG.   Date: 11/16/2022  EKG Time: 1335  Rate: 100  Rhythm: nsr  Axis: nl  Intervals:none  ST&T Change: No acute ischemic changes    RADIOLOGY I independently reviewed and interpreted chest x-ray see no obvious focalities or pneumothorax   PROCEDURES:  Critical Care performed: No  Procedures   MEDICATIONS ORDERED IN ED: Medications - No data to display   IMPRESSION / MDM / [place] / ED COURSE  I reviewed the triage vital signs and the nursing notes.  Patient's presentation is most consistent with acute presentation with potential threat to life or bodily function.  Differential diagnosis includes, but is not limited to, confusion reported from facility with multiple medical comorbidities including cognitive impairment and stroke, consider infection, electrolyte derangement, ACS   The patient is on the cardiac monitor to evaluate for evidence of arrhythmia and/or significant heart rate changes.  MDM: Complaint reported from facility of chest pain will get EKG and troponins rule out ACS, patient denies pain at this time.  Given increased confusion will check for metabolic or infectious etiologies with basic labs, chest x-ray, UA, viral swabs.  Patient signed out to oncoming provider pending workup as above in stable condition.         FINAL CLINICAL IMPRESSION(S) / ED DIAGNOSES   Final diagnoses:  Altered mental status, unspecified  altered mental status type     Rx / DC Orders   ED Discharge Orders     None        Note:  This document was prepared using Dragon voice recognition software and may include unintentional dictation errors.    Lucillie Garfinkel, MD 11/16/22 [phone]

## 2022-11-16 NOTE — ED Provider Notes (Signed)
Procedures     ----------------------------------------- 5:13 PM on 11/16/2022 -----------------------------------------   UA consistent with UTI.  Will prescribe Keflex.  Influenza B positive.  Will start Tamiflu given patient's frail health.  Stable for discharge.   Carrie Mew, MD 11/16/22 [phone]

## 2022-11-16 NOTE — ED Triage Notes (Addendum)
Pt comes via EMS from [facility] with c/o confusion. Pt did have stroke with hx of left facial droop which is present.   Pt states he is hurting in his chest. Pt states this started this afternoon.   Pt is oriented to person and place. Pt does have strong urine odor

## 2022-11-16 NOTE — ED Notes (Addendum)
Pt noted to have urinated in depends. Pt cleaned, barrier cream placed on inner thigh and around the groin, due to redness. EDP aware. Male purewick placed on pt and new depends also placed on pt. Primary RN notified

## 2022-11-16 NOTE — ED Triage Notes (Signed)
First Nurse Note;  Pt via EMS from the [facility]. Pt c/o confusion. Pt has a hx of stroke L facial droop at baseline. Pt is at his baseline per staff.  138/72  94 HR 95% on RA 98.3 171 CBG

## 2022-12-22 ENCOUNTER — Other Ambulatory Visit: Payer: Self-pay

## 2022-12-22 ENCOUNTER — Emergency Department
Admission: EM | Admit: 2022-12-22 | Discharge: 2022-12-22 | Disposition: A | Discharge status: Home / Self Care | Payer: Medicare Other | Attending: Emergency Medicine | Admitting: Emergency Medicine

## 2022-12-22 ENCOUNTER — Encounter: Payer: Self-pay | Admitting: Emergency Medicine

## 2022-12-22 DIAGNOSIS — S01512A Laceration without foreign body of oral cavity, initial encounter: Secondary | ICD-10-CM | POA: Diagnosis present

## 2022-12-22 DIAGNOSIS — X58XXXA Exposure to other specified factors, initial encounter: Secondary | ICD-10-CM | POA: Diagnosis not present

## 2022-12-22 MED ORDER — LIDOCAINE VISCOUS HCL 2 % MT SOLN
15.0000 mL | Freq: Once | OROMUCOSAL | Status: AC
  Administered 2022-12-22: 15 mL via OROMUCOSAL
  Filled 2022-12-22: qty 15

## 2022-12-22 MED ORDER — LIDOCAINE VISCOUS HCL 2 % MT SOLN: 10.0000 mL | OROMUCOSAL | 0 refills | Status: AC | PRN

## 2022-12-22 MED ORDER — ACETAMINOPHEN 500 MG PO TABS
1000.0000 mg | ORAL_TABLET | Freq: Once | ORAL | Status: AC
  Administered 2022-12-22: 1000 mg via ORAL
  Filled 2022-12-22: qty 2

## 2022-12-22 NOTE — ED Provider Notes (Signed)
[facility] Provider Note  Patient Contact: 7:02 PM (approximate)   History   Fall   HPI  Johnny Rice is a 77 y.o. male who presents the emergency department for evaluation of a tongue injury.  Patient states that this morning he tripped, bit his tongue.  Did not fall, did not sustain any other injury.  States that his tongue has been painful and he presents for evaluation to ensure it did not need stitches as well as for any relief of pain.  No active bleeding.  Again patient denies any other injury other than biting his tongue.  Injury occurred to the right side of the tongue.     Physical Exam   Triage Vital Signs: ED Triage Vitals [12/22/22 1746]  Enc Vitals Group     BP (!) 115/59     Pulse Rate (!) 108     Resp 18     Temp 98 F (36.7 C)     Temp Source Oral     SpO2 98 %     Weight 167 lb 8.8 oz (76 kg)     Height '5\' 7"'$  (1.702 m)     Head Circumference      Peak Flow      Pain Score 10     Pain Loc      Pain Edu?      Excl. in [place]?     Most recent vital signs: Vitals:   12/22/22 1746  BP: (!) 115/59  Pulse: (!) 108  Resp: 18  Temp: 98 F (36.7 C)  SpO2: 98%     General: Alert and in no acute distress. ENT:      Ears:       Nose: No congestion/rhinnorhea.      Mouth/Throat: Mucous membranes are moist.  Visualization of the tongue reveals a superficial laceration along the right side of the tongue.  No active bleeding.  No visible foreign body.  No other signs of intraoral trauma.  Cardiovascular:  Good peripheral perfusion Respiratory: Normal respiratory effort without tachypnea or retractions. Lungs CTAB Musculoskeletal: Full range of motion to all extremities.  Neurologic:  No gross focal neurologic deficits are appreciated.  Skin:   No rash noted Other:   ED Results / Procedures / Treatments   Labs (all labs ordered are listed, but only abnormal results are displayed) Labs Reviewed - No data to  display   EKG     RADIOLOGY    No results found.  PROCEDURES:  Critical Care performed: No  Procedures   MEDICATIONS ORDERED IN ED: Medications  lidocaine (XYLOCAINE) 2 % viscous mouth solution 15 mL (has no administration in time range)  acetaminophen (TYLENOL) tablet 1,000 mg (1,000 mg Oral Given 12/22/22 1751)     IMPRESSION / MDM / ASSESSMENT AND PLAN / ED COURSE  I reviewed the triage vital signs and the nursing notes.                                 Differential diagnosis includes, but is not limited to, tongue laceration, head injury, dental trauma  Patient's presentation is most consistent with acute presentation with potential threat to life or bodily function.   Patient's diagnosis is consistent with tongue laceration.  Patient presents emergency department after tripping and biting his tongue.  Patient states that he tripped, did not fall, did not sustain other injury other than a tongue  injury.  He has a superficial laceration that does not require closure.  Will prescribe viscous lidocaine for symptom relief.  No indication for labs or imaging.  Follow-up with primary care as needed..  Patient is given ED precautions to return to the ED for any worsening or new symptoms.     FINAL CLINICAL IMPRESSION(S) / ED DIAGNOSES   Final diagnoses:  Laceration of tongue, initial encounter     Rx / DC Orders   ED Discharge Orders          Ordered    lidocaine (XYLOCAINE) 2 % solution  Every 4 hours PRN        12/22/22 1905             Note:  This document was prepared using Dragon voice recognition software and may include unintentional dictation errors.   Brynda Peon 12/22/22 1905    Lucillie Garfinkel, MD 12/22/22 [phone]

## 2022-12-22 NOTE — ED Notes (Signed)
Pt waiting on [facility] for transport back to [facility].

## 2022-12-22 NOTE — ED Notes (Signed)
First nurse-pt brought in via ems from the [facility].  Per ems, pt fell out of his walker today.  No loc. Pt not taking meds for 4-5 days.  Pt threatening self harm if not taken to hospital per ems.   Pt in wheelchair in lobby.  Bp 180/110 per ems

## 2022-12-22 NOTE — ED Notes (Signed)
called to [facility] for transport to facility[facility]/rep:zach.Marland KitchenMarland Kitchen

## 2022-12-22 NOTE — ED Triage Notes (Signed)
Pt presents to the ED via [facility] due to a fall this morning. Pt states he tripped over a cord and bit down on his tongue. Pt unsure about blood thinners. No bleeding or cut visible on the tongue in triage. Pt is blind at baseline.

## 2023-01-05 ENCOUNTER — Inpatient Hospital Stay
Admission: EM | Admit: 2023-01-05 | Discharge: 2023-01-19 | DRG: 853 | Disposition: A | Discharge status: Skilled Nursing Facility | Payer: Medicare Other | Source: Skilled Nursing Facility | Attending: Hospitalist | Admitting: Hospitalist

## 2023-01-05 ENCOUNTER — Emergency Department: Payer: Medicare Other

## 2023-01-05 ENCOUNTER — Inpatient Hospital Stay: Payer: Medicare Other | Admitting: Anesthesiology

## 2023-01-05 ENCOUNTER — Other Ambulatory Visit: Payer: Self-pay

## 2023-01-05 ENCOUNTER — Encounter
Admission: EM | Disposition: A | Discharge status: Skilled Nursing Facility | Payer: Self-pay | Source: Skilled Nursing Facility | Attending: Internal Medicine

## 2023-01-05 DIAGNOSIS — E039 Hypothyroidism, unspecified: Secondary | ICD-10-CM | POA: Diagnosis present

## 2023-01-05 DIAGNOSIS — R066 Hiccough: Secondary | ICD-10-CM | POA: Diagnosis not present

## 2023-01-05 DIAGNOSIS — G9340 Encephalopathy, unspecified: Secondary | ICD-10-CM

## 2023-01-05 DIAGNOSIS — N12 Tubulo-interstitial nephritis, not specified as acute or chronic: Secondary | ICD-10-CM | POA: Diagnosis not present

## 2023-01-05 DIAGNOSIS — N390 Urinary tract infection, site not specified: Secondary | ICD-10-CM | POA: Diagnosis not present

## 2023-01-05 DIAGNOSIS — R7881 Bacteremia: Secondary | ICD-10-CM | POA: Diagnosis not present

## 2023-01-05 DIAGNOSIS — I809 Phlebitis and thrombophlebitis of unspecified site: Secondary | ICD-10-CM

## 2023-01-05 DIAGNOSIS — F419 Anxiety disorder, unspecified: Secondary | ICD-10-CM | POA: Diagnosis present

## 2023-01-05 DIAGNOSIS — N201 Calculus of ureter: Secondary | ICD-10-CM

## 2023-01-05 DIAGNOSIS — N179 Acute kidney failure, unspecified: Secondary | ICD-10-CM | POA: Diagnosis not present

## 2023-01-05 DIAGNOSIS — G510 Bell's palsy: Secondary | ICD-10-CM | POA: Diagnosis present

## 2023-01-05 DIAGNOSIS — G8929 Other chronic pain: Secondary | ICD-10-CM | POA: Diagnosis present

## 2023-01-05 DIAGNOSIS — N139 Obstructive and reflux uropathy, unspecified: Secondary | ICD-10-CM | POA: Diagnosis not present

## 2023-01-05 DIAGNOSIS — N136 Pyonephrosis: Secondary | ICD-10-CM | POA: Diagnosis present

## 2023-01-05 DIAGNOSIS — A419 Sepsis, unspecified organism: Secondary | ICD-10-CM | POA: Diagnosis not present

## 2023-01-05 DIAGNOSIS — G9341 Metabolic encephalopathy: Secondary | ICD-10-CM | POA: Diagnosis not present

## 2023-01-05 DIAGNOSIS — I69354 Hemiplegia and hemiparesis following cerebral infarction affecting left non-dominant side: Secondary | ICD-10-CM | POA: Diagnosis not present

## 2023-01-05 DIAGNOSIS — B952 Enterococcus as the cause of diseases classified elsewhere: Secondary | ICD-10-CM

## 2023-01-05 DIAGNOSIS — Z1152 Encounter for screening for COVID-19: Secondary | ICD-10-CM

## 2023-01-05 DIAGNOSIS — M25561 Pain in right knee: Secondary | ICD-10-CM | POA: Diagnosis present

## 2023-01-05 DIAGNOSIS — R6520 Severe sepsis without septic shock: Secondary | ICD-10-CM | POA: Diagnosis not present

## 2023-01-05 DIAGNOSIS — G928 Other toxic encephalopathy: Secondary | ICD-10-CM | POA: Diagnosis present

## 2023-01-05 DIAGNOSIS — T50905A Adverse effect of unspecified drugs, medicaments and biological substances, initial encounter: Secondary | ICD-10-CM | POA: Diagnosis present

## 2023-01-05 DIAGNOSIS — Z87442 Personal history of urinary calculi: Secondary | ICD-10-CM

## 2023-01-05 DIAGNOSIS — X58XXXA Exposure to other specified factors, initial encounter: Secondary | ICD-10-CM | POA: Diagnosis present

## 2023-01-05 DIAGNOSIS — S01512A Laceration without foreign body of oral cavity, initial encounter: Secondary | ICD-10-CM | POA: Diagnosis present

## 2023-01-05 DIAGNOSIS — I10 Essential (primary) hypertension: Secondary | ICD-10-CM | POA: Diagnosis present

## 2023-01-05 DIAGNOSIS — R3911 Hesitancy of micturition: Secondary | ICD-10-CM | POA: Diagnosis not present

## 2023-01-05 DIAGNOSIS — E785 Hyperlipidemia, unspecified: Secondary | ICD-10-CM | POA: Diagnosis present

## 2023-01-05 DIAGNOSIS — Z791 Long term (current) use of non-steroidal anti-inflammatories (NSAID): Secondary | ICD-10-CM

## 2023-01-05 DIAGNOSIS — N200 Calculus of kidney: Secondary | ICD-10-CM

## 2023-01-05 DIAGNOSIS — I69392 Facial weakness following cerebral infarction: Secondary | ICD-10-CM

## 2023-01-05 DIAGNOSIS — H9190 Unspecified hearing loss, unspecified ear: Secondary | ICD-10-CM | POA: Diagnosis present

## 2023-01-05 DIAGNOSIS — M25562 Pain in left knee: Secondary | ICD-10-CM | POA: Diagnosis present

## 2023-01-05 DIAGNOSIS — Z8672 Personal history of thrombophlebitis: Secondary | ICD-10-CM

## 2023-01-05 DIAGNOSIS — A4181 Sepsis due to Enterococcus: Principal | ICD-10-CM | POA: Diagnosis present

## 2023-01-05 DIAGNOSIS — R0789 Other chest pain: Secondary | ICD-10-CM | POA: Diagnosis present

## 2023-01-05 DIAGNOSIS — Z87440 Personal history of urinary (tract) infections: Secondary | ICD-10-CM

## 2023-01-05 DIAGNOSIS — R410 Disorientation, unspecified: Secondary | ICD-10-CM | POA: Diagnosis not present

## 2023-01-05 DIAGNOSIS — Z9049 Acquired absence of other specified parts of digestive tract: Secondary | ICD-10-CM

## 2023-01-05 DIAGNOSIS — N401 Enlarged prostate with lower urinary tract symptoms: Secondary | ICD-10-CM | POA: Diagnosis not present

## 2023-01-05 DIAGNOSIS — S01512S Laceration without foreign body of oral cavity, sequela: Secondary | ICD-10-CM | POA: Diagnosis not present

## 2023-01-05 DIAGNOSIS — H5462 Unqualified visual loss, left eye, normal vision right eye: Secondary | ICD-10-CM | POA: Diagnosis present

## 2023-01-05 DIAGNOSIS — Z79899 Other long term (current) drug therapy: Secondary | ICD-10-CM

## 2023-01-05 DIAGNOSIS — Z8673 Personal history of transient ischemic attack (TIA), and cerebral infarction without residual deficits: Secondary | ICD-10-CM

## 2023-01-05 DIAGNOSIS — R079 Chest pain, unspecified: Secondary | ICD-10-CM | POA: Diagnosis not present

## 2023-01-05 DIAGNOSIS — R001 Bradycardia, unspecified: Secondary | ICD-10-CM | POA: Diagnosis not present

## 2023-01-05 DIAGNOSIS — Z7982 Long term (current) use of aspirin: Secondary | ICD-10-CM

## 2023-01-05 DIAGNOSIS — N400 Enlarged prostate without lower urinary tract symptoms: Secondary | ICD-10-CM | POA: Diagnosis present

## 2023-01-05 DIAGNOSIS — F0390 Unspecified dementia without behavioral disturbance: Secondary | ICD-10-CM | POA: Diagnosis present

## 2023-01-05 HISTORY — PX: CYSTOSCOPY W/ URETERAL STENT PLACEMENT: SHX1429

## 2023-01-05 LAB — SODIUM, URINE, RANDOM: Sodium, Ur: 71 mmol/L

## 2023-01-05 LAB — CBC WITH DIFFERENTIAL/PLATELET
Abs Immature Granulocytes: 0.13 10*3/uL — ABNORMAL HIGH (ref 0.00–0.07)
Basophils Absolute: 0.1 10*3/uL (ref 0.0–0.1)
Basophils Relative: 0 %
Eosinophils Absolute: 0 10*3/uL (ref 0.0–0.5)
Eosinophils Relative: 0 %
HCT: 37 % — ABNORMAL LOW (ref 39.0–52.0)
Hemoglobin: 12.8 g/dL — ABNORMAL LOW (ref 13.0–17.0)
Immature Granulocytes: 1 %
Lymphocytes Relative: 3 %
Lymphs Abs: 0.6 10*3/uL — ABNORMAL LOW (ref 0.7–4.0)
MCH: 29.4 pg (ref 26.0–34.0)
MCHC: 34.6 g/dL (ref 30.0–36.0)
MCV: 85.1 fL (ref 80.0–100.0)
Monocytes Absolute: 1.4 10*3/uL — ABNORMAL HIGH (ref 0.1–1.0)
Monocytes Relative: 8 %
Neutro Abs: 16.6 10*3/uL — ABNORMAL HIGH (ref 1.7–7.7)
Neutrophils Relative %: 88 %
Platelets: 248 10*3/uL (ref 150–400)
RBC: 4.35 MIL/uL (ref 4.22–5.81)
RDW: 14.3 % (ref 11.5–15.5)
WBC: 18.7 10*3/uL — ABNORMAL HIGH (ref 4.0–10.5)
nRBC: 0 % (ref 0.0–0.2)

## 2023-01-05 LAB — RESP PANEL BY RT-PCR (RSV, FLU A&B, COVID)  RVPGX2
Influenza A by PCR: NEGATIVE
Influenza B by PCR: NEGATIVE
Resp Syncytial Virus by PCR: NEGATIVE
SARS Coronavirus 2 by RT PCR: NEGATIVE

## 2023-01-05 LAB — URINALYSIS, COMPLETE (UACMP) WITH MICROSCOPIC
Bilirubin Urine: NEGATIVE
Glucose, UA: NEGATIVE mg/dL
Ketones, ur: 5 mg/dL — AB
Nitrite: NEGATIVE
Protein, ur: 100 mg/dL — AB
Specific Gravity, Urine: 1.019 (ref 1.005–1.030)
Squamous Epithelial / HPF: NONE SEEN /HPF (ref 0–5)
WBC, UA: >50 WBC/hpf (ref 0–5)
pH: 5 (ref 5.0–8.0)

## 2023-01-05 LAB — CREATININE, URINE, RANDOM: Creatinine, Urine: 109 mg/dL

## 2023-01-05 LAB — LACTIC ACID, PLASMA
Lactic Acid, Venous: 1.8 mmol/L (ref 0.5–1.9)
Lactic Acid, Venous: 1.1 mmol/L (ref 0.5–1.9)

## 2023-01-05 LAB — POTASSIUM: Potassium: 4 mmol/L (ref 3.5–5.1)

## 2023-01-05 LAB — COMPREHENSIVE METABOLIC PANEL
ALT: 13 U/L (ref 0–44)
AST: 25 U/L (ref 15–41)
Albumin: 3.7 g/dL (ref 3.5–5.0)
Alkaline Phosphatase: 162 U/L — ABNORMAL HIGH (ref 38–126)
Anion gap: 11 (ref 5–15)
BUN: 25 mg/dL — ABNORMAL HIGH (ref 8–23)
CO2: 20 mmol/L — ABNORMAL LOW (ref 22–32)
Calcium: 8.8 mg/dL — ABNORMAL LOW (ref 8.9–10.3)
Chloride: 108 mmol/L (ref 98–111)
Creatinine, Ser: 1.64 mg/dL — ABNORMAL HIGH (ref 0.61–1.24)
GFR, Estimated: 44 mL/min — ABNORMAL LOW (ref >60)
Glucose, Bld: 155 mg/dL — ABNORMAL HIGH (ref 70–99)
Potassium: 3.3 mmol/L — ABNORMAL LOW (ref 3.5–5.1)
Sodium: 139 mmol/L (ref 135–145)
Total Bilirubin: 2.7 mg/dL — ABNORMAL HIGH (ref 0.3–1.2)
Total Protein: 7.3 g/dL (ref 6.5–8.1)

## 2023-01-05 LAB — APTT: aPTT: 38 seconds — ABNORMAL HIGH (ref 24–36)

## 2023-01-05 LAB — PROTIME-INR
INR: 1.3 — ABNORMAL HIGH (ref 0.8–1.2)
Prothrombin Time: 15.7 seconds — ABNORMAL HIGH (ref 11.4–15.2)

## 2023-01-05 LAB — MAGNESIUM: Magnesium: 1.5 mg/dL — ABNORMAL LOW (ref 1.7–2.4)

## 2023-01-05 SURGERY — CYSTOSCOPY, WITH RETROGRADE PYELOGRAM AND URETERAL STENT INSERTION
Anesthesia: General | Site: Ureter | Laterality: Left

## 2023-01-05 MED ORDER — LACTATED RINGERS IV BOLUS
500.0000 mL | Freq: Once | INTRAVENOUS | Status: AC
  Administered 2023-01-05: 500 mL via INTRAVENOUS

## 2023-01-05 MED ORDER — EPHEDRINE 5 MG/ML INJ
INTRAVENOUS | Status: AC
  Filled 2023-01-05: qty 5

## 2023-01-05 MED ORDER — OXYCODONE HCL 5 MG PO TABS: 5.0000 mg | ORAL_TABLET | Freq: Once | ORAL | Status: DC | PRN

## 2023-01-05 MED ORDER — ACETAMINOPHEN 650 MG RE SUPP
650.0000 mg | RECTAL | Status: DC | PRN
  Administered 2023-01-05 – 2023-01-08 (×2): 650 mg via RECTAL
  Filled 2023-01-05 (×2): qty 1
  Filled 2023-01-05: qty 2

## 2023-01-05 MED ORDER — FENTANYL CITRATE (PF) 100 MCG/2ML IJ SOLN
INTRAMUSCULAR | Status: DC | PRN
  Administered 2023-01-05: 50 ug via INTRAVENOUS

## 2023-01-05 MED ORDER — SODIUM CHLORIDE 0.9 % IV SOLN: INTRAVENOUS | Status: DC

## 2023-01-05 MED ORDER — SUCCINYLCHOLINE CHLORIDE 200 MG/10ML IV SOSY
PREFILLED_SYRINGE | INTRAVENOUS | Status: DC | PRN
  Administered 2023-01-05: 35 mg via INTRAVENOUS

## 2023-01-05 MED ORDER — ONDANSETRON HCL 4 MG PO TABS: 4.0000 mg | ORAL_TABLET | Freq: Four times a day (QID) | ORAL | Status: DC | PRN

## 2023-01-05 MED ORDER — ONDANSETRON HCL 4 MG/2ML IJ SOLN
INTRAMUSCULAR | Status: AC
  Filled 2023-01-05: qty 2

## 2023-01-05 MED ORDER — METRONIDAZOLE 500 MG/100ML IV SOLN
500.0000 mg | Freq: Once | INTRAVENOUS | Status: AC
  Administered 2023-01-05: 500 mg via INTRAVENOUS
  Filled 2023-01-05: qty 100

## 2023-01-05 MED ORDER — FENTANYL CITRATE (PF) 100 MCG/2ML IJ SOLN
INTRAMUSCULAR | Status: AC
  Filled 2023-01-05: qty 2

## 2023-01-05 MED ORDER — LACTATED RINGERS IV BOLUS
1000.0000 mL | Freq: Once | INTRAVENOUS | Status: AC
  Administered 2023-01-05: 1000 mL via INTRAVENOUS

## 2023-01-05 MED ORDER — FENTANYL CITRATE (PF) 100 MCG/2ML IJ SOLN: 25.0000 ug | INTRAMUSCULAR | Status: DC | PRN

## 2023-01-05 MED ORDER — ONDANSETRON HCL 4 MG/2ML IJ SOLN
4.0000 mg | Freq: Four times a day (QID) | INTRAMUSCULAR | Status: DC | PRN
  Administered 2023-01-05: 4 mg via INTRAVENOUS

## 2023-01-05 MED ORDER — EPHEDRINE SULFATE (PRESSORS) 50 MG/ML IJ SOLN
INTRAMUSCULAR | Status: DC | PRN
  Administered 2023-01-05 (×2): 10 mg via INTRAVENOUS

## 2023-01-05 MED ORDER — ROCURONIUM BROMIDE 100 MG/10ML IV SOLN
INTRAVENOUS | Status: DC | PRN
  Administered 2023-01-05: 30 mg via INTRAVENOUS

## 2023-01-05 MED ORDER — DEXAMETHASONE SODIUM PHOSPHATE 10 MG/ML IJ SOLN
INTRAMUSCULAR | Status: DC | PRN
  Administered 2023-01-05: 4 mg via INTRAVENOUS

## 2023-01-05 MED ORDER — PROPOFOL 10 MG/ML IV BOLUS
INTRAVENOUS | Status: DC | PRN
  Administered 2023-01-05: 70 mg via INTRAVENOUS

## 2023-01-05 MED ORDER — IOHEXOL 180 MG/ML  SOLN
INTRAMUSCULAR | Status: DC | PRN
  Administered 2023-01-05: 10 mL

## 2023-01-05 MED ORDER — SUGAMMADEX SODIUM 200 MG/2ML IV SOLN
INTRAVENOUS | Status: DC | PRN
  Administered 2023-01-05: 180 mg via INTRAVENOUS

## 2023-01-05 MED ORDER — OXYCODONE HCL 5 MG/5ML PO SOLN: 5.0000 mg | Freq: Once | ORAL | Status: DC | PRN

## 2023-01-05 MED ORDER — SODIUM CHLORIDE 0.9 % IV SOLN
2.0000 g | INTRAVENOUS | Status: DC
  Administered 2023-01-05: 2 g via INTRAVENOUS
  Filled 2023-01-05 (×2): qty 20

## 2023-01-05 MED ORDER — ENOXAPARIN SODIUM 40 MG/0.4ML IJ SOSY
40.0000 mg | PREFILLED_SYRINGE | INTRAMUSCULAR | Status: DC
  Administered 2023-01-05 – 2023-01-18 (×14): 40 mg via SUBCUTANEOUS
  Filled 2023-01-05 (×14): qty 0.4

## 2023-01-05 MED ORDER — LACTATED RINGERS IV SOLN: INTRAVENOUS | Status: DC | PRN

## 2023-01-05 MED ORDER — SODIUM CHLORIDE 0.9 % IV SOLN
2.0000 g | Freq: Once | INTRAVENOUS | Status: AC
  Administered 2023-01-05: 2 g via INTRAVENOUS
  Filled 2023-01-05: qty 12.5

## 2023-01-05 MED ORDER — LIDOCAINE HCL (CARDIAC) PF 100 MG/5ML IV SOSY
PREFILLED_SYRINGE | INTRAVENOUS | Status: DC | PRN
  Administered 2023-01-05: 80 mg via INTRAVENOUS

## 2023-01-05 MED ORDER — ONDANSETRON HCL 4 MG/2ML IJ SOLN: 4.0000 mg | Freq: Once | INTRAMUSCULAR | Status: DC | PRN

## 2023-01-05 MED ORDER — SODIUM CHLORIDE 0.9 % IR SOLN
Status: DC | PRN
  Administered 2023-01-05: 1000 mL

## 2023-01-05 MED ORDER — POTASSIUM CHLORIDE IN NACL 20-0.9 MEQ/L-% IV SOLN
INTRAVENOUS | Status: DC
  Filled 2023-01-05 (×5): qty 1000

## 2023-01-05 MED ORDER — MAGNESIUM SULFATE IN D5W 1-5 GM/100ML-% IV SOLN
1.0000 g | Freq: Once | INTRAVENOUS | Status: AC
  Administered 2023-01-05: 1 g via INTRAVENOUS
  Filled 2023-01-05: qty 100

## 2023-01-05 MED ORDER — VANCOMYCIN HCL IN DEXTROSE 1-5 GM/200ML-% IV SOLN
1000.0000 mg | Freq: Once | INTRAVENOUS | Status: AC
  Administered 2023-01-05: 1000 mg via INTRAVENOUS
  Filled 2023-01-05: qty 200

## 2023-01-05 SURGICAL SUPPLY — 20 items
BAG DRAIN SIEMENS DORNER NS (MISCELLANEOUS) ×1 IMPLANT
BRUSH SCRUB EZ 1% IODOPHOR (MISCELLANEOUS) ×1 IMPLANT
CATH FOL 2WAY LX 16X30 (CATHETERS) IMPLANT
CATH URETL OPEN 5X70 (CATHETERS) ×1 IMPLANT
DRAPE UTILITY 15X26 TOWEL STRL (DRAPES) ×1 IMPLANT
GLOVE BIO SURGEON STRL SZ 6.5 (GLOVE) ×1 IMPLANT
GOWN STRL REUS W/ TWL XL LVL3 (GOWN DISPOSABLE) ×1 IMPLANT
GOWN STRL REUS W/TWL XL LVL3 (GOWN DISPOSABLE) ×1
GUIDEWIRE STR DUAL SENSOR (WIRE) ×1 IMPLANT
IV NS IRRIG 3000ML ARTHROMATIC (IV SOLUTION) ×1 IMPLANT
KIT TURNOVER CYSTO (KITS) ×1 IMPLANT
MANIFOLD NEPTUNE II (INSTRUMENTS) ×1 IMPLANT
PACK CYSTO AR (MISCELLANEOUS) ×1 IMPLANT
SET CYSTO W/LG BORE CLAMP LF (SET/KITS/TRAYS/PACK) ×1 IMPLANT
STENT URET 6FRX24 CONTOUR (STENTS) ×1 IMPLANT
STENT URET 6FRX26 CONTOUR (STENTS) ×1 IMPLANT
SURGILUBE 2OZ TUBE FLIPTOP (MISCELLANEOUS) ×1 IMPLANT
TRAP FLUID SMOKE EVACUATOR (MISCELLANEOUS) ×1 IMPLANT
WATER STERILE IRR 1000ML POUR (IV SOLUTION) ×1 IMPLANT
WATER STERILE IRR 500ML POUR (IV SOLUTION) ×1 IMPLANT

## 2023-01-05 NOTE — Consult Note (Signed)
Urology Consult  I have been asked to see the patient by Dr. Jari Pigg, for evaluation and management of sepsis, left UPJ stone.  Chief Complaint: Fever  History of Present Illness: Johnny Rice is a 77 y.o. year old male with PMH dementia, CVA with left-sided deficits who presented to the ED from [place] this morning with reports of fever.  Other symptoms difficult to discern due to baseline dementia.  He was febrile to 40.1 C and tachycardic on arrival.  He has been normotensive.  Admission labs notable for lactate 1.8; creatinine 1.64 (baseline 1.15); WBC count 18.7; UA pending.  Blood cultures pending.  Code sepsis initiated, on antibiotics as below.  CTAP without contrast revealed an obstructing 7 mm left UPJ stone with perinephric stranding.  He is asleep on my arrival today and unarousable.   Anti-infectives (From admission, onward)    Start     Dose/Rate Route Frequency Ordered Stop   01/05/23 0915  ceFEPIme (MAXIPIME) 2 g in sodium chloride 0.9 % 100 mL IVPB        2 g 200 mL/hr over 30 Minutes Intravenous  Once 01/05/23 0911 01/05/23 0954   01/05/23 0915  metroNIDAZOLE (FLAGYL) IVPB 500 mg        500 mg 100 mL/hr over 60 Minutes Intravenous  Once 01/05/23 0911 01/05/23 1055   01/05/23 0915  vancomycin (VANCOCIN) IVPB 1000 mg/200 mL premix        1,000 mg 200 mL/hr over 60 Minutes Intravenous  Once 01/05/23 0911 01/05/23 1159        Past Medical History:  Diagnosis Date   Hypertension    Stroke [facility])     Past Surgical History:  Procedure Laterality Date   APPENDECTOMY      Home Medications:  No outpatient medications have been marked as taking for the 01/05/23 encounter [facility] Encounter).    Allergies: No Known Allergies  Family History  Problem Relation Age of Onset   Heart disease Neg Hx     Social History:  reports that he has never smoked. He has never been exposed to tobacco smoke. He has never used smokeless tobacco. He reports that he  does not currently use alcohol. He reports that he does not currently use drugs.  ROS: A complete review of systems was performed.  All systems are negative except for pertinent findings as noted.  Physical Exam:  Vital signs in last 24 hours: Temp:  [98.6 F (37 C)-104.1 F (40.1 C)] 98.6 F (37 C) (03/18 1059) Pulse Rate:  [75-104] 75 (03/18 1100) Resp:  [20-21] 21 (03/18 1100) BP: (120-141)/(66-97) 120/66 (03/18 1100) SpO2:  [90 %-91 %] 90 % (03/18 1100) Constitutional:  Asleep HEENT: [place] AT, moist mucus membranes Respiratory: Normal respiratory effort Skin: No rashes, bruises or suspicious lesions Neurologic: Unable to assess Psychiatric: Normal mood and affect  Laboratory Data:  Recent Labs    01/05/23 0912  WBC 18.7*  HGB 12.8*  HCT 37.0*   Recent Labs    01/05/23 0912  NA 139  K 3.3*  CL 108  CO2 20*  GLUCOSE 155*  BUN 25*  CREATININE 1.64*  CALCIUM 8.8*   Recent Labs    01/05/23 0912  INR 1.3*   Urinalysis    Component Value Date/Time   COLORURINE AMBER (A) 11/16/2022 1626   APPEARANCEUR CLOUDY (A) 11/16/2022 1626   APPEARANCEUR Cloudy (A) 05/22/2021 1014   LABSPEC 1.018 11/16/2022 1626   PHURINE 8.0 11/16/2022 1626   GLUCOSEU  NEGATIVE 11/16/2022 1626   HGBUR NEGATIVE 11/16/2022 1626   BILIRUBINUR NEGATIVE 11/16/2022 1626   BILIRUBINUR Negative 05/22/2021 1014   KETONESUR 5 (A) 11/16/2022 1626   PROTEINUR 100 (A) 11/16/2022 1626   NITRITE NEGATIVE 11/16/2022 1626   LEUKOCYTESUR LARGE (A) 11/16/2022 1626   Results for orders placed or performed during the hospital encounter of 01/05/23  Resp panel by RT-PCR (RSV, Flu A&B, Covid) Anterior Nasal Swab     Status: None   Collection Time: 01/05/23  9:10 AM   Specimen: Anterior Nasal Swab  Result Value Ref Range Status   SARS Coronavirus 2 by RT PCR NEGATIVE NEGATIVE Final    Comment: (NOTE) SARS-CoV-2 target nucleic acids are NOT DETECTED.  The SARS-CoV-2 RNA is generally detectable in upper  respiratory specimens during the acute phase of infection. The lowest concentration of SARS-CoV-2 viral copies this assay can detect is 138 copies/mL. A negative result does not preclude SARS-Cov-2 infection and should not be used as the sole basis for treatment or other patient management decisions. A negative result may occur with  improper specimen collection/handling, submission of specimen other than nasopharyngeal swab, presence of viral mutation(s) within the areas targeted by this assay, and inadequate number of viral copies(<138 copies/mL). A negative result must be combined with clinical observations, patient history, and epidemiological information. The expected result is Negative.  Fact Sheet for Patients:  EntrepreneurPulse.com.au  Fact Sheet for Healthcare Providers:  IncredibleEmployment.be  This test is no t yet approved or cleared by the [place] FDA and  has been authorized for detection and/or diagnosis of SARS-CoV-2 by FDA under an Emergency Use Authorization (EUA). This EUA will remain  in effect (meaning this test can be used) for the duration of the COVID-19 declaration under Section 564(b)(1) of the Act, 21 U.S.C.section 360bbb-3(b)(1), unless the authorization is terminated  or revoked sooner.       Influenza A by PCR NEGATIVE NEGATIVE Final   Influenza B by PCR NEGATIVE NEGATIVE Final    Comment: (NOTE) The Xpert Xpress SARS-CoV-2/FLU/RSV plus assay is intended as an aid in the diagnosis of influenza from Nasopharyngeal swab specimens and should not be used as a sole basis for treatment. Nasal washings and aspirates are unacceptable for Xpert Xpress SARS-CoV-2/FLU/RSV testing.  Fact Sheet for Patients: EntrepreneurPulse.com.au  Fact Sheet for Healthcare Providers: IncredibleEmployment.be  This test is not yet approved or cleared by the [place] FDA and has been  authorized for detection and/or diagnosis of SARS-CoV-2 by FDA under an Emergency Use Authorization (EUA). This EUA will remain in effect (meaning this test can be used) for the duration of the COVID-19 declaration under Section 564(b)(1) of the Act, 21 U.S.C. section 360bbb-3(b)(1), unless the authorization is terminated or revoked.     Resp Syncytial Virus by PCR NEGATIVE NEGATIVE Final    Comment: (NOTE) Fact Sheet for Patients: EntrepreneurPulse.com.au  Fact Sheet for Healthcare Providers: IncredibleEmployment.be  This test is not yet approved or cleared by the [place] FDA and has been authorized for detection and/or diagnosis of SARS-CoV-2 by FDA under an Emergency Use Authorization (EUA). This EUA will remain in effect (meaning this test can be used) for the duration of the COVID-19 declaration under Section 564(b)(1) of the Act, 21 U.S.C. section 360bbb-3(b)(1), unless the authorization is terminated or revoked.  Performed at [facility], [address], [place], [place] [address]     Radiologic Imaging: CT CHEST ABDOMEN PELVIS WO CONTRAST  Result Date: 01/05/2023 CLINICAL DATA:  Sepsis EXAM: CT CHEST, ABDOMEN AND PELVIS WITHOUT CONTRAST TECHNIQUE: Multidetector CT imaging of the chest, abdomen and pelvis was performed following the standard protocol without IV contrast. RADIATION DOSE REDUCTION: This exam was performed according to the departmental dose-optimization program which includes automated exposure control, adjustment of the mA and/or kV according to patient size and/or use of iterative reconstruction technique. COMPARISON:  Same day chest radiograph, CT abdomen/pelvis 10/05/2021 FINDINGS: CT CHEST FINDINGS Cardiovascular: The heart is at the upper limits of normal for size. There is no pericardial effusion there is mild calcified plaque in the imaged aortic arch. Mediastinum/Nodes: No thyroid tissue is identified.  The esophagus is grossly unremarkable. There is no mediastinal or axillary lymphadenopathy. There is no bulky hilar adenopathy, within the confines of noncontrast technique. Lungs/Pleura: The trachea and central airways are patent. Lung volumes are low. Reticular opacities in the lung bases, lingula, and subpleural right upper lobe likely reflects scarring. There is a trace right pleural effusion. There is no left effusion. There is no pneumothorax There are no suspicious nodules. Musculoskeletal: There is no acute osseous abnormality or suspicious osseous lesion. CT ABDOMEN PELVIS FINDINGS Hepatobiliary: The liver and gallbladder are unremarkable. There is no biliary ductal dilatation. Pancreas: Unremarkable. Spleen: Unremarkable. Adrenals/Urinary Tract: The adrenals are unremarkable. There is a 7 mm stone at the left UPJ with upstream hydronephrosis and asymmetric perinephric stranding. Superimposed infection is not excluded. There is no organized perinephric fluid collection. There are no definite suspicious renal lesions, within the confines of noncontrast technique. No other stones are seen in either kidney or along the course of either ureter. There is no hydronephrosis or hydroureter on the right. The bladder is unremarkable. Stomach/Bowel: The stomach is unremarkable. There is no evidence of bowel obstruction. There is no abnormal bowel wall thickening or inflammatory change. There is colonic diverticulosis without evidence of acute diverticulitis. The appendix is not identified. Vascular/Lymphatic: There is mild calcified plaque in the nonaneurysmal abdominal aorta. There is no abdominal or pelvic lymphadenopathy. Reproductive: The prostate and seminal vesicles are unremarkable. Other: There is no ascites or free air. There is a small fat containing left inguinal hernia. Musculoskeletal: There is no acute osseous abnormality or suspicious osseous lesion. There is multilevel degenerative change of the lumbar  spine, most advanced at L3-L4 and L4-L5. IMPRESSION: 1. 7 mm obstructing stone at the left UPJ with upstream hydronephrosis and asymmetric perinephric stranding. Superimposed infection not excluded 2. Trace right pleural effusion. No other acute finding in the chest. Electronically Signed   By: Valetta Mole M.D.   On: 01/05/2023 11:01   CT HEAD WO CONTRAST (5MM)  Result Date: 01/05/2023 CLINICAL DATA:  Minor head trauma EXAM: CT HEAD WITHOUT CONTRAST TECHNIQUE: Contiguous axial images were obtained from the base of the skull through the vertex without intravenous contrast. RADIATION DOSE REDUCTION: This exam was performed according to the departmental dose-optimization program which includes automated exposure control, adjustment of the mA and/or kV according to patient size and/or use of iterative reconstruction technique. COMPARISON:  07/09/2022 FINDINGS: Brain: Remote right PCA territory infarct affecting temporal and occipital cortex and extending into the parietal lobe. Cerebral volume loss with ventriculomegaly that is unchanged. No evidence of acute infarct, hemorrhage, obstructive hydrocephalus, or mass. Vascular: No hyperdense vessel or unexpected calcification. Skull: Negative Sinuses/Orbits: Changes of left mastoidectomy with partial opacification. Presumably related gold weight in the left orbit. IMPRESSION: 1. No evidence of intracranial injury. 2. 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 Electronically Signed   By:  Jorje Guild M.D.   On: 01/05/2023 10:48   DG Chest Port 1 View  Result Date: 01/05/2023 CLINICAL DATA:  Sepsis EXAM: PORTABLE CHEST 1 VIEW COMPARISON:  11/16/2022 FINDINGS: Stable cardiomediastinal contours. Low lung volumes with streaky bibasilar opacities. No pleural effusion. No pneumothorax. IMPRESSION: Low lung volumes with streaky bibasilar opacities, likely atelectasis. Electronically Signed   By: Davina Poke D.O.   On: 01/05/2023 09:57    Assessment &  Plan:  77 year old male with PMH dementia and CVA with left-sided deficits who presented to the ED from his facility with fever.  Initial workup notable for an obstructing 7 mm left UPJ stone and concern for sepsis of likely urinary source.  We recommend proceeding to the OR today for left ureteral stent placement for maximum urinary decompression and source control of his urinary infection.  Per nursing, daughter has been contacted and is amenable with this plan.  I have placed orders for informed consent.  Patient is unable to consent for himself due to somnolence and baseline dementia.  Recommendations: -NPO pending procedure -Continue antibiotics and supportive care -Left ureteral stent placement with Dr. Erlene Quan -Obtain urine culture when urine specimen is available, possibly intraop  Thank you for involving me in this patient's care, I will continue to follow along.  Debroah Loop, PA-C 01/05/2023 11:58 AM

## 2023-01-05 NOTE — ED Notes (Signed)
OR tx at bedside to tx pt.

## 2023-01-05 NOTE — Assessment & Plan Note (Signed)
Noted e at the left UPJ with upstream hydronephrosis and asymmetric perinephric stranding w/ concern for infected kidney stone w/ intractable nausea and vomiting and sepsis on presentation  Pending urological intervention w/ plan for ureteral stent and urine culture IV rocephin for infectious coverage Antiemetics  Pain control  Follow up further urology recommendations.

## 2023-01-05 NOTE — Progress Notes (Signed)
PHARMACY -  BRIEF ANTIBIOTIC NOTE   Pharmacy has received consults for vancomycin and cefepime from an ED provider.  The patient's profile has been reviewed for ht/wt/allergies/indication/available labs.    One time orders placed for vancomycin 1,000 mg x 1 and cefepime 2 grams x 1  Further antibiotics/pharmacy consults should be ordered by admitting physician if indicated.                       Thank you,  Glean Salvo, PharmD, BCPS Clinical Pharmacist  01/05/2023 9:19 AM

## 2023-01-05 NOTE — ED Triage Notes (Signed)
[facility] reports pt coming from the [facility] for [facility]. EMS temp 104 axillary upon arrival. Pt has hx of previous stroke with deficits present.

## 2023-01-05 NOTE — H&P (Signed)
History and Physical    Patient: Johnny Rice [id number] DOB: Feb 05, 1946 DOA: 01/05/2023 DOS: the patient was seen and examined on 01/05/2023 PCP: [facility]  Patient coming from: ALF/ILF  Chief Complaint:  Chief Complaint  Patient presents with   Fever   HPI: Johnny Rice is a 77 y.o. male with medical history significant of hypertension, CVA, hyperlipidemia, hypothyroidism, BPH, cognitive impairment presenting with sepsis, obstructive uropathy, encephalopathy.  Limited history as the patient is minimally verbal at present/sleeping.  Is not responding to questioning at present.  Per report, patient with intractable nausea and vomiting at skilled nursing facility.  Was noted to have a temperature 104 while at facility.  No reports of abdominal pain diarrhea.  No reports of chest pain or cough.  No reports of recent falls.  Was unable to take his medications in the morning. Presented to the ER Tmax of 104, heart rate 104, blood pressure 140s over 70s.  Satting well on room air though transition to 2 L nasal cannula to keep O2 sats greater than 98%.  White count 18.7, hemoglobin 12.8, creatinine 1.64.  Potassium 3.3.  T. bili 2.7.  Chest x-ray, CT head grossly stable.  CT abdomen pelvis showed 7 mm obstructing stone at the left UPJ with upstream hydronephrosis and perinephric stranding with concern for superimposed infection.  Dr. Erlene Quan with urology consulted with plan for operative placement of ureteral stent. Review of Systems: As mentioned in the history of present illness. All other systems reviewed and are negative. Past Medical History:  Diagnosis Date   Hypertension    Stroke [facility])    Past Surgical History:  Procedure Laterality Date   APPENDECTOMY     Social History:  reports that he has never smoked. He has never been exposed to tobacco smoke. He has never used smokeless tobacco. He reports that he does not currently use alcohol. He reports that he does not  currently use drugs.  No Known Allergies  Family History  Problem Relation Age of Onset   Heart disease Neg Hx     Prior to Admission medications   Medication Sig Start Date End Date Taking? Authorizing Provider  alfuzosin (UROXATRAL) 10 MG 24 hr tablet Take 10 mg by mouth in the morning.   Yes [provider]  alum & mag hydroxide-simeth (MAALOX/MYLANTA) 200-200-20 MG/5ML suspension Take 30 mLs by mouth 4 (four) times daily as needed (heartburn or indigestion).   Yes [provider]  ARIPiprazole (ABILIFY) 15 MG tablet Take 15 mg by mouth daily. 10/27/22  Yes [provider]  aspirin 81 MG chewable tablet Chew 81 mg by mouth daily.   Yes [provider]  atorvastatin (LIPITOR) 20 MG tablet Take 20 mg by mouth at bedtime.   Yes [provider]  barrier cream (NON-SPECIFIED) CREA Apply 1 application topically as needed (post-toileting skin protection).   Yes [provider]  clonazePAM (KLONOPIN) 1 MG tablet Take 1 mg by mouth 3 (three) times daily as needed. 04/17/22  Yes [provider]  docusate sodium (COLACE) 100 MG capsule Take 1 capsule (100 mg total) by mouth daily. 05/22/21  Yes Billey Co, MD  guaifenesin (ROBITUSSIN) 100 MG/5ML syrup Take 300 mg by mouth every 6 (six) hours as needed for cough.   Yes [provider]  hydrocortisone cream 0.5 % Apply 1 application topically 2 (two) times daily. (Apply to left temple and under left eye)   Yes [provider]  hydroxypropyl methylcellulose / hypromellose (  ISOPTO TEARS / GONIOVISC) 2.5 % ophthalmic solution Place 1 drop into both eyes 3 (three) times daily as needed for dry eyes. 05/19/22  Yes Blake Divine, MD  hydrOXYzine (VISTARIL) 50 MG capsule Take 50 mg by mouth every 6 (six) hours as needed (agitation).   Yes [provider]  lidocaine (XYLOCAINE) 2 % solution Use as directed 10 mLs in the mouth or throat every 4 (four) hours as needed for  mouth pain. Swish, gargle, and spit 12/22/22  Yes Cuthriell, Charline Bills, PA-C  liothyronine (CYTOMEL) 25 MCG tablet Take 25 mcg by mouth daily.   Yes [provider]  lisinopril (ZESTRIL) 40 MG tablet Take 40 mg by mouth daily.   Yes [provider]  loperamide (IMODIUM) 2 MG capsule Take 4 mg by mouth every 4 (four) hours as needed for diarrhea or loose stools. (Max 8 doses in 24 hours)   Yes [provider]  magnesium hydroxide (MILK OF MAGNESIA) 400 MG/5ML suspension Take 30 mLs by mouth daily as needed for mild constipation.   Yes [provider]  meloxicam (MOBIC) 15 MG tablet Take 15 mg by mouth daily.   Yes [provider]  metoprolol tartrate (LOPRESSOR) 25 MG tablet Take 25 mg by mouth 2 (two) times daily. 07/08/21  Yes [provider]  neomycin-bacitracin-polymyxin (NEOSPORIN) [phone] ointment Apply 1 application topically daily as needed (wound care). (Apply to right big toe)   Yes [provider]  PAIN RELIEF EXTRA STRENGTH 500 MG tablet Take 1,000 mg by mouth every 4 (four) hours as needed. 10/30/22  Yes [provider]  polyethylene glycol powder (GLYCOLAX/MIRALAX) 17 GM/SCOOP powder Take 17 g by mouth every other day.   Yes [provider]  ARIPiprazole (ABILIFY) 10 MG tablet Take 1 tablet (10 mg total) by mouth daily. Patient not taking: Reported on 01/05/2023 04/27/22   Lavina Hamman, MD  cephALEXin (KEFLEX) 500 MG capsule Take 1 capsule (500 mg total) by mouth 2 (two) times daily. Patient not taking: Reported on 01/05/2023 11/16/22   Carrie Mew, MD  ergocalciferol (VITAMIN D2) 1.25 MG (50000 UT) capsule Take 50,000 Units by mouth once a week.    [provider]  polyvinyl alcohol (LIQUIFILM TEARS) 1.4 % ophthalmic solution Place 2 drops into both eyes daily.    [provider]  traZODone (DESYREL) 100 MG tablet Take 100 mg by mouth at bedtime. Patient not taking: Reported on  01/05/2023    [provider]    Physical Exam: Vitals:   01/05/23 1059 01/05/23 1100 01/05/23 1200 01/05/23 1259  BP:  120/66 115/69 127/72  Pulse:  75 63 (!) 50  Resp:  (!) 21 17 16   Temp: 98.6 F (37 C)   99.4 F (37.4 C)  TempSrc: Oral   Axillary  SpO2:  90% 98% 99%  Weight:    76 kg  Height:    5\' 7"  (1.702 m)   Physical Exam Constitutional:      General: He is not in acute distress.    Comments: Patient sleeping, not answering questions  HENT:     Head: Normocephalic.     Mouth/Throat:     Mouth: Mucous membranes are dry.  Eyes:     Pupils: Pupils are equal, round, and reactive to light.  Cardiovascular:     Rate and Rhythm: Normal rate and regular rhythm.  Pulmonary:     Effort: Pulmonary effort is normal.  Abdominal:     General: Bowel sounds  are normal.  Musculoskeletal:        General: Normal range of motion.     Cervical back: Normal range of motion.  Skin:    General: Skin is dry.  Neurological:     General: No focal deficit present.  Psychiatric:        Mood and Affect: Mood normal.     Data Reviewed:  There are no new results to review at this time. DG OR UROLOGY CYSTO IMAGE ([place]) There is no interpretation for this exam.    This order is for images obtained during a surgical procedure.  Please See  "Surgeries" Tab for more information regarding the procedure. CT CHEST ABDOMEN PELVIS WO CONTRAST CLINICAL DATA:  Sepsis  EXAM: CT CHEST, ABDOMEN AND PELVIS WITHOUT CONTRAST  TECHNIQUE: Multidetector CT imaging of the chest, abdomen and pelvis was performed following the standard protocol without IV contrast.  RADIATION DOSE REDUCTION: This exam was performed according to the departmental dose-optimization program which includes automated exposure control, adjustment of the mA and/or kV according to patient size and/or use of iterative reconstruction technique.  COMPARISON:  Same day chest radiograph, CT abdomen/pelvis  10/05/2021  FINDINGS: CT CHEST FINDINGS  Cardiovascular: The heart is at the upper limits of normal for size. There is no pericardial effusion there is mild calcified plaque in the imaged aortic arch.  Mediastinum/Nodes: No thyroid tissue is identified. The esophagus is grossly unremarkable. There is no mediastinal or axillary lymphadenopathy. There is no bulky hilar adenopathy, within the confines of noncontrast technique.  Lungs/Pleura: The trachea and central airways are patent.  Lung volumes are low. Reticular opacities in the lung bases, lingula, and subpleural right upper lobe likely reflects scarring. There is a trace right pleural effusion. There is no left effusion. There is no pneumothorax  There are no suspicious nodules.  Musculoskeletal: There is no acute osseous abnormality or suspicious osseous lesion.  CT ABDOMEN PELVIS FINDINGS  Hepatobiliary: The liver and gallbladder are unremarkable. There is no biliary ductal dilatation.  Pancreas: Unremarkable.  Spleen: Unremarkable.  Adrenals/Urinary Tract: The adrenals are unremarkable.  There is a 7 mm stone at the left UPJ with upstream hydronephrosis and asymmetric perinephric stranding. Superimposed infection is not excluded. There is no organized perinephric fluid collection. There are no definite suspicious renal lesions, within the confines of noncontrast technique. No other stones are seen in either kidney or along the course of either ureter. There is no hydronephrosis or hydroureter on the right.  The bladder is unremarkable.  Stomach/Bowel: The stomach is unremarkable. There is no evidence of bowel obstruction. There is no abnormal bowel wall thickening or inflammatory change. There is colonic diverticulosis without evidence of acute diverticulitis. The appendix is not identified.  Vascular/Lymphatic: There is mild calcified plaque in the nonaneurysmal abdominal aorta. There is no abdominal or  pelvic lymphadenopathy.  Reproductive: The prostate and seminal vesicles are unremarkable.  Other: There is no ascites or free air. There is a small fat containing left inguinal hernia.  Musculoskeletal: There is no acute osseous abnormality or suspicious osseous lesion. There is multilevel degenerative change of the lumbar spine, most advanced at L3-L4 and L4-L5.  IMPRESSION: 1. 7 mm obstructing stone at the left UPJ with upstream hydronephrosis and asymmetric perinephric stranding. Superimposed infection not excluded 2. Trace right pleural effusion. No other acute finding in the chest.  Electronically Signed   By: Valetta Mole M.D.   On: 01/05/2023 11:01 CT HEAD WO CONTRAST (5MM) CLINICAL DATA:  Minor head trauma  EXAM: CT HEAD WITHOUT CONTRAST  TECHNIQUE: Contiguous axial images were obtained from the base of the skull through the vertex without intravenous contrast.  RADIATION DOSE REDUCTION: This exam was performed according to the departmental dose-optimization program which includes automated exposure control, adjustment of the mA and/or kV according to patient size and/or use of iterative reconstruction technique.  COMPARISON:  07/09/2022  FINDINGS: Brain: Remote right PCA territory infarct affecting temporal and occipital cortex and extending into the parietal lobe. Cerebral volume loss with ventriculomegaly that is unchanged. No evidence of acute infarct, hemorrhage, obstructive hydrocephalus, or mass.  Vascular: No hyperdense vessel or unexpected calcification.  Skull: Negative  Sinuses/Orbits: Changes of left mastoidectomy with partial opacification. Presumably related gold weight in the left orbit.  IMPRESSION: 1. No evidence of intracranial injury. 2. 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  Electronically Signed   By: Jorje Guild M.D.   On: 01/05/2023 10:48 DG Chest Port 1 View CLINICAL DATA:  Sepsis  EXAM: PORTABLE  CHEST 1 VIEW  COMPARISON:  11/16/2022  FINDINGS: Stable cardiomediastinal contours. Low lung volumes with streaky bibasilar opacities. No pleural effusion. No pneumothorax.  IMPRESSION: Low lung volumes with streaky bibasilar opacities, likely atelectasis.  Electronically Signed   By: Davina Poke D.O.   On: 01/05/2023 09:57  Lab Results  Component Value Date   WBC 18.7 (H) 01/05/2023   HGB 12.8 (L) 01/05/2023   HCT 37.0 (L) 01/05/2023   MCV 85.1 01/05/2023   PLT 248 [id number]   Last metabolic panel Lab Results  Component Value Date   GLUCOSE 155 (H) 01/05/2023   NA 139 01/05/2023   K 3.3 (L) 01/05/2023   CL 108 01/05/2023   CO2 20 (L) 01/05/2023   BUN 25 (H) 01/05/2023   CREATININE 1.64 (H) 01/05/2023   GFRNONAA 44 (L) 01/05/2023   CALCIUM 8.8 (L) 01/05/2023   PROT 7.3 01/05/2023   ALBUMIN 3.7 01/05/2023   BILITOT 2.7 (H) 01/05/2023   ALKPHOS 162 (H) 01/05/2023   AST 25 01/05/2023   ALT 13 01/05/2023   ANIONGAP 11 01/05/2023    Assessment and Plan: * Obstructive uropathy Noted e at the left UPJ with upstream hydronephrosis and asymmetric perinephric stranding w/ concern for infected kidney stone w/ intractable nausea and vomiting and sepsis on presentation  Pending urological intervention w/ plan for ureteral stent and urine culture IV rocephin for infectious coverage Antiemetics  Pain control  Follow up further urology recommendations.   Sepsis ([place]) Meet sepsis criteria by temperature 104, heart rate 100s, white count 19 Noted urinary source with infected left kidney stone and ureteral obstruction Status post cefepime, vancomycin and Flagyl in the ER for broad-spectrum coverage Transitioning to IV Rocephin Lactate reassuring at 1.8-1.1 Panculture Continue with maintenance IV fluid hydration Follow  Encephalopathy Toxic metabolic encephalopathy in setting of active sepsis associated with obstructive uropathy and infected kidney stone Patient  minimally verbal at baseline CT head and chest are grossly stable Noted left obstructive uropathy with 7 mm stone and perinephric stranding Pending ureteral stent placement IV Rocephin for infectious coverage Monitor for now as general baseline is unclear at present Reassess chronic problems as well as medications once patient is cleared postoperatively  Essential hypertension BP stable Titrate home regimen  AKI (acute kidney injury) ([place]) Creatinine 1.64 today with baseline creatinine around 1 Suspect secondary to left-sided obstructive uropathy Pending ureteral stent placement Started on IV fluid hydration Hold nephrotoxic agents Check FENa Follow  Advance Care Planning:   Code Status: Full Code   Consults: Urology- Erlene Quan   Family Communication: No family at the bedside   Severity of Illness: The appropriate patient status for this patient is INPATIENT. Inpatient status is judged to be reasonable and necessary in order to provide the required intensity of service to ensure the patient's safety. The patient's presenting symptoms, physical exam findings, and initial radiographic and laboratory data in the context of their chronic comorbidities is felt to place them at high risk for further clinical deterioration. Furthermore, it is not anticipated that the patient will be medically stable for discharge from the hospital within 2 midnights of admission.   * I certify that at the point of admission it is my clinical judgment that the patient will require inpatient hospital care spanning beyond 2 midnights from the point of admission due to high intensity of service, high risk for further deterioration and high frequency of surveillance required.*  Author: Deneise Lever, MD 01/05/2023 1:52 PM  For on call review [email or web address].

## 2023-01-05 NOTE — Anesthesia Procedure Notes (Signed)
Procedure Name: Intubation Date/Time: 01/05/2023 1:46 PM  Performed by: Jacqualin Combes, CRNAPre-anesthesia Checklist: Patient identified, Emergency Drugs available, Suction available and Patient being monitored Patient Re-evaluated:Patient Re-evaluated prior to induction Oxygen Delivery Method: Circle system utilized Preoxygenation: Pre-oxygenation with 100% oxygen Induction Type: IV induction Ventilation: Mask ventilation without difficulty Laryngoscope Size: Mac and 4 Grade View: Grade I Tube type: Oral Number of attempts: 1 Airway Equipment and Method: Stylet and Oral airway Placement Confirmation: ETT inserted through vocal cords under direct vision, positive ETCO2 and breath sounds checked- equal and bilateral Secured at: 20 cm Tube secured with: Tape Dental Injury: Teeth and Oropharynx as per pre-operative assessment

## 2023-01-05 NOTE — Assessment & Plan Note (Signed)
Creatinine 1.64 today with baseline creatinine around 1 Suspect secondary to left-sided obstructive uropathy Pending ureteral stent placement Started on IV fluid hydration Hold nephrotoxic agents Check FENa Follow

## 2023-01-05 NOTE — Transfer of Care (Signed)
Immediate Anesthesia Transfer of Care Note  Patient: Johnny Rice  Procedure(s) Performed: CYSTOSCOPY WITH RETROGRADE PYELOGRAM/URETERAL STENT PLACEMENT (Left: Ureter)  Patient Location: PACU  Anesthesia Type:General  Level of Consciousness: drowsy, lethargic, and responds to stimulation  Airway & Oxygen Therapy: Patient Spontanous Breathing and Patient connected to face mask oxygen  Post-op Assessment: Report given to RN and Post -op Vital signs reviewed and stable  Post vital signs: stable  Last Vitals:  Vitals Value Taken Time  BP 133/72 01/05/23 1418  Temp    Pulse 68 01/05/23 1424  Resp 13 01/05/23 1424  SpO2 100 % 01/05/23 1424  Vitals shown include unvalidated device data.  Last Pain:  Vitals:   01/05/23 1259  TempSrc: Axillary         Complications: No notable events documented.

## 2023-01-05 NOTE — Sepsis Progress Note (Signed)
Sepsis protocol monitored by eLink ?

## 2023-01-05 NOTE — ED Provider Notes (Signed)
[facility] Provider Note    Event Date/Time   First MD Initiated Contact with Patient 01/05/23 [phone]     (approximate)   History   No chief complaint on file.   HPI  Johnny Rice is a 77 y.o. male with history of dementia, previous CVA with left-sided deficits who comes in with fever.  Patient reportedly seemed more tired this morning and vomited.  Unable to get his medications that he typically takes in the morning.  They called EMS and he was noted to have a fever of 104.  No other else sick at the facility with COVID, flu.  No known falls in the last day or 2.  They report that he is at his  mental baseline.     Physical Exam   Triage Vital Signs: ED Triage Vitals  Enc Vitals Group     BP      Pulse      Resp      Temp      Temp src      SpO2      Weight      Height      Head Circumference      Peak Flow      Pain Score      Pain Loc      Pain Edu?      Excl. in [place]?     Most recent vital signs: Vitals:   01/05/23 0912  BP: (!) 128/97  Pulse: (!) 104  Resp: 20  Temp: (!) 104.1 F (40.1 C)  SpO2: 91%     General: Awake, no distress.  CV:  Good peripheral perfusion.  Resp:  Normal effort.  Abd:  No distention.  Other:  Patient has some baseline dementia and difficult to tell where his pain is at.   ED Results / Procedures / Treatments   Labs (all labs ordered are listed, but only abnormal results are displayed) Labs Reviewed  COMPREHENSIVE METABOLIC PANEL - Abnormal; Notable for the following components:      Result Value   Potassium 3.3 (*)    CO2 20 (*)    Glucose, Bld 155 (*)    BUN 25 (*)    Creatinine, Ser 1.64 (*)    Calcium 8.8 (*)    Alkaline Phosphatase 162 (*)    Total Bilirubin 2.7 (*)    GFR, Estimated 44 (*)    All other components within normal limits  CBC WITH DIFFERENTIAL/PLATELET - Abnormal; Notable for the following components:   WBC 18.7 (*)    Hemoglobin 12.8 (*)    HCT 37.0 (*)    Neutro Abs  16.6 (*)    Lymphs Abs 0.6 (*)    Monocytes Absolute 1.4 (*)    Abs Immature Granulocytes 0.13 (*)    All other components within normal limits  PROTIME-INR - Abnormal; Notable for the following components:   Prothrombin Time 15.7 (*)    INR 1.3 (*)    All other components within normal limits  APTT - Abnormal; Notable for the following components:   aPTT 38 (*)    All other components within normal limits  RESP PANEL BY RT-PCR (RSV, FLU A&B, COVID)  [id number]  CULTURE, BLOOD (ROUTINE X 2)  CULTURE, BLOOD (ROUTINE X 2)  LACTIC ACID, PLASMA  LACTIC ACID, PLASMA  URINALYSIS, W/ REFLEX TO CULTURE (INFECTION SUSPECTED)     EKG  My interpretation of EKG:  Sinus tachycardia rate of 103 without  any ST elevations or T wave inversions, normal intervals  RADIOLOGY I have reviewed the xray personally and interpreted and no evidence of any pneumonia   PROCEDURES:  Critical Care performed: Yes, see critical care procedure note(s)  .1-3 Lead EKG Interpretation  Performed by: Vanessa [place], MD Authorized by: Vanessa [place], MD     Interpretation: abnormal     ECG rate:  104   ECG rate assessment: tachycardic     Rhythm: sinus tachycardia     Ectopy: none     Conduction: normal   .Critical Care  Performed by: Vanessa [place], MD Authorized by: Vanessa [place], MD   Critical care provider statement:    Critical care time (minutes):  30   Critical care was necessary to treat or prevent imminent or life-threatening deterioration of the following conditions:  Sepsis   Critical care was time spent personally by me on the following activities:  Development of treatment plan with patient or surrogate, discussions with consultants, evaluation of patient's response to treatment, examination of patient, ordering and review of laboratory studies, ordering and review of radiographic studies, ordering and performing treatments and interventions, pulse oximetry, re-evaluation of patient's condition and  review of old charts    MEDICATIONS ORDERED IN ED: Medications  vancomycin (VANCOCIN) IVPB 1000 mg/200 mL premix (1,000 mg Intravenous New Bag/Given 01/05/23 1059)  acetaminophen (TYLENOL) suppository 650 mg (650 mg Rectal Given 01/05/23 0929)  ceFEPIme (MAXIPIME) 2 g in sodium chloride 0.9 % 100 mL IVPB (0 g Intravenous Stopped 01/05/23 0954)  metroNIDAZOLE (FLAGYL) IVPB 500 mg (0 mg Intravenous Stopped 01/05/23 1055)  lactated ringers bolus 1,000 mL (0 mLs Intravenous Stopped 01/05/23 1055)     IMPRESSION / MDM / ASSESSMENT AND PLAN / ED COURSE  I reviewed the triage vital signs and the nursing notes.   Patient's presentation is most consistent with acute presentation with potential threat to life or bodily function.   Patient comes in febrile.  Patient given rectal Tylenol.  Patient difficult to tell where his symptoms are coming from given dementia.  There is also concern for maybe a fall a few days ago we will get CT head to rule out any intracranial hemorrhage and CT pan scan to rule out any obvious source for infection given the vomiting.  Labs were ordered evaluate for Electra abnormalities, AKI.  Patient had a sepsis alert called and placed on broad-spectrum antibiotics although unclear source at this time  COVID, flu were negative.  Lactate was normal CMP shows elevated creatinine patient getting IV fluid white count is elevated concerning for infection  11:18 AM patient appears septic lactate is normal.  Patient will be given full fluid resuscitation to help prevent hypotension.  Patient is already on antibiotics.  The CT scan scan is concerning for obstructed kidney stone.  Will get a urine but I do suspect that this is the cause of his fever.  I have discussed the case with Dr. Erlene Quan he is got 2 more stents to do and they will take him for stent placement.  I will discuss with hospital team for admission.  The patient is on the cardiac monitor to evaluate for evidence of  arrhythmia and/or significant heart rate changes.      FINAL CLINICAL IMPRESSION(S) / ED DIAGNOSES   Final diagnoses:  Sepsis, due to unspecified organism, unspecified whether acute organ dysfunction present [employer])  Kidney stone     Rx / DC Orders   ED Discharge Orders  None        Note:  This document was prepared using Dragon voice recognition software and may include unintentional dictation errors.   Vanessa [place], MD 01/05/23 [phone]

## 2023-01-05 NOTE — Assessment & Plan Note (Signed)
BP stable Titrate home regimen 

## 2023-01-05 NOTE — Anesthesia Preprocedure Evaluation (Addendum)
Anesthesia Evaluation  Patient identified by MRN, date of birth, ID band Patient confused  General Assessment Comment:  Patient responds to voice, can answer "good" to a "how are you doing " question, but otherwise does not verbally respond, appears confused.  Reviewed: Allergy & Precautions, NPO status , Patient's Chart, lab work & pertinent test results, Unable to perform ROS - Chart review only  History of Anesthesia Complications Negative for: history of anesthetic complications  Airway Mallampati: Unable to assess  TM Distance: >3 FB Neck ROM: Full    Dental   Pulmonary neg pulmonary ROS, neg sleep apnea, neg COPD, Patient abstained from smoking.Not current smoker   Pulmonary exam normal breath sounds clear to auscultation       Cardiovascular Exercise Tolerance: Good METShypertension, Pt. on medications (-) CAD and (-) Past MI negative cardio ROS (-) dysrhythmias  Rhythm:Regular Rate:Normal - Systolic murmurs    Neuro/Psych  PSYCHIATRIC DISORDERS Anxiety Depression   Dementia CVA    GI/Hepatic ,neg GERD  ,,(+)     (-) substance abuse    Endo/Other  neg diabetesHypothyroidism    Renal/GU negative Renal ROS     Musculoskeletal  (+) Arthritis ,    Abdominal   Peds  Hematology   Anesthesia Other Findings Past Medical History: No date: Hypertension No date: Stroke [facility])  Reproductive/Obstetrics                             Anesthesia Physical Anesthesia Plan  ASA: 3  Anesthesia Plan: General   Post-op Pain Management: Minimal or no pain anticipated   Induction: Intravenous and Rapid sequence  PONV Risk Score and Plan: 3 and Ondansetron, Dexamethasone and Treatment may vary due to age or medical condition  Airway Management Planned: Oral ETT and Video Laryngoscope Planned  Additional Equipment: None  Intra-op Plan:   Post-operative Plan: Extubation in OR  Informed  Consent: I have reviewed the patients History and Physical, chart, labs and discussed the procedure including the risks, benefits and alternatives for the proposed anesthesia with the patient or authorized representative who has indicated his/her understanding and acceptance.     Dental advisory given  Plan Discussed with: CRNA and Surgeon  Anesthesia Plan Comments: (Discussed risks of anesthesia with patient's daughter Myra by phone (given patient's baseline dementia state), including PONV, sore throat, lip/dental/eye damage. Rare risks discussed as well, such as cardiorespiratory and neurological sequelae, and allergic reactions. Discussed the role of CRNA in patient's perioperative care. She understands.)        Anesthesia Quick Evaluation

## 2023-01-05 NOTE — Assessment & Plan Note (Addendum)
Toxic metabolic encephalopathy in setting of active sepsis associated with obstructive uropathy and infected kidney stone Patient minimally verbal at baseline CT head and chest are grossly stable Noted left obstructive uropathy with 7 mm stone and perinephric stranding Pending ureteral stent placement IV Rocephin for infectious coverage Monitor for now as general baseline is unclear at present Reassess chronic problems as well as medications once patient is cleared postoperatively

## 2023-01-05 NOTE — Assessment & Plan Note (Addendum)
Meet sepsis criteria by temperature 104, heart rate 100s, white count 19 Noted urinary source with infected left kidney stone and ureteral obstruction Status post cefepime, vancomycin and Flagyl in the ER for broad-spectrum coverage Transitioning to IV Rocephin Lactate reassuring at 1.8-1.1 Panc[place] Continue with maintenance IV fluid hydration Follow

## 2023-01-05 NOTE — Progress Notes (Signed)
CODE SEPSIS - PHARMACY COMMUNICATION  **Broad Spectrum Antibiotics should be administered within 1 hour of Sepsis diagnosis**  Time Code Sepsis Called/Page Received: 0912  Antibiotics Ordered: vancomycin 1,000 mg x 1 and cefepime 2 grams x 1  Time of 1st antibiotic administration: 0929  Additional action taken by pharmacy: none  If necessary, Name of Provider/Nurse Contacted: n/a   Glean Salvo, PharmD, BCPS Clinical Pharmacist  01/05/2023 9:39 AM

## 2023-01-05 NOTE — Op Note (Signed)
Date of procedure: 01/05/23  Preoperative diagnosis:  Left obstructing UPJ stone Sepsis of probable urinary source  Postoperative diagnosis:  Same as above Left pyonephrosis  Procedure: Left retrograde pyelogram Left ureteral stent placement Interpretation of TRUS to be less than 30 minutes  Surgeon: Hollice Espy, MD  Anesthesia: General  Complications: None  Intraoperative findings: Obstructing left UPJ stone.  Copious amounts of purulent material drained at the time of stent placement.  Foley catheter left for maximal urinary decompression.  EBL: Minimal  Specimens: UA/urine culture  Drains: 6 x 24 French double-J ureteral stent on the left  Indication: Johnny Rice is a 77 y.o. patient with obstructing left UPJ stone is presumed source of sepsis.  After reviewing the management options for treatment, he elected to proceed with the above surgical procedure(s). We have discussed the potential benefits and risks of the procedure, side effects of the proposed treatment, the likelihood of the patient achieving the goals of the procedure, and any potential problems that might occur during the procedure or recuperation. Informed consent has been obtained from the patient's healthcare proxy, his daughter Johnny Rice by telephone today.  All questions were answered.  Description of procedure:  The patient was taken to the operating room and general anesthesia was induced.  The patient was placed in the dorsal lithotomy position, prepped and draped in the usual sterile fashion, and preoperative antibiotics were administered. A preoperative time-out was performed.   A 21 French cystoscope was advanced per urethra into the bladder.  The bladder was mildly erythematous without any other obvious lesions.  Attention was turned to the left UO.  On scout imaging, the round UPJ stone could be seen easily on scout.  The left distal ureter was cannulated the left distal ureter was cannulate using wire  and open-ended ureteral catheter.  A gentle retrograde pyelogram was performed w mildly dilated collecting system with a filling defect at the UPJ.  A sensor wire was then placed up to the level of the kidney.  A 6 x 24 French double-J ureteral stent was advanced up to the level of the kidney.  Once in adequate position, the wire was removed and a full curl was created both within the renal pelvis as well as within the bladder.  Immediately upon doing so, there is copious amount of purulent discharge from each of the perforations in the stent.  I sent UA/urine culture after the stent was placed.  In order to maximally decompress the collecting system, I did go ahead and place a 16 [place] Foley catheter.  He was then cleaned and dried, repositioned in supine position, reversed of anesthesia and taken to the PACU in stable condition.  Plan: Foley catheter may be removed in the morning if his clinical status is improving.  Will plan on definitive stone surgery in 2 to 3 weeks once his infection is cleared.  Hollice Espy, M.D.

## 2023-01-06 ENCOUNTER — Encounter: Payer: Self-pay | Admitting: Urology

## 2023-01-06 DIAGNOSIS — R0789 Other chest pain: Secondary | ICD-10-CM | POA: Insufficient documentation

## 2023-01-06 DIAGNOSIS — R001 Bradycardia, unspecified: Secondary | ICD-10-CM | POA: Insufficient documentation

## 2023-01-06 DIAGNOSIS — N401 Enlarged prostate with lower urinary tract symptoms: Secondary | ICD-10-CM

## 2023-01-06 DIAGNOSIS — N390 Urinary tract infection, site not specified: Secondary | ICD-10-CM

## 2023-01-06 DIAGNOSIS — G9341 Metabolic encephalopathy: Secondary | ICD-10-CM

## 2023-01-06 DIAGNOSIS — S01512S Laceration without foreign body of oral cavity, sequela: Secondary | ICD-10-CM

## 2023-01-06 DIAGNOSIS — A419 Sepsis, unspecified organism: Secondary | ICD-10-CM | POA: Diagnosis not present

## 2023-01-06 DIAGNOSIS — S01512A Laceration without foreign body of oral cavity, initial encounter: Secondary | ICD-10-CM | POA: Insufficient documentation

## 2023-01-06 DIAGNOSIS — Z8673 Personal history of transient ischemic attack (TIA), and cerebral infarction without residual deficits: Secondary | ICD-10-CM

## 2023-01-06 DIAGNOSIS — N139 Obstructive and reflux uropathy, unspecified: Secondary | ICD-10-CM | POA: Diagnosis not present

## 2023-01-06 DIAGNOSIS — N200 Calculus of kidney: Secondary | ICD-10-CM

## 2023-01-06 DIAGNOSIS — N179 Acute kidney failure, unspecified: Secondary | ICD-10-CM | POA: Diagnosis not present

## 2023-01-06 DIAGNOSIS — B952 Enterococcus as the cause of diseases classified elsewhere: Secondary | ICD-10-CM

## 2023-01-06 DIAGNOSIS — N201 Calculus of ureter: Secondary | ICD-10-CM | POA: Diagnosis not present

## 2023-01-06 DIAGNOSIS — I10 Essential (primary) hypertension: Secondary | ICD-10-CM

## 2023-01-06 DIAGNOSIS — R079 Chest pain, unspecified: Secondary | ICD-10-CM

## 2023-01-06 DIAGNOSIS — R3911 Hesitancy of micturition: Secondary | ICD-10-CM

## 2023-01-06 DIAGNOSIS — R6520 Severe sepsis without septic shock: Secondary | ICD-10-CM

## 2023-01-06 DIAGNOSIS — E039 Hypothyroidism, unspecified: Secondary | ICD-10-CM

## 2023-01-06 LAB — BLOOD CULTURE ID PANEL (REFLEXED) - BCID2

## 2023-01-06 LAB — CBC
HCT: 36.9 % — ABNORMAL LOW (ref 39.0–52.0)
Hemoglobin: 12.3 g/dL — ABNORMAL LOW (ref 13.0–17.0)
MCH: 28.9 pg (ref 26.0–34.0)
MCHC: 33.3 g/dL (ref 30.0–36.0)
MCV: 86.8 fL (ref 80.0–100.0)
Platelets: 237 10*3/uL (ref 150–400)
RBC: 4.25 MIL/uL (ref 4.22–5.81)
RDW: 14.6 % (ref 11.5–15.5)
WBC: 17.3 10*3/uL — ABNORMAL HIGH (ref 4.0–10.5)
nRBC: 0 % (ref 0.0–0.2)

## 2023-01-06 LAB — COMPREHENSIVE METABOLIC PANEL
ALT: 11 U/L (ref 0–44)
AST: 18 U/L (ref 15–41)
Albumin: 2.9 g/dL — ABNORMAL LOW (ref 3.5–5.0)
Alkaline Phosphatase: 125 U/L (ref 38–126)
Anion gap: 9 (ref 5–15)
BUN: 27 mg/dL — ABNORMAL HIGH (ref 8–23)
CO2: 20 mmol/L — ABNORMAL LOW (ref 22–32)
Calcium: 8.7 mg/dL — ABNORMAL LOW (ref 8.9–10.3)
Chloride: 112 mmol/L — ABNORMAL HIGH (ref 98–111)
Creatinine, Ser: 1.1 mg/dL (ref 0.61–1.24)
GFR, Estimated: >60 mL/min (ref >60)
Glucose, Bld: 132 mg/dL — ABNORMAL HIGH (ref 70–99)
Potassium: 4.2 mmol/L (ref 3.5–5.1)
Sodium: 141 mmol/L (ref 135–145)
Total Bilirubin: 1 mg/dL (ref 0.3–1.2)
Total Protein: 6.4 g/dL — ABNORMAL LOW (ref 6.5–8.1)

## 2023-01-06 LAB — TROPONIN I (HIGH SENSITIVITY)
Troponin I (High Sensitivity): 12 ng/L (ref <18)
Troponin I (High Sensitivity): 11 ng/L (ref <18)

## 2023-01-06 LAB — URINE CULTURE: Culture: NO GROWTH

## 2023-01-06 MED ORDER — CHLORHEXIDINE GLUCONATE CLOTH 2 % EX PADS
6.0000 | MEDICATED_PAD | Freq: Every day | CUTANEOUS | Status: DC
  Administered 2023-01-06: 6 via TOPICAL

## 2023-01-06 MED ORDER — SODIUM CHLORIDE 0.9 % IV SOLN: INTRAVENOUS | Status: DC

## 2023-01-06 MED ORDER — ALFUZOSIN HCL ER 10 MG PO TB24
10.0000 mg | ORAL_TABLET | Freq: Every morning | ORAL | Status: DC
  Administered 2023-01-06 – 2023-01-19 (×14): 10 mg via ORAL
  Filled 2023-01-06 (×15): qty 1

## 2023-01-06 MED ORDER — ARIPIPRAZOLE 10 MG PO TABS
15.0000 mg | ORAL_TABLET | Freq: Every day | ORAL | Status: DC
  Administered 2023-01-06 – 2023-01-16 (×11): 15 mg via ORAL
  Filled 2023-01-06 (×11): qty 2

## 2023-01-06 MED ORDER — LIDOCAINE VISCOUS HCL 2 % MT SOLN
15.0000 mL | OROMUCOSAL | Status: DC | PRN
  Administered 2023-01-07 – 2023-01-10 (×2): 15 mL via OROMUCOSAL
  Filled 2023-01-06 (×4): qty 15

## 2023-01-06 MED ORDER — HYDROXYZINE HCL 50 MG PO TABS
50.0000 mg | ORAL_TABLET | Freq: Four times a day (QID) | ORAL | Status: DC | PRN
  Administered 2023-01-06 – 2023-01-19 (×5): 50 mg via ORAL
  Filled 2023-01-06 (×7): qty 1

## 2023-01-06 MED ORDER — OXYCODONE HCL 5 MG PO TABS
5.0000 mg | ORAL_TABLET | Freq: Four times a day (QID) | ORAL | Status: DC | PRN
  Administered 2023-01-06 – 2023-01-16 (×10): 5 mg via ORAL
  Filled 2023-01-06 (×10): qty 1

## 2023-01-06 MED ORDER — TRAZODONE HCL 50 MG PO TABS
50.0000 mg | ORAL_TABLET | Freq: Every day | ORAL | Status: DC
  Administered 2023-01-06 – 2023-01-11 (×6): 50 mg via ORAL
  Filled 2023-01-06 (×6): qty 1

## 2023-01-06 MED ORDER — LISINOPRIL 20 MG PO TABS
40.0000 mg | ORAL_TABLET | Freq: Every day | ORAL | Status: DC
  Administered 2023-01-06 – 2023-01-19 (×14): 40 mg via ORAL
  Filled 2023-01-06 (×14): qty 2

## 2023-01-06 MED ORDER — SODIUM CHLORIDE 0.9 % IV SOLN
3.0000 g | Freq: Four times a day (QID) | INTRAVENOUS | Status: DC
  Administered 2023-01-06 (×2): 3 g via INTRAVENOUS
  Filled 2023-01-06: qty 8
  Filled 2023-01-06 (×2): qty 3

## 2023-01-06 MED ORDER — POLYETHYLENE GLYCOL 3350 17 GM/SCOOP PO POWD
17.0000 g | ORAL | Status: DC
  Filled 2023-01-06: qty 255

## 2023-01-06 MED ORDER — LIOTHYRONINE SODIUM 25 MCG PO TABS
25.0000 ug | ORAL_TABLET | Freq: Every day | ORAL | Status: DC
  Administered 2023-01-06 – 2023-01-19 (×14): 25 ug via ORAL
  Filled 2023-01-06 (×14): qty 1

## 2023-01-06 MED ORDER — POLYETHYLENE GLYCOL 3350 17 G PO PACK
17.0000 g | PACK | ORAL | Status: DC
  Administered 2023-01-06 – 2023-01-18 (×6): 17 g via ORAL
  Filled 2023-01-06 (×7): qty 1

## 2023-01-06 MED ORDER — CLONAZEPAM 1 MG PO TABS
1.0000 mg | ORAL_TABLET | Freq: Three times a day (TID) | ORAL | Status: DC | PRN
  Administered 2023-01-06 – 2023-01-07 (×2): 1 mg via ORAL
  Filled 2023-01-06 (×3): qty 1

## 2023-01-06 MED ORDER — ASPIRIN 81 MG PO CHEW
81.0000 mg | CHEWABLE_TABLET | Freq: Every day | ORAL | Status: DC
  Administered 2023-01-06 – 2023-01-19 (×14): 81 mg via ORAL
  Filled 2023-01-06 (×14): qty 1

## 2023-01-06 MED ORDER — ATORVASTATIN CALCIUM 20 MG PO TABS
20.0000 mg | ORAL_TABLET | Freq: Every day | ORAL | Status: DC
  Administered 2023-01-06 – 2023-01-18 (×13): 20 mg via ORAL
  Filled 2023-01-06 (×13): qty 1

## 2023-01-06 MED ORDER — NITROGLYCERIN 0.4 MG SL SUBL
0.4000 mg | SUBLINGUAL_TABLET | SUBLINGUAL | Status: DC | PRN
  Administered 2023-01-06 (×2): 0.4 mg via SUBLINGUAL
  Filled 2023-01-06: qty 1

## 2023-01-06 MED ORDER — SODIUM CHLORIDE 0.9 % IV SOLN
2.0000 g | INTRAVENOUS | Status: DC
  Administered 2023-01-06 – 2023-01-19 (×76): 2 g via INTRAVENOUS
  Filled 2023-01-06: qty 2
  Filled 2023-01-06: qty 2000
  Filled 2023-01-06: qty 2
  Filled 2023-01-06: qty 2000
  Filled 2023-01-06 (×8): qty 2
  Filled 2023-01-06 (×2): qty 2000
  Filled 2023-01-06 (×2): qty 2
  Filled 2023-01-06 (×5): qty 2000
  Filled 2023-01-06: qty 2
  Filled 2023-01-06 (×3): qty 2000
  Filled 2023-01-06 (×3): qty 2
  Filled 2023-01-06: qty 2000
  Filled 2023-01-06: qty 2
  Filled 2023-01-06 (×6): qty 2000
  Filled 2023-01-06 (×3): qty 2
  Filled 2023-01-06: qty 2000
  Filled 2023-01-06 (×4): qty 2
  Filled 2023-01-06 (×4): qty 2000
  Filled 2023-01-06: qty 2
  Filled 2023-01-06 (×2): qty 2000
  Filled 2023-01-06: qty 2
  Filled 2023-01-06 (×3): qty 2000
  Filled 2023-01-06: qty 2
  Filled 2023-01-06 (×4): qty 2000
  Filled 2023-01-06: qty 2
  Filled 2023-01-06 (×3): qty 2000
  Filled 2023-01-06 (×4): qty 2
  Filled 2023-01-06 (×4): qty 2000
  Filled 2023-01-06 (×2): qty 2
  Filled 2023-01-06: qty 2000
  Filled 2023-01-06: qty 2
  Filled 2023-01-06 (×2): qty 2000

## 2023-01-06 NOTE — Assessment & Plan Note (Signed)
Mental status better today than yesterday.

## 2023-01-06 NOTE — Anesthesia Postprocedure Evaluation (Signed)
Anesthesia Post Note  Patient: Johnny Rice  Procedure(s) Performed: CYSTOSCOPY WITH RETROGRADE PYELOGRAM/URETERAL STENT PLACEMENT (Left: Ureter)  Patient location during evaluation: PACU Anesthesia Type: General Level of consciousness: confused (as per preop) Pain management: pain level controlled Vital Signs Assessment: post-procedure vital signs reviewed and stable Respiratory status: spontaneous breathing, nonlabored ventilation, respiratory function stable and patient connected to nasal cannula oxygen Cardiovascular status: blood pressure returned to baseline and stable Postop Assessment: no apparent nausea or vomiting Anesthetic complications: no   No notable events documented.   Last Vitals:  Vitals:   01/05/23 2217 01/06/23 0326  BP: (!) 165/82 (!) 156/74  Pulse: (!) 52 (!) 46  Resp:  18  Temp: 36.6 C 36.6 C  SpO2: 100% 100%    Last Pain:  Vitals:   01/06/23 0326  TempSrc: Oral  PainSc:                  Arita Miss

## 2023-01-06 NOTE — Progress Notes (Signed)
Brief Urology Progress Note  I spoke with the patient's daughter, Johnny Rice, via telephone per patient request to discuss definitive stone treatment options. We discussed pursuing left ESWL versus URS in 2-3 weeks after he completes a course of culture appropriate antibiotics.   We discussed the ESWL is less invasive and requires less anesthesia, however he will have to pass the stone fragments following the procedure and this can cause renal colic. We also discussed that his left ureteral stent is anticipated to facilitate fragment passage, and we would leave this in place until the fragments had successfully passed. We also discussed the low, 10-20% chance of ESWL failure requiring repeat ESWL or URS. By comparison, we discussed that URS is a more invasive outpatient surgery that requires more anesthesia and replacing his ureteral stent intraoperatively. The new stent would remain in place for approximately 7-10 days; anticipate in-office cystoscopic removal due to dementia.  Based on this conversation, she would like to discuss further with family before deciding on how to proceed. She will call me back at clinic when decision is made.

## 2023-01-06 NOTE — TOC Initial Note (Signed)
Transition of Care [facility]) - Initial/Assessment Note    Patient Details  Name: Johnny Rice MRN: [id number] Date of Birth: 03-May-1946  [facility] of [facility]) CM/SW Contact:    Beverly Sessions, RN Phone Number: 01/06/2023, 2:45 PM  Clinical Narrative:                  Noted patient admitted from the [facility] Email sent to [facility] at the [facility] to determine if patient is from memory care and what his baseline mobility is   PT/OT eval pending      Patient Goals and CMS Choice            Expected Discharge Plan and Services                                              Prior Living Arrangements/Services                       Activities of Daily Living Home Assistive Devices/Equipment: Bedside commode/3-in-1, Hospital bed ADL Screening (condition at time of admission) Patient's cognitive ability adequate to safely complete daily activities?: No Is the patient deaf or have difficulty hearing?: Yes Does the patient have difficulty seeing, even when wearing glasses/contacts?: Yes Does the patient have difficulty concentrating, remembering, or making decisions?: Yes Patient able to express need for assistance with ADLs?: Yes Does the patient have difficulty dressing or bathing?: Yes Independently performs ADLs?: No Communication: Dependent Is this a change from baseline?: Change from baseline, expected to last >3 days Dressing (OT): Dependent Is this a change from baseline?: Change from baseline, expected to last >3 days Grooming: Dependent Is this a change from baseline?: Change from baseline, expected to last >3 days Feeding: Dependent Is this a change from baseline?: Change from baseline, expected to last >3 days Bathing: Dependent Is this a change from baseline?: Change from baseline, expected to last >3 days Toileting: Dependent Is this a change from baseline?: Change from baseline, expected to last >3days In/Out Bed: Dependent Is this a change  from baseline?: Change from baseline, expected to last >3 days Walks in Home: Dependent Is this a change from baseline?: Change from baseline, expected to last >3 days Does the patient have difficulty walking or climbing stairs?: Yes Weakness of Legs: Both Weakness of Arms/Hands: Both  Permission Sought/Granted                  Emotional Assessment              Admission diagnosis:  Kidney stone [N20.0] Sepsis, due to unspecified organism, unspecified whether acute organ dysfunction present [facility]) [A41.9] Obstructive uropathy [N13.9] Patient Active Problem List   Diagnosis Date Noted   Acute metabolic encephalopathy 99991111   Tongue laceration 01/06/2023   Kidney stone 01/06/2023   Sinus bradycardia 01/06/2023   Chest discomfort 01/06/2023   Obstructive uropathy 01/05/2023   Severe sepsis ([place]) 01/05/2023   AKI (acute kidney injury) ([place]) 01/05/2023   UTI (urinary tract infection) 04/23/2022   Fall at home, initial encounter 04/23/2022   Scalp laceration 04/23/2022   HLD (hyperlipidemia) 04/23/2022   Depression with anxiety 04/23/2022   Hypothyroid 04/23/2022   Cystitis 04/03/2021   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   Acute cystitis without hematuria 04/02/2021   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   Demand ischemia of myocardium  01/16/2020   Severe visual impairment 10/18/2019   Alcohol use disorder, severe, 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/04/2019   History of stroke 06/11/2019   Severe sedative, hypnotic, 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   Frequent falls 12/19/2016   Anxiety disorder, unspecified 03/12/2016   Insomnia 02/11/2016   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   Neurocognitive disorder 09/07/2013   Osteoarthritis 07/11/2013   Mild cognitive impairment 06/28/2013   BPH (benign prostatic hyperplasia)  01/24/2013   Depression 01/24/2013   Mixed hyperlipidemia 01/24/2013   Essential hypertension 01/24/2013   Hearing loss 09/02/2012   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

## 2023-01-06 NOTE — Assessment & Plan Note (Signed)
With CT scan showing a 7 mm obstructing stone at the left UVJ with hydronephrosis and perinephric stranding.  Patient had urgent left ureteral stent placed by Dr. Erlene Quan on 3/18.  As per urology okay to discontinue Foley catheter.

## 2023-01-06 NOTE — Assessment & Plan Note (Signed)
Creatinine 1.64 on presentation and down to 1.11 after IV fluids and Ureteral stent placement

## 2023-01-06 NOTE — Hospital Course (Signed)
77 y.o. male with medical history significant of hypertension, CVA, hyperlipidemia, hypothyroidism, BPH, cognitive impairment presenting with sepsis, obstructive uropathy, encephalopathy.  Limited history as the patient is minimally verbal at present/sleeping.  Is not responding to questioning at present.  Per report, patient with intractable nausea and vomiting at skilled nursing facility.  Was noted to have a temperature 104 while at facility.  No reports of abdominal pain diarrhea.  No reports of chest pain or cough.  No reports of recent falls.  Was unable to take his medications in the morning. Presented to the ER Tmax of 104, heart rate 104, blood pressure 140s over 70s.  Satting well on room air though transition to 2 L nasal cannula to keep O2 sats greater than 98%.  White count 18.7, hemoglobin 12.8, creatinine 1.64.  Potassium 3.3.  T. bili 2.7.  Chest x-ray, CT head grossly stable.  CT abdomen pelvis showed 7 mm obstructing stone at the left UPJ with upstream hydronephrosis and perinephric stranding with concern for superimposed infection.  Dr. Erlene Quan with urology consulted with plan for operative placement of ureteral stent.  3/19.  Blood cultures positive for Enterococcus.  Antibiotics changed over to Unasyn.

## 2023-01-06 NOTE — Progress Notes (Signed)
PHARMACY - PHYSICIAN COMMUNICATION CRITICAL VALUE ALERT - BLOOD CULTURE IDENTIFICATION (BCID)  Johnny Rice is an 77 y.o. male who presented to [facility] on 01/05/2023 with a chief complaint of obstructive uropathy.  Assessment:  3/4 bottles growing gram positive cocci, BCID positive for Enterococcus faecalis with no resistance detected.  Name of physician (or Provider) Contacted: Dr. Leslye Peer  Current antibiotics: ceftriaxone 2 grams IV every 24 hours  Changes to prescribed antibiotics recommended:  Will discontinue ceftriaxone and switch patient to Unasyn 3 grams IV every 6 hours.  Results for orders placed or performed during the hospital encounter of 01/05/23  Blood Culture ID Panel (Reflexed) (Collected: 01/05/2023  9:30 AM)  Result Value Ref Range   Enterococcus faecalis DETECTED (A) NOT DETECTED   Enterococcus Faecium NOT DETECTED NOT DETECTED   Listeria monocytogenes NOT DETECTED NOT DETECTED   Staphylococcus species NOT DETECTED NOT DETECTED   Staphylococcus aureus (BCID) NOT DETECTED NOT DETECTED   Staphylococcus epidermidis NOT DETECTED NOT DETECTED   Staphylococcus lugdunensis NOT DETECTED NOT DETECTED   Streptococcus species NOT DETECTED NOT DETECTED   Streptococcus agalactiae NOT DETECTED NOT DETECTED   Streptococcus pneumoniae NOT DETECTED NOT DETECTED   Streptococcus pyogenes NOT DETECTED NOT DETECTED   A.calcoaceticus-baumannii NOT DETECTED NOT DETECTED   Bacteroides fragilis NOT DETECTED NOT DETECTED   Enterobacterales NOT DETECTED NOT DETECTED   Enterobacter cloacae complex NOT DETECTED NOT DETECTED   Escherichia coli NOT DETECTED NOT DETECTED   Klebsiella aerogenes NOT DETECTED NOT DETECTED   Klebsiella oxytoca NOT DETECTED NOT DETECTED   Klebsiella pneumoniae NOT DETECTED NOT DETECTED   Proteus species NOT DETECTED NOT DETECTED   Salmonella species NOT DETECTED NOT DETECTED   Serratia marcescens NOT DETECTED NOT DETECTED   Haemophilus influenzae NOT DETECTED  NOT DETECTED   Neisseria meningitidis NOT DETECTED NOT DETECTED   Pseudomonas aeruginosa NOT DETECTED NOT DETECTED   Stenotrophomonas maltophilia NOT DETECTED NOT DETECTED   Candida albicans NOT DETECTED NOT DETECTED   Candida auris NOT DETECTED NOT DETECTED   Candida glabrata NOT DETECTED NOT DETECTED   Candida krusei NOT DETECTED NOT DETECTED   Candida parapsilosis NOT DETECTED NOT DETECTED   Candida tropicalis NOT DETECTED NOT DETECTED   Cryptococcus neoformans/gattii NOT DETECTED NOT DETECTED   Vancomycin resistance NOT DETECTED NOT DETECTED    Lorin Picket, PharmD 01/06/2023  6:56 AM

## 2023-01-06 NOTE — Evaluation (Addendum)
Physical Therapy Evaluation Patient Details Name: Johnny Rice MRN: [id number] DOB: [birth year] Today's Date: 01/06/2023  History of Present Illness  presented to ER secondary to fever, nausea/vomiting; admitted for sepsis, encephalopathy related to obstructive uropathy.  Clinical Impression  Patient resting in bed; supportive daughter at bedside. Patient alert and oriented to basic information; follows simple commands and agreeable to participation with min encouragement from therapist/daughter.  Extremely HOH-hears best from R ear and prefers to see lips when talking for optimal comprehension.  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  Globally weak and deconditioned.  Does endorse generalized pain/soreness to R ribcage/flank; unrated (FACES 4/10).  Currently requiring min/mod assist for bed mobility; sup to min/mod assist for unsupported sitting balance.  Maintains unsupported sitting with close sup (flexed, kyphotic posture); tolerates position only 2-3 minutes and requires return to supine due to increasing dizziness, increasing postural sway (requiring min/mod assist for correction x1).  Unable to maintain position for formal orthostatic assessment.  Unable/unsafe to attempt additional mobility at this time. Would benefit from skilled PT to address above deficits and promote optimal return to PLOF.; recommend transition to STR upon discharge from acute hospitalization.     Recommendations for follow up therapy are one component of a multi-disciplinary discharge planning process, led by the attending physician.  Recommendations may be updated based on patient status, additional functional criteria and insurance authorization.  Follow Up Recommendations Skilled nursing-short term rehab (<3 hours/day) Can patient physically be transported by private vehicle: No    Assistance Recommended at Discharge Frequent or constant  Supervision/Assistance  Patient can return home with the following  Two people to help with walking and/or transfers;Two people to help with bathing/dressing/bathroom    Equipment Recommendations    Recommendations for Other Services       Functional Status Assessment Patient has had a recent decline in their functional status and demonstrates the ability to make significant improvements in function in a reasonable and predictable amount of time.     Precautions / Restrictions Precautions Precautions: Fall Restrictions Weight Bearing Restrictions: No      Mobility  Bed Mobility Overal bed mobility: Needs Assistance Bed Mobility: Supine to Sit, Sit to Supine     Supine to sit: Min assist Sit to supine: Mod assist   General bed mobility comments: unsupported sitting with close sup (flexed, kyphotic posture); tolerates position only 2-3 minutes and requires return to supine due to increasing dizziness, increasing postural sway (requiring min/mod assist for correction x1)    Transfers                   General transfer comment: unsafe/unable    Ambulation/Gait               General Gait Details: unsafe/unable  Stairs            Wheelchair Mobility    Modified Rankin (Stroke Patients Only)       Balance Overall balance assessment: Needs assistance Sitting-balance support: No upper extremity supported, Feet supported Sitting balance-Leahy Scale: Fair                                       Pertinent Vitals/Pain Pain Assessment Pain Assessment: Faces Faces Pain Scale: Hurts little more Pain Location: R ribcage/flank Pain Descriptors / Indicators: Aching, Sore Pain Intervention(s): Limited activity within patient's tolerance, Monitored  during session, Repositioned    Home Living Family/patient expects to be discharged to:: Assisted living                 Home Equipment: Rollator (4 wheels);Shower seat Additional  Comments: Resident of the [place] at [facility] (for two years)    Prior Function Prior Level of Function : Needs assist             Mobility Comments: Ambulatory for limited household distances with 4WRW, ambulates to/from dining hall 1-2x/day for meals; does endorse 2-3 falls in previous week (likely due to current medical condition) ADLs Comments: staff assists with IADLs- cooking, cleaning; pt has available assit for bathing/dressing     Hand Dominance   Dominant Hand: Right    Extremity/Trunk Assessment   Upper Extremity Assessment Upper Extremity Assessment:  (R UE grossly 4-/5 throughout, L UE grossly 3-/5 throughout (baseline hemiparesis))    Lower Extremity Assessment Lower Extremity Assessment: Generalized weakness (grossly 3+/5 throughout bilat)       Communication   Communication: HOH (hears best from R ear and prefers seeing lips when talking)  Cognition Arousal/Alertness: Awake/alert Behavior During Therapy: WFL for tasks assessed/performed Overall Cognitive Status: Within Functional Limits for tasks assessed                                          General Comments      Exercises     Assessment/Plan    PT Assessment Patient needs continued PT services  PT Problem List Decreased strength;Decreased range of motion;Decreased activity tolerance;Decreased balance;Decreased mobility;Decreased coordination;Decreased cognition;Decreased knowledge of use of DME;Decreased safety awareness;Decreased knowledge of precautions;Cardiopulmonary status limiting activity       PT Treatment Interventions DME instruction;Gait training;Functional mobility training;Therapeutic activities;Therapeutic exercise;Balance training;Cognitive remediation;Patient/family education    PT Goals (Current goals can be found in the Care Plan section)  Acute Rehab PT Goals Patient Stated Goal: to consider STR prior to return to [facility] PT Goal Formulation: With  patient/family Time For Goal Achievement: 01/20/23 Potential to Achieve Goals: Fair    Frequency Min 2X/week     Co-evaluation               AM-PAC PT "6 Clicks" Mobility  Outcome Measure Help needed turning from your back to your side while in a flat bed without using bedrails?: A Little Help needed moving from lying on your back to sitting on the side of a flat bed without using bedrails?: A Lot Help needed moving to and from a bed to a chair (including a wheelchair)?: Total Help needed standing up from a chair using your arms (e.g., wheelchair or bedside chair)?: Total Help needed to walk in hospital room?: Total Help needed climbing 3-5 steps with a railing? : Total 6 Click Score: 9    End of Session   Activity Tolerance: Patient limited by fatigue Patient left: in bed;with call bell/phone within reach (OT at bedside to complete evaluation)   PT Visit Diagnosis: Muscle weakness (generalized) (M62.81);Difficulty in walking, not elsewhere classified (R26.2)    Time: [id number] PT Time Calculation (min) (ACUTE ONLY): 51 min   Charges:   PT Evaluation $PT Eval Moderate Complexity: 1 Mod         Cranston Koors H. Owens Shark, PT, DPT, NCS 01/06/23, 4:58 PM [phone]

## 2023-01-06 NOTE — Assessment & Plan Note (Signed)
On Cytomel °

## 2023-01-06 NOTE — Evaluation (Signed)
Occupational Therapy Evaluation Patient Details Name: Johnny Rice MRN: [id number] DOB: [birth year] Today's Date: 01/06/2023   History of Present Illness presented to ER secondary to fever, nausea/vomiting; admitted for sepsis, encephalopathy related to obstructive uropathy.   Clinical Impression   Patient agreeable to OT evaluation. Daughter present. Pt presenting with decreased independence in self care, balance, functional mobility/transfers, endurance, and safety awareness. Pt from [facility] (ALF) where he receives assistance from staff for ADLs as needed and all IADLs. Normally Mod I for functional mobility using rollator. Pt currently functioning at [place] for bed mobility. Once sitting EOB, pt required close supervision for static siting balance and tolerated sitting EOB for ~2-3 min before experiencing episode of dizziness. Postural sway noted and Min-Mod A required for correction. Returned to supine for safety. BP once in supine: 116/81. Pt with baseline L sided facial droop and hemiparesis. Pt will benefit from acute OT to increase overall independence in the areas of ADLs and functional mobility in order to safely discharge to next venue of care. OT recommends ongoing therapy upon discharge to maximize safety and independence with ADLs, decrease fall risk, decrease caregiver burden, and promote return to PLOF.     Recommendations for follow up therapy are one component of a multi-disciplinary discharge planning process, led by the attending physician.  Recommendations may be updated based on patient status, additional functional criteria and insurance authorization.   Follow Up Recommendations  Skilled nursing-short term rehab (<3 hours/day)     Assistance Recommended at Discharge Frequent or constant Supervision/Assistance  Patient can return home with the following Two people to help with walking and/or transfers;Two people to help with bathing/dressing/bathroom     Functional Status Assessment  Patient has had a recent decline in their functional status and demonstrates the ability to make significant improvements in function in a reasonable and predictable amount of time.  Equipment Recommendations  Other (comment) (defer to next venue of care)    Recommendations for Other Services       Precautions / Restrictions Precautions Precautions: Fall Restrictions Weight Bearing Restrictions: No      Mobility Bed Mobility Overal bed mobility: Needs Assistance Bed Mobility: Supine to Sit, Sit to Supine     Supine to sit: Min assist Sit to supine: Mod assist   General bed mobility comments: Close supervision for static sitting balance. Tolerated sitting EOB for ~2-3 min before experiencing episode of dizziness and requried return to supine. Postural sway noted, Min-Mod A required for correction.    Transfers                   General transfer comment: unsafe/unable      Balance Overall balance assessment: Needs assistance Sitting-balance support: No upper extremity supported, Feet supported Sitting balance-Leahy Scale: Fair         ADL either performed or assessed with clinical judgement   ADL Overall ADL's : Needs assistance/impaired Eating/Feeding: Set up;Bed level   Grooming: Minimal assistance;Sitting           Upper Body Dressing : Minimal assistance;Sitting   Lower Body Dressing: Minimal assistance;Sitting/lateral leans                 General ADL Comments: Eval limited 2/2 dizziness. Above info based on anticipated levels of assistance.     Vision Baseline Vision/History: 1 Wears glasses Patient Visual Report: No change from baseline       Perception     Praxis  Pertinent Vitals/Pain Pain Assessment Pain Assessment: Faces Faces Pain Scale: Hurts little more Pain Location: R ribcage/flank Pain Descriptors / Indicators: Aching, Sore Pain Intervention(s): Limited activity within patient's  tolerance, Monitored during session, Repositioned     Hand Dominance Right   Extremity/Trunk Assessment Upper Extremity Assessment Upper Extremity Assessment: Generalized weakness (RUE grossly 4-/5, L UE grossly 3-/5; LUE baseline hemiparesis)   Lower Extremity Assessment Lower Extremity Assessment: Generalized weakness       Communication Communication Communication: HOH (hears best from R ear and prefers seeing lips when talking)   Cognition Arousal/Alertness: Awake/alert Behavior During Therapy: WFL for tasks assessed/performed Overall Cognitive Status: Within Functional Limits for tasks assessed           General Comments       Exercises Other Exercises Other Exercises: OT provided education re: role of OT, OT POC, post acute recs, sitting up for all meals, EOB/OOB mobility with assistance, home/fall safety.     Shoulder Instructions      Home Living Family/patient expects to be discharged to:: Assisted living           Home Equipment: Rollator (4 wheels);Shower seat   Additional Comments: Resident of the [place] at [facility] (for two years)      Prior Functioning/Environment Prior Level of Function : Needs assist             Mobility Comments: Ambulatory for limited household distances with [phone]; does endorse 2-3 falls in previous week (likely due to current medical condition) ADLs Comments: staff assists with IADLs- cooking, cleaning; pt has available assist for bathing/dressing, normally goes to dining hall for meals 1-2x/day        OT Problem List: Decreased strength;Decreased range of motion;Decreased activity tolerance;Impaired balance (sitting and/or standing);Decreased coordination;Decreased cognition;Decreased safety awareness;Decreased knowledge of use of DME or AE;Decreased knowledge of precautions;Cardiopulmonary status limiting activity      OT Treatment/Interventions: Splinting;Self-care/ADL training;Therapeutic exercise;Neuromuscular  education;Energy conservation;DME and/or AE instruction;Manual therapy;Modalities;Balance training;Patient/family education;Visual/perceptual remediation/compensation;Cognitive remediation/compensation;Therapeutic activities    OT Goals(Current goals can be found in the care plan section) Acute Rehab OT Goals Patient Stated Goal: get stronger, go to STR prior to return to [facility] OT Goal Formulation: With patient/family Time For Goal Achievement: 01/20/23 Potential to Achieve Goals: Fair  OT Frequency: Min 2X/week    Co-evaluation              AM-PAC OT "6 Clicks" Daily Activity     Outcome Measure Help from another person eating meals?: A Little Help from another person taking care of personal grooming?: A Little Help from another person toileting, which includes using toliet, bedpan, or urinal?: A Lot Help from another person bathing (including washing, rinsing, drying)?: A Lot Help from another person to put on and taking off regular upper body clothing?: A Little Help from another person to put on and taking off regular lower body clothing?: A Lot 6 Click Score: 15   End of Session Nurse Communication: Mobility status;Other (comment) (BP)  Activity Tolerance: Other (comment);Patient limited by fatigue (dizziness) Patient left: in bed;with call bell/phone within reach;with bed alarm set;with family/visitor present  OT Visit Diagnosis: Other abnormalities of gait and mobility (R26.89);Muscle weakness (generalized) (M62.81)                Time: [id number] OT Time Calculation (min): 17 min Charges:  OT General Charges $OT Visit: 1 Visit OT Evaluation $OT Eval Moderate Complexity: [place] MS, OTR/L ascom [phone]  01/06/23,  5:28 PM

## 2023-01-06 NOTE — Progress Notes (Signed)
Urology Inpatient Progress Note  Subjective: No acute events overnight.  He is afebrile, VSS. WBC count down today, 17.3.  Creatinine down, 1.10.  Blood cultures growing E faecalis, urine cultures pending.  On antibiotics as below. Foley catheter in place draining yellow urine. He denies abdominal pain.  He reports some tongue pain and difficulty eating hot or solid foods.  This has been ongoing for about 1 week. On review of admission CT, stone measures <1100HU in density. Approximate 11cm skin-to-stone distance.  Anti-infectives: Anti-infectives (From admission, onward)    Start     Dose/Rate Route Frequency Ordered Stop   01/06/23 0800  Ampicillin-Sulbactam (UNASYN) 3 g in sodium chloride 0.9 % 100 mL IVPB        3 g 200 mL/hr over 30 Minutes Intravenous Every 6 hours 01/06/23 0703     01/05/23 2000  cefTRIAXone (ROCEPHIN) 2 g in sodium chloride 0.9 % 100 mL IVPB  Status:  Discontinued        2 g 200 mL/hr over 30 Minutes Intravenous Every 24 hours 01/05/23 1327 01/06/23 0703   01/05/23 0915  ceFEPIme (MAXIPIME) 2 g in sodium chloride 0.9 % 100 mL IVPB        2 g 200 mL/hr over 30 Minutes Intravenous  Once 01/05/23 0911 01/05/23 0954   01/05/23 0915  metroNIDAZOLE (FLAGYL) IVPB 500 mg        500 mg 100 mL/hr over 60 Minutes Intravenous  Once 01/05/23 0911 01/05/23 1055   01/05/23 0915  vancomycin (VANCOCIN) IVPB 1000 mg/200 mL premix        1,000 mg 200 mL/hr over 60 Minutes Intravenous  Once 01/05/23 0911 01/05/23 1210       Current Facility-Administered Medications  Medication Dose Route Frequency Provider Last Rate Last Admin   0.9 % NaCl with KCl 20 mEq/ L  infusion   Intravenous Continuous Deneise Lever, MD 100 mL/hr at 01/06/23 0330 New Bag at 01/06/23 0330   acetaminophen (TYLENOL) suppository 650 mg  650 mg Rectal Q4H PRN Vanessa [place], MD   650 mg at 01/05/23 [id number]   alfuzosin (UROXATRAL) 24 hr tablet 10 mg  10 mg Oral q AM Loletha Grayer, MD   10 mg at 01/06/23  0849   Ampicillin-Sulbactam (UNASYN) 3 g in sodium chloride 0.9 % 100 mL IVPB  3 g Intravenous Q6H Loletha Grayer, MD 200 mL/hr at 01/06/23 0820 3 g at 01/06/23 0820   ARIPiprazole (ABILIFY) tablet 15 mg  15 mg Oral Daily Loletha Grayer, MD   15 mg at 01/06/23 0845   aspirin chewable tablet 81 mg  81 mg Oral Daily Loletha Grayer, MD   81 mg at 01/06/23 0844   atorvastatin (LIPITOR) tablet 20 mg  20 mg Oral QHS Wieting, Richard, MD       Chlorhexidine Gluconate Cloth 2 % PADS 6 each  6 each Topical Daily Deneise Lever, MD   6 each at 01/06/23 0847   clonazePAM (KLONOPIN) tablet 1 mg  1 mg Oral TID PRN Loletha Grayer, MD   1 mg at 01/06/23 0844   enoxaparin (LOVENOX) injection 40 mg  40 mg Subcutaneous Q24H Deneise Lever, MD   40 mg at 01/05/23 2224   hydrOXYzine (ATARAX) tablet 50 mg  50 mg Oral Q6H PRN Loletha Grayer, MD       liothyronine (CYTOMEL) tablet 25 mcg  25 mcg Oral Daily Wieting, Richard, MD       lisinopril (ZESTRIL) tablet 40 mg  40 mg Oral Daily Loletha Grayer, MD   40 mg at 01/06/23 0844   polyethylene glycol powder (GLYCOLAX/MIRALAX) container 17 g  17 g Oral QODAY Wieting, Richard, MD       Objective: Vital signs in last 24 hours: Temp:  [97.6 F (36.4 C)-99.4 F (37.4 C)] 97.6 F (36.4 C) (03/19 0823) Pulse Rate:  [46-82] 63 (03/19 0823) Resp:  [11-21] 18 (03/19 0823) BP: (115-169)/(66-90) 153/90 (03/19 0823) SpO2:  [90 %-100 %] 99 % (03/19 0823) Weight:  [76 kg] 76 kg (03/18 1259)  Intake/Output from previous day: 03/18 0701 - 03/19 0700 In: 1300 [I.V.:1300] Out: 1005 [Urine:1000; Blood:5] Intake/Output this shift: No intake/output data recorded.  Physical Exam Vitals and nursing note reviewed.  Constitutional:      General: He is not in acute distress.    Appearance: He is not ill-appearing, toxic-appearing or diaphoretic.  HENT:     Head: Normocephalic and atraumatic.  Pulmonary:     Effort: Pulmonary effort is normal. No respiratory  distress.  Skin:    General: Skin is warm and dry.  Neurological:     Mental Status: He is alert.  Psychiatric:        Mood and Affect: Mood normal.        Behavior: Behavior normal.    Lab Results:  Recent Labs    01/05/23 0912 01/06/23 0601  WBC 18.7* 17.3*  HGB 12.8* 12.3*  HCT 37.0* 36.9*  PLT 248 237   BMET Recent Labs    01/05/23 0912 01/05/23 2227 01/06/23 0601  NA 139  --  141  K 3.3* 4.0 4.2  CL 108  --  112*  CO2 20*  --  20*  GLUCOSE 155*  --  132*  BUN 25*  --  27*  CREATININE 1.64*  --  1.10  CALCIUM 8.8*  --  8.7*   PT/INR Recent Labs    01/05/23 0912  LABPROT 15.7*  INR 1.3*   Assessment & Plan: 77 year old male with PMH dementia and CVA with left-sided deficits admitted with sepsis of likely urinary source with an obstructing 7 mm left UPJ stone, now POD 1 from urgent left ureteral stent placement with Dr. Erlene Quan.  He is clinically improving today on IV antibiotics and has been afebrile x24 hours.  We discussed that he will require definitive stone management after he completes an appropriate treatment of antibiotic therapy for infection.  He requests that I speak with his daughter to discuss this plan.  Recommendations: -Continue antibiotics and follow culture sensitivities.  He will require a total of 14 days of culture appropriate therapy. -Okay to discontinue Foley catheter -I will call his daughter later today to discuss the need for definitive stone management, left ureteroscopy versus ESWL  Debroah Loop, PA-C 01/06/2023

## 2023-01-06 NOTE — Progress Notes (Signed)
OT Cancellation Note  Patient Details Name: Johnny Rice MRN: [id number] DOB: Mar 09, 1946   Cancelled Treatment:    Reason Eval/Treat Not Completed: Pain limiting ability to participate;Other (comment). OT orders received, chart reviewed. Upon entering room, RN and NT present in room assessing pt 2/2 c/o chest pain. RN requesting OT/PT hold at this time. Will re-attempt OT evaluation as able.   Lanelle Bal  [facility] 01/06/2023, 11:30 AM

## 2023-01-06 NOTE — Consult Note (Signed)
NAME: Johnny Rice  DOB: Nov 27, 1945  MRN: [id number]  Date/Time: 01/06/2023 2:36 PM  REQUESTING PROVIDER: Dr. Leslye Peer Subjective:  REASON FOR CONSULT: Enterococcus bacteremia Patient is a limited historian.  Chart reviewed. Johnny Rice is a 77 y.o. 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  He presented to the ED brought in by EMS on after he was found lethargic and lying in bed more than usual.  He also had been vomiting.  EMS checked a temperature 104.3. In the ED he had a temperature of 104.1, BP 128/97, heart rate 104 and respiratory 20 with sats of 91%.  WBC was 18.7, Hb 12.8, platelet 248 and creatinine was 1.64.  Blood culture was sent and urine analysis showed more than 50 WBC.  Urine culture was sent.  On broad-spectrum antibiotic.  A CT scan of the abdomen was obtained and that showed a 7 mm stone at the left UPJ with upstream hydronephrosis and asymmetric perinephric stranding.  He was seen by urologist and taken to the OR and a stent was placed in the right ureter.  The blood culture came back positive for Enterococcus faecalis 3 out of 4 bottles and I am seeing the patient for the same.  Antibiotic has been narrowed to Unasyn. Patient is very hard of hearing. 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 Patient has past history of recurrent UTI.  He had been followed by urologist.  A cystoscopy was done in the past that did not show any significant prostate enlargement.  He was at one time on tamsulosin and then was changed to alfuzosin.  The PVR was not more than 130. 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  Past Medical History:  Diagnosis Date   Hypertension    Stroke [facility])     Past Surgical History:  Procedure  Laterality Date   APPENDECTOMY     CYSTOSCOPY W/ URETERAL STENT PLACEMENT Left 01/05/2023   Procedure: CYSTOSCOPY WITH RETROGRADE PYELOGRAM/URETERAL STENT PLACEMENT;  Surgeon: Hollice Espy, MD;  Location: [facility] ORS;  Service: Urology;  Laterality: Left;    Social History   Socioeconomic History   Marital status: Divorced    Spouse name: Not on file   Number of children: Not on file   Years of education: Not on file   Highest education level: Not on file  Occupational History   Not on file  Tobacco Use   Smoking status: Never    Passive exposure: Never   Smokeless tobacco: Never  Vaping Use   Vaping Use: Never used  Substance and Sexual Activity   Alcohol use: Not Currently   Drug use: Not Currently   Sexual activity: Not on file  Other Topics Concern   Not on file  Social History Narrative   Not on file   Social Determinants of Health   Financial Resource Strain: Not on file  Food Insecurity: Patient Unable To Answer (01/05/2023)   Hunger Vital Sign    Worried About Running Out of Food in the Last Year: Patient unable to answer    [place] in the Last Year: Patient unable to answer  Transportation Needs: Patient Unable To Answer (01/05/2023)   PRAPARE - Transportation    Lack of Transportation (Medical): Patient unable to answer  Lack of Transportation (Non-Medical): Patient unable to answer  Physical Activity: Not on file  Stress: Not on file  Social Connections: Not on file  Intimate Partner Violence: Patient Unable To Answer (01/05/2023)   Humiliation, Afraid, Rape, and Kick questionnaire    Fear of Current or Ex-Partner: Patient unable to answer    Emotionally Abused: Patient unable to answer    Physically Abused: Patient unable to answer    Sexually Abused: Patient unable to answer    Family History  Problem Relation Age of Onset   Heart disease Neg Hx    No Known Allergies I? Current Facility-Administered Medications  Medication Dose Route  Frequency Provider Last Rate Last Admin   0.9 %  sodium chloride infusion   Intravenous Continuous Loletha Grayer, MD 40 mL/hr at 01/06/23 1143 New Bag at 01/06/23 1143   acetaminophen (TYLENOL) suppository 650 mg  650 mg Rectal Q4H PRN Vanessa Aguada, MD   650 mg at 01/05/23 0929   alfuzosin (UROXATRAL) 24 hr tablet 10 mg  10 mg Oral q AM Loletha Grayer, MD   10 mg at 01/06/23 0849   Ampicillin-Sulbactam (UNASYN) 3 g in sodium chloride 0.9 % 100 mL IVPB  3 g Intravenous Q6H Loletha Grayer, MD 200 mL/hr at 01/06/23 0820 3 g at 01/06/23 0820   ARIPiprazole (ABILIFY) tablet 15 mg  15 mg Oral Daily Loletha Grayer, MD   15 mg at 01/06/23 0845   aspirin chewable tablet 81 mg  81 mg Oral Daily Loletha Grayer, MD   81 mg at 01/06/23 0844   atorvastatin (LIPITOR) tablet 20 mg  20 mg Oral QHS Wieting, Richard, MD       Chlorhexidine Gluconate Cloth 2 % PADS 6 each  6 each Topical Daily Deneise Lever, MD   6 each at 01/06/23 0847   clonazePAM (KLONOPIN) tablet 1 mg  1 mg Oral TID PRN Loletha Grayer, MD   1 mg at 01/06/23 0844   enoxaparin (LOVENOX) injection 40 mg  40 mg Subcutaneous Q24H Deneise Lever, MD   40 mg at 01/05/23 2224   hydrOXYzine (ATARAX) tablet 50 mg  50 mg Oral Q6H PRN Loletha Grayer, MD   50 mg at 01/06/23 1219   lidocaine (XYLOCAINE) 2 % viscous mouth solution 15 mL  15 mL Mouth/Throat Q4H PRN Loletha Grayer, MD       liothyronine (CYTOMEL) tablet 25 mcg  25 mcg Oral Daily Loletha Grayer, MD   25 mcg at 01/06/23 1135   lisinopril (ZESTRIL) tablet 40 mg  40 mg Oral Daily Loletha Grayer, MD   40 mg at 01/06/23 0844   nitroGLYCERIN (NITROSTAT) SL tablet 0.4 mg  0.4 mg Sublingual Q5 min PRN Loletha Grayer, MD   0.4 mg at 01/06/23 1117   oxyCODONE (Oxy IR/ROXICODONE) immediate release tablet 5 mg  5 mg Oral Q6H PRN Loletha Grayer, MD   5 mg at 01/06/23 1107   polyethylene glycol (MIRALAX / GLYCOLAX) packet 17 g  17 g Oral Gertie Gowda, Richard, MD   17 g at 01/06/23  1337     Abtx:  Anti-infectives (From admission, onward)    Start     Dose/Rate Route Frequency Ordered Stop   01/06/23 0800  Ampicillin-Sulbactam (UNASYN) 3 g in sodium chloride 0.9 % 100 mL IVPB        3 g 200 mL/hr over 30 Minutes Intravenous Every 6 hours 01/06/23 0703     01/05/23 2000  cefTRIAXone (ROCEPHIN) 2 g in  sodium chloride 0.9 % 100 mL IVPB  Status:  Discontinued        2 g 200 mL/hr over 30 Minutes Intravenous Every 24 hours 01/05/23 1327 01/06/23 0703   01/05/23 0915  ceFEPIme (MAXIPIME) 2 g in sodium chloride 0.9 % 100 mL IVPB        2 g 200 mL/hr over 30 Minutes Intravenous  Once 01/05/23 0911 01/05/23 0954   01/05/23 0915  metroNIDAZOLE (FLAGYL) IVPB 500 mg        500 mg 100 mL/hr over 60 Minutes Intravenous  Once 01/05/23 0911 01/05/23 1055   01/05/23 0915  vancomycin (VANCOCIN) IVPB 1000 mg/200 mL premix        1,000 mg 200 mL/hr over 60 Minutes Intravenous  Once 01/05/23 0911 01/05/23 1210       REVIEW OF SYSTEMS:  Const:  fever, chills, negative weight loss Eyes: negative diplopia or visual changes, blindness left eye  ENT: negative coryza, negative sore throat Resp: negative cough, hemoptysis, dyspnea Cards: negative for chest pain, palpitations, lower extremity edema GU: Dysuria and frequency GI: Pain left flank Skin: negative for rash and pruritus Heme: negative for easy bruising and gum/nose bleeding MS: General weakness Neurolo: Left-sided weakness, left facial weakness Psych:  anxiety Endocrine: negative for thyroid, diabetes Allergy/Immunology- negative for any medication or food allergies ? Objective:  VITALS:  BP 110/79   Pulse 76   Temp 97.6 F (36.4 C)   Resp 16   Ht 5\' 7"  (1.702 m)   Wt 76 kg   SpO2 97%   BMI 26.24 kg/m   PHYSICAL EXAM:  General: Awake, alert,  no distress, hard of hearing. Head: Normocephalic, without obvious abnormality, atraumatic. Eyes: Left eye smaller and blindness present Left facial droop  Neck:   symmetrical, no adenopathy, thyroid: non tender no carotid bruit and no JVD. Back: No CVA tenderness. Lungs: Bilateral air entry.  Decreased in the bases.  Heart: Regular rate and rhythm, no murmur, rub or gallop. Abdomen: Soft, non-tender,not distended. Bowel sounds normal. No masses Some tenderness over the left side of the abdomen and flank Extremities: atraumatic, no cyanosis. No edema. No clubbing Skin: Limited examination. Lymph: Cervical, supraclavicular normal. Neurologic: Weakness left side. Pertinent Labs Lab Results CBC    Component Value Date/Time   WBC 17.3 (H) 01/06/2023 0601   RBC 4.25 01/06/2023 0601   HGB 12.3 (L) 01/06/2023 0601   HCT 36.9 (L) 01/06/2023 0601   PLT 237 01/06/2023 0601   MCV 86.8 01/06/2023 0601   MCH 28.9 01/06/2023 0601   MCHC 33.3 01/06/2023 0601   RDW 14.6 01/06/2023 0601   LYMPHSABS 0.6 (L) 01/05/2023 0912   MONOABS 1.4 (H) 01/05/2023 0912   EOSABS 0.0 01/05/2023 0912   BASOSABS 0.1 01/05/2023 0912       Latest Ref Rng & Units 01/06/2023    6:01 AM 01/05/2023   10:27 PM 01/05/2023    9:12 AM  CMP  Glucose 70 - 99 mg/dL 132   155   BUN 8 - 23 mg/dL 27   25   Creatinine 0.61 - 1.24 mg/dL 1.10   1.64   Sodium 135 - 145 mmol/L 141   139   Potassium 3.5 - 5.1 mmol/L 4.2  4.0  3.3   Chloride 98 - 111 mmol/L 112   108   CO2 22 - 32 mmol/L 20   20   Calcium 8.9 - 10.3 mg/dL 8.7   8.8   Total Protein 6.5 - 8.1  g/dL 6.4   7.3   Total Bilirubin 0.3 - 1.2 mg/dL 1.0   2.7   Alkaline Phos 38 - 126 U/L 125   162   AST 15 - 41 U/L 18   25   ALT 0 - 44 U/L 11   13       Microbiology: Recent Results (from the past 240 hour(s))  Resp panel by RT-PCR (RSV, Flu A&B, Covid) Anterior Nasal Swab     Status: None   Collection Time: 01/05/23  9:10 AM   Specimen: Anterior Nasal Swab  Result Value Ref Range Status   SARS Coronavirus 2 by RT PCR NEGATIVE NEGATIVE Final    Comment: (NOTE) SARS-CoV-2 target nucleic acids are NOT DETECTED.  The  SARS-CoV-2 RNA is generally detectable in upper respiratory specimens during the acute phase of infection. The lowest concentration of SARS-CoV-2 viral copies this assay can detect is 138 copies/mL. A negative result does not preclude SARS-Cov-2 infection and should not be used as the sole basis for treatment or other patient management decisions. A negative result may occur with  improper specimen collection/handling, submission of specimen other than nasopharyngeal swab, presence of viral mutation(s) within the areas targeted by this assay, and inadequate number of viral copies(<138 copies/mL). A negative result must be combined with clinical observations, patient history, and epidemiological information. The expected result is Negative.  Fact Sheet for Patients:  EntrepreneurPulse.com.au  Fact Sheet for Healthcare Providers:  IncredibleEmployment.be  This test is no t yet approved or cleared by the [place] FDA and  has been authorized for detection and/or diagnosis of SARS-CoV-2 by FDA under an Emergency Use Authorization (EUA). This EUA will remain  in effect (meaning this test can be used) for the duration of the COVID-19 declaration under Section 564(b)(1) of the Act, 21 U.S.C.section 360bbb-3(b)(1), unless the authorization is terminated  or revoked sooner.       Influenza A by PCR NEGATIVE NEGATIVE Final   Influenza B by PCR NEGATIVE NEGATIVE Final    Comment: (NOTE) The Xpert Xpress SARS-CoV-2/FLU/RSV plus assay is intended as an aid in the diagnosis of influenza from Nasopharyngeal swab specimens and should not be used as a sole basis for treatment. Nasal washings and aspirates are unacceptable for Xpert Xpress SARS-CoV-2/FLU/RSV testing.  Fact Sheet for Patients: EntrepreneurPulse.com.au  Fact Sheet for Healthcare Providers: IncredibleEmployment.be  This test is not yet approved or  cleared by the [place] FDA and has been authorized for detection and/or diagnosis of SARS-CoV-2 by FDA under an Emergency Use Authorization (EUA). This EUA will remain in effect (meaning this test can be used) for the duration of the COVID-19 declaration under Section 564(b)(1) of the Act, 21 U.S.C. section 360bbb-3(b)(1), unless the authorization is terminated or revoked.     Resp Syncytial Virus by PCR NEGATIVE NEGATIVE Final    Comment: (NOTE) Fact Sheet for Patients: EntrepreneurPulse.com.au  Fact Sheet for Healthcare Providers: IncredibleEmployment.be  This test is not yet approved or cleared by the [place] FDA and has been authorized for detection and/or diagnosis of SARS-CoV-2 by FDA under an Emergency Use Authorization (EUA). This EUA will remain in effect (meaning this test can be used) for the duration of the COVID-19 declaration under Section 564(b)(1) of the Act, 21 U.S.C. section 360bbb-3(b)(1), unless the authorization is terminated or revoked.  Performed at [facility], [address] [place], [place] [address]   Blood Culture (routine x 2)     Status: None (Preliminary result)  Collection Time: 01/05/23  9:30 AM   Specimen: BLOOD  Result Value Ref Range Status   Specimen Description   Final    BLOOD LEFT ANTECUBITAL Performed at [facility], [address] [place], [place] [address]    Special Requests   Final    BOTTLES DRAWN AEROBIC AND ANAEROBIC Blood Culture results may not be optimal due to an excessive volume of blood received in culture bottles Performed at [facility], [address] [place], [place] [address]    Culture  Setup Time   Final    GRAM POSITIVE COCCI AEROBIC BOTTLE ONLY CRITICAL VALUE NOTED.  VALUE IS CONSISTENT WITH PREVIOUSLY REPORTED AND CALLED VALUE. Performed at [facility], [address] [place], [place] [address]    Culture   Final     NO GROWTH < 24 HOURS Performed at [facility] [address], [place], [place] [address]    Report Status PENDING  Incomplete  Blood Culture (routine x 2)     Status: None (Preliminary result)   Collection Time: 01/05/23  9:30 AM   Specimen: BLOOD  Result Value Ref Range Status   Specimen Description BLOOD RIGHT ANTECUBITAL  Final   Special Requests   Final    BOTTLES DRAWN AEROBIC AND ANAEROBIC Blood Culture results may not be optimal due to an excessive volume of blood received in culture bottles   Culture  Setup Time   Final    GRAM POSITIVE COCCI IN BOTH AEROBIC AND ANAEROBIC BOTTLES Organism ID to follow CRITICAL RESULT CALLED TO, READ BACK BY AND VERIFIED WITH: NATHAN BLUE@[id number]  01/06/23 RH Performed at [facility], [address] [place], [place] [address]    Culture GRAM POSITIVE COCCI  Final   Report Status PENDING  Incomplete  Blood Culture ID Panel (Reflexed)     Status: Abnormal   Collection Time: 01/05/23  9:30 AM  Result Value Ref Range Status   Enterococcus faecalis DETECTED (A) NOT DETECTED Final    Comment: CRITICAL RESULT CALLED TO, READ BACK BY AND VERIFIED WITH: NATHAN BLUE@0529  01/06/23 RH    Enterococcus Faecium NOT DETECTED NOT DETECTED Final   Listeria monocytogenes NOT DETECTED NOT DETECTED Final   Staphylococcus species NOT DETECTED NOT DETECTED Final   Staphylococcus aureus (BCID) NOT DETECTED NOT DETECTED Final   Staphylococcus epidermidis NOT DETECTED NOT DETECTED Final   Staphylococcus lugdunensis NOT DETECTED NOT DETECTED Final   Streptococcus species NOT DETECTED NOT DETECTED Final   Streptococcus agalactiae NOT DETECTED NOT DETECTED Final   Streptococcus pneumoniae NOT DETECTED NOT DETECTED Final   Streptococcus pyogenes NOT DETECTED NOT DETECTED Final   A.calcoaceticus-baumannii NOT DETECTED NOT DETECTED Final   Bacteroides fragilis NOT DETECTED NOT DETECTED Final   Enterobacterales NOT DETECTED NOT DETECTED Final    Enterobacter cloacae complex NOT DETECTED NOT DETECTED Final   Escherichia coli NOT DETECTED NOT DETECTED Final   Klebsiella aerogenes NOT DETECTED NOT DETECTED Final   Klebsiella oxytoca NOT DETECTED NOT DETECTED Final   Klebsiella pneumoniae NOT DETECTED NOT DETECTED Final   Proteus species NOT DETECTED NOT DETECTED Final   Salmonella species NOT DETECTED NOT DETECTED Final   Serratia marcescens NOT DETECTED NOT DETECTED Final   Haemophilus influenzae NOT DETECTED NOT DETECTED Final   Neisseria meningitidis NOT DETECTED NOT DETECTED Final   Pseudomonas aeruginosa NOT DETECTED NOT DETECTED Final   Stenotrophomonas maltophilia NOT DETECTED NOT DETECTED Final   Candida albicans NOT DETECTED NOT DETECTED Final  Candida auris NOT DETECTED NOT DETECTED Final   Candida glabrata NOT DETECTED NOT DETECTED Final   Candida krusei NOT DETECTED NOT DETECTED Final   Candida parapsilosis NOT DETECTED NOT DETECTED Final   Candida tropicalis NOT DETECTED NOT DETECTED Final   Cryptococcus neoformans/gattii NOT DETECTED NOT DETECTED Final   Vancomycin resistance NOT DETECTED NOT DETECTED Final    Comment: Performed at [facility], [address], [place], [place] [address]  Urine Culture     Status: None   Collection Time: 01/05/23  1:59 PM   Specimen: Urine, Random  Result Value Ref Range Status   Specimen Description   Final    URINE, RANDOM Performed at [facility], [address], [place],  [address]    Special Requests LEFT URETER  Final   Culture   Final    NO GROWTH Performed at [facility], [place] [address], [place],  [address]    Report Status 01/06/2023 FINAL  Final    IMAGING RESULTS: CT abdomen and pelvis reviewed Left hydronephrosis secondary to a P UVJ stone. I have personally reviewed the films ? Impression/Recommendation Sepsis secondary to complicated UTI from obstructive uropathy due to a stone on the left side causing  hydronephrosis.  Status post stent placement.  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  Patient is currently on Unasyn. Changed to ampicillin 2 g every 4 adjusted to creatinine Will repeat blood cultures Do not suspect endocarditis at this time  Consider CPAP dependence.  On Klonopin  Left sided facial palsy residual  Left eye blindness  Left-sided weakness  Discussed with patient, requesting provider Note:  This document was prepared using Dragon voice recognition software and may include unintentional dictation errors.

## 2023-01-06 NOTE — Progress Notes (Signed)
Pt placed on telemetry as ordered, notified by tele staff of pt HR maintaining in the 30-40's. Pt sleeping, will arouse to voice , and has no complaints, Hr at 50-52 when awakened for vitals. Neomia Glass  NP made aware of above, EKG obtained and results paged to NP.

## 2023-01-06 NOTE — Assessment & Plan Note (Signed)
On aspirin. °

## 2023-01-06 NOTE — Progress Notes (Addendum)
Progress Note   Patient: Johnny Rice [id number] DOB: [birth year] DOA: 01/05/2023     1 DOS: the patient was seen and examined on 01/06/2023   Brief hospital course: 77 y.o. male with medical history significant of hypertension, CVA, hyperlipidemia, hypothyroidism, BPH, cognitive impairment presenting with sepsis, obstructive uropathy, encephalopathy.  Limited history as the patient is minimally verbal at present/sleeping.  Is not responding to questioning at present.  Per report, patient with intractable nausea and vomiting at skilled nursing facility.  Was noted to have a temperature 104 while at facility.  No reports of abdominal pain diarrhea.  No reports of chest pain or cough.  No reports of recent falls.  Was unable to take his medications in the morning. Presented to the ER Tmax of 104, heart rate 104, blood pressure 140s over 70s.  Satting well on room air though transition to 2 L nasal cannula to keep O2 sats greater than 98%.  White count 18.7, hemoglobin 12.8, creatinine 1.64.  Potassium 3.3.  T. bili 2.7.  Chest x-ray, CT head grossly stable.  CT abdomen pelvis showed 7 mm obstructing stone at the left UPJ with upstream hydronephrosis and perinephric stranding with concern for superimposed infection.  Dr. Erlene Quan with urology consulted with plan for operative placement of ureteral stent.  3/19.  Blood cultures positive for Enterococcus.  Antibiotics changed over to Unasyn.  Assessment and Plan: * Severe sepsis ([place]) Present on admission.  Temperature 104.1, heart rate 104, white count 18.7, acute kidney injury and acute metabolic encephalopathy..  Enterococcus growing out of blood cultures.  Antibiotics changed over to Unasyn.  Suspected urine source.  Awaiting urine culture.   Acute metabolic encephalopathy Mental status better today than yesterday.  Obstructive uropathy With CT scan showing a 7 mm obstructing stone at the left UVJ with hydronephrosis and perinephric stranding.   Patient had urgent left ureteral stent placed by Dr. Erlene Quan on 3/18.  As per urology okay to discontinue Foley catheter.  AKI (acute kidney injury) ([place]) Creatinine 1.64 on presentation and down to 1.11 after IV fluids and Ureteral stent placement   History of stroke On aspirin  Essential hypertension Continue lisinopril  BPH (benign prostatic hyperplasia) On Uroxatrol  Hypothyroid On Cytomel  Chest discomfort EKG normal sinus rhythm, first troponin negative.  Likely not cardiac.  Sinus bradycardia Holding metoprolol  Tongue laceration On viscous lidocaine        Subjective: Patient has pain on his tongue, had headache this morning.  Later in the morning he did have some chest discomfort but it seems more right upper quadrant but that went away after pain medication.  EKG normal sinus rhythm.  Physical Exam: Vitals:   01/06/23 0823 01/06/23 1110 01/06/23 1118 01/06/23 1123  BP: (!) 153/90 98/71 107/72 110/79  Pulse: 63 98 89 76  Resp: 18 16 16 16   Temp: 97.6 F (36.4 C)     TempSrc:      SpO2: 99% 97% 94% 97%  Weight:      Height:       Physical Exam HENT:     Head: Normocephalic.     Mouth/Throat:     Pharynx: No oropharyngeal exudate.  Eyes:     General: Lids are normal.     Conjunctiva/sclera: Conjunctivae normal.  Cardiovascular:     Rate and Rhythm: Normal rate and regular rhythm.     Heart sounds: Normal heart sounds, S1 normal and S2 normal.  Pulmonary:     Breath sounds: Examination of  the right-lower field reveals decreased breath sounds. Examination of the left-lower field reveals decreased breath sounds. Decreased breath sounds present. No wheezing, rhonchi or rales.  Abdominal:     Palpations: Abdomen is soft.     Tenderness: There is no abdominal tenderness.  Musculoskeletal:     Right lower leg: No swelling.     Left lower leg: No swelling.  Skin:    General: Skin is warm.     Findings: No rash.  Neurological:     Mental Status: He  is alert and oriented to person, place, and time.     Data Reviewed: Creatinine 1.64 on presentation down to 1.1, white blood cell count 17.3, hemoglobin 12.3  Family Communication: Updated patient's daughter on the phone  Disposition: Status is: Inpatient Remains inpatient appropriate because: Treating sepsis with Enterococcus and blood cultures secondary to urine source.  Planned Discharge Destination: To be determined    Time spent: 28 minutes  Author: Loletha Grayer, MD 01/06/2023 1:09 PM  For on call review [email or web address].

## 2023-01-06 NOTE — Assessment & Plan Note (Signed)
On viscous lidocaine

## 2023-01-06 NOTE — Assessment & Plan Note (Signed)
EKG normal sinus rhythm, first troponin negative.  Likely not cardiac.

## 2023-01-06 NOTE — Assessment & Plan Note (Addendum)
Present on admission.  Temperature 104.1, heart rate 104, white count 18.7, acute kidney injury and acute metabolic encephalopathy..  Enterococcus growing out of blood cultures.  Antibiotics changed over to Unasyn.  Suspected urine source.  Awaiting urine culture.

## 2023-01-06 NOTE — Assessment & Plan Note (Signed)
Continue lisinopril

## 2023-01-06 NOTE — Assessment & Plan Note (Signed)
Holding metoprolol

## 2023-01-06 NOTE — Assessment & Plan Note (Signed)
On Uroxatrol

## 2023-01-06 NOTE — Progress Notes (Signed)
       CROSS COVER NOTE  NAME: Nemesio Phanthavong MRN: [id number] DOB : 31-Jan-1946 ATTENDING PHYSICIAN: Deneise Lever, MD    Date of Service   01/06/2023   HPI/Events of Note   Message received from RN reporting Mr Trolinger's HR is in the 30s-40s while sleeping. When he is stimulated/awakened HR increases to 50s. BP 165/82. He is asymptomatic.  On Review of chart HR was noted to be in the 40s during July 2023 admission. Resolved with holding Metoprolol.  Interventions   Assessment/Plan:  EKG--> Sinus Bradycardia HR 42 Monitor on telemetry Continue to hold home Metoprolol       To reach the provider On-Call:   7AM- 7PM see care teams to locate the attending and reach out to them via [email or web address] 7PM-7AM contact night-coverage If you still have difficulty reaching the appropriate provider, please page the [facility] (Director on Call) for [facility] on amion for assistance  This document was prepared using Systems analyst and may include unintentional dictation errors.  Neomia Glass DNP, MBA, FNP-BC, PMHNP-BC Nurse Practitioner [facility] Pager [phone]

## 2023-01-07 DIAGNOSIS — N139 Obstructive and reflux uropathy, unspecified: Secondary | ICD-10-CM | POA: Diagnosis not present

## 2023-01-07 DIAGNOSIS — B952 Enterococcus as the cause of diseases classified elsewhere: Secondary | ICD-10-CM | POA: Diagnosis not present

## 2023-01-07 DIAGNOSIS — A419 Sepsis, unspecified organism: Secondary | ICD-10-CM | POA: Diagnosis not present

## 2023-01-07 DIAGNOSIS — N390 Urinary tract infection, site not specified: Secondary | ICD-10-CM | POA: Diagnosis not present

## 2023-01-07 DIAGNOSIS — R6520 Severe sepsis without septic shock: Secondary | ICD-10-CM | POA: Diagnosis not present

## 2023-01-07 LAB — CBC
HCT: 36.3 % — ABNORMAL LOW (ref 39.0–52.0)
Hemoglobin: 12.1 g/dL — ABNORMAL LOW (ref 13.0–17.0)
MCH: 29.2 pg (ref 26.0–34.0)
MCHC: 33.3 g/dL (ref 30.0–36.0)
MCV: 87.7 fL (ref 80.0–100.0)
Platelets: 261 10*3/uL (ref 150–400)
RBC: 4.14 MIL/uL — ABNORMAL LOW (ref 4.22–5.81)
RDW: 15 % (ref 11.5–15.5)
WBC: 17.9 10*3/uL — ABNORMAL HIGH (ref 4.0–10.5)
nRBC: 0 % (ref 0.0–0.2)

## 2023-01-07 LAB — BASIC METABOLIC PANEL
Anion gap: 4 — ABNORMAL LOW (ref 5–15)
BUN: 28 mg/dL — ABNORMAL HIGH (ref 8–23)
CO2: 21 mmol/L — ABNORMAL LOW (ref 22–32)
Calcium: 8.4 mg/dL — ABNORMAL LOW (ref 8.9–10.3)
Chloride: 114 mmol/L — ABNORMAL HIGH (ref 98–111)
Creatinine, Ser: 0.95 mg/dL (ref 0.61–1.24)
GFR, Estimated: >60 mL/min (ref >60)
Glucose, Bld: 106 mg/dL — ABNORMAL HIGH (ref 70–99)
Potassium: 4.3 mmol/L (ref 3.5–5.1)
Sodium: 139 mmol/L (ref 135–145)

## 2023-01-07 MED ORDER — ENSURE ENLIVE PO LIQD
237.0000 mL | Freq: Three times a day (TID) | ORAL | Status: DC
  Administered 2023-01-09 – 2023-01-19 (×23): 237 mL via ORAL

## 2023-01-07 MED ORDER — CLONAZEPAM 1 MG PO TABS: 1.0000 mg | ORAL_TABLET | Freq: Three times a day (TID) | ORAL | Status: DC

## 2023-01-07 MED ORDER — ADULT MULTIVITAMIN W/MINERALS CH
1.0000 | ORAL_TABLET | Freq: Every day | ORAL | Status: DC
  Administered 2023-01-08 – 2023-01-19 (×11): 1 via ORAL
  Filled 2023-01-07 (×12): qty 1

## 2023-01-07 MED ORDER — CLONAZEPAM 1 MG PO TABS
1.0000 mg | ORAL_TABLET | Freq: Once | ORAL | Status: AC
  Administered 2023-01-07: 1 mg via ORAL

## 2023-01-07 MED ORDER — CLONAZEPAM 1 MG PO TABS
1.0000 mg | ORAL_TABLET | Freq: Three times a day (TID) | ORAL | Status: DC
  Administered 2023-01-07 – 2023-01-13 (×15): 1 mg via ORAL
  Filled 2023-01-07 (×20): qty 1

## 2023-01-07 NOTE — TOC PASRR Note (Signed)
RE: Johnny Rice   Date of Birth: Mar 21, 2046   Date: 01/07/23    To Whom It May Concern:   Please be advised that the above-named patient has a primary diagnosis of dementia which supersedes any psychiatric diagnosis.

## 2023-01-07 NOTE — Progress Notes (Signed)
PT Cancellation Note  Patient Details Name: Johnny Rice MRN: [id number] DOB: 01-17-1946   Cancelled Treatment:     PT attempt. Pt refused. He endorses not feeling well + requested author  return at a later time. Acute PT will continue to follow and progress as able per current POC. SNF remains most appropriate DC disposition.    Willette Pa 01/07/2023, 10:59 AM

## 2023-01-07 NOTE — Progress Notes (Signed)
Date of Admission:  01/05/2023   Total days of antibiotics ***        Day ***        Day ***        Day ***   ID: Johnny Rice is a 77 y.o. male with  *** Principal Problem:   Severe sepsis (HCC) Active Problems:   BPH (benign prostatic hyperplasia)   History of stroke   Essential hypertension   Hypothyroid   Obstructive uropathy   AKI (acute kidney injury) ([place])   Acute metabolic encephalopathy   Tongue laceration   Kidney stone   Sinus bradycardia   Chest discomfort    Subjective: ***  Medications:   alfuzosin  10 mg Oral q AM   ARIPiprazole  15 mg Oral Daily   aspirin  81 mg Oral Daily   atorvastatin  20 mg Oral QHS   clonazePAM  1 mg Oral TID   enoxaparin (LOVENOX) injection  40 mg Subcutaneous Q24H   liothyronine  25 mcg Oral Daily   lisinopril  40 mg Oral Daily   polyethylene glycol  17 g Oral QODAY   traZODone  50 mg Oral QHS    Objective: Vital signs in last 24 hours: Patient Vitals for the past 24 hrs:  BP Temp Temp src Pulse Resp SpO2  01/07/23 0807 (!) 152/85 98.7 F (37.1 C) -- (!) 47 18 99 %  01/07/23 0257 (!) 165/97 97.9 F (36.6 C) -- (!) 51 18 99 %  01/06/23 2014 133/86 97.9 F (36.6 C) Oral (!) 55 16 98 %  01/06/23 1619 109/78 97.6 F (36.4 C) -- 78 16 96 %     LDA Foley Central lines Other catheters  PHYSICAL EXAM:  General: Alert, cooperative, no distress, appears stated age.  Head: Normocephalic, without obvious abnormality, atraumatic. Eyes: Conjunctivae clear, anicteric sclerae. Pupils are equal ENT Nares normal. No drainage or sinus tenderness. Lips, mucosa, and tongue normal. No Thrush Neck: Supple, symmetrical, no adenopathy, thyroid: non tender no carotid bruit and no JVD. Back: No CVA tenderness. Lungs: Clear to auscultation bilaterally. No Wheezing or Rhonchi. No rales. Heart: Regular rate and rhythm, no murmur, rub or gallop. Abdomen: Soft, non-tender,not distended. Bowel sounds normal. No masses Extremities:  atraumatic, no cyanosis. No edema. No clubbing Skin: No rashes or lesions. Or bruising Lymph: Cervical, supraclavicular normal. Neurologic: Grossly non-focal  Lab Results    Latest Ref Rng & Units 01/07/2023    7:08 AM 01/06/2023    6:01 AM 01/05/2023    9:12 AM  CBC  WBC 4.0 - 10.5 K/uL 17.9  17.3  18.7   Hemoglobin 13.0 - 17.0 g/dL 12.1  12.3  12.8   Hematocrit 39.0 - 52.0 % 36.3  36.9  37.0   Platelets 150 - 400 K/uL 261  237  248        Latest Ref Rng & Units 01/07/2023    7:08 AM 01/06/2023    6:01 AM 01/05/2023   10:27 PM  CMP  Glucose 70 - 99 mg/dL 106  132    BUN 8 - 23 mg/dL 28  27    Creatinine 0.61 - 1.24 mg/dL 0.95  1.10    Sodium 135 - 145 mmol/L 139  141    Potassium 3.5 - 5.1 mmol/L 4.3  4.2  4.0   Chloride 98 - 111 mmol/L 114  112    CO2 22 - 32 mmol/L 21  20    Calcium 8.9 - 10.3  mg/dL 8.4  8.7    Total Protein 6.5 - 8.1 g/dL  6.4    Total Bilirubin 0.3 - 1.2 mg/dL  1.0    Alkaline Phos 38 - 126 U/L  125    AST 15 - 41 U/L  18    ALT 0 - 44 U/L  11        Microbiology:  Studies/Results: No results found.   Assessment/Plan: Sepsis secondary to complicated UTI from obstructive uropathy due to a stone on the left side causing hydronephrosis.  Status post stent placement.   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   Patient is currently on Unasyn. 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

## 2023-01-07 NOTE — TOC PASRR Note (Signed)
RE: Johnny Rice  Date of Birth: 08/08/2046 Date: 01/07/23      To Whom It May Concern:   Please be advised that the above-named patient will require a short-term nursing home stay - anticipated 30 days or less for rehabilitation and strengthening.  The plan is for return home

## 2023-01-07 NOTE — NC FL2 (Signed)
[place] LEVEL OF CARE FORM     IDENTIFICATION  Patient Name: Johnny Rice Birthdate: Jul 05, 1946 Sex: male Admission Date (Current Location): 01/05/2023  [facility] and [place] Number:  Engineering geologist and Address:         Provider Number: [id number]  Attending Physician Name and Address:  Wyvonnia Dusky, MD  Relative Name and Phone Number:       Current Level of Care:   Recommended Level of Care: [place] Prior Approval Number:    Date Approved/Denied:   PASRR Number: pending  Discharge Plan: SNF    Current Diagnoses: Patient Active Problem List   Diagnosis Date Noted   Acute metabolic encephalopathy 99991111   Tongue laceration 01/06/2023   Kidney stone 01/06/2023   Sinus bradycardia 01/06/2023   Chest discomfort 01/06/2023   Obstructive uropathy 01/05/2023   Severe sepsis ([place]) 01/05/2023   AKI (acute kidney injury) ([place]) 01/05/2023   UTI (urinary tract infection) 04/23/2022   Fall at home, initial encounter 04/23/2022   Scalp laceration 04/23/2022   HLD (hyperlipidemia) 04/23/2022   Depression with anxiety 04/23/2022   Hypothyroid 04/23/2022   Cystitis 04/03/2021   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   Acute cystitis without hematuria 04/02/2021   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   Demand ischemia of myocardium 01/16/2020   Severe visual impairment 10/18/2019   Alcohol use disorder, severe, 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[place]) 08/04/2019   History of stroke 06/11/2019   Severe sedative, hypnotic, 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   Frequent falls 12/19/2016   Anxiety disorder, unspecified 03/12/2016   Insomnia 02/11/2016   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   Neurocognitive disorder 09/07/2013   Osteoarthritis 07/11/2013   Mild cognitive impairment 06/28/2013   BPH (benign  prostatic hyperplasia) 01/24/2013   Depression 01/24/2013   Mixed hyperlipidemia 01/24/2013   Essential hypertension 01/24/2013   Hearing loss 09/02/2012   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    Orientation RESPIRATION BLADDER Height & Weight     Self, Situation, Place  Normal Incontinent Weight: 76 kg Height:  5\' 7"  (170.2 cm)  BEHAVIORAL SYMPTOMS/MOOD NEUROLOGICAL BOWEL NUTRITION STATUS      Continent Diet (Heart Healthy)  AMBULATORY STATUS COMMUNICATION OF NEEDS Skin   Total Care Verbally Normal                       Personal Care Assistance Level of Assistance              Functional Limitations Info             SPECIAL CARE FACTORS FREQUENCY  PT (By licensed PT), OT (By licensed OT)                    Contractures Contractures Info: Not present    Additional Factors Info  Code Status, Allergies Code Status Info: full Allergies Info: NKDA           Current Medications (01/07/2023):  This is the current hospital active medication list Current Facility-Administered Medications  Medication Dose Route Frequency Provider Last Rate Last Admin   0.9 %  sodium chloride infusion   Intravenous Continuous Loletha Grayer, MD 40 mL/hr at 01/07/23 1501 Infusion Verify at 01/07/23 1501   acetaminophen (TYLENOL) suppository 650 mg  650 mg Rectal Q4H PRN Vanessa Sargent, MD   650 mg at  01/05/23 0929   alfuzosin (UROXATRAL) 24 hr tablet 10 mg  10 mg Oral q AM Loletha Grayer, MD   10 mg at 01/07/23 0605   ampicillin (OMNIPEN) 2 g in sodium chloride 0.9 % 100 mL IVPB  2 g Intravenous Q4H Tsosie Billing, MD   Stopped at 01/07/23 1235   ARIPiprazole (ABILIFY) tablet 15 mg  15 mg Oral Daily Loletha Grayer, MD   15 mg at 01/07/23 [id number]   aspirin chewable tablet 81 mg  81 mg Oral Daily Loletha Grayer, MD   81 mg at 01/07/23 [id number]   atorvastatin (LIPITOR) tablet 20 mg  20 mg Oral QHS Loletha Grayer, MD   20 mg at 01/06/23 2115    clonazePAM (KLONOPIN) tablet 1 mg  1 mg Oral TID Wyvonnia Dusky, MD       enoxaparin (LOVENOX) injection 40 mg  40 mg Subcutaneous Q24H Deneise Lever, MD   40 mg at 01/06/23 2120   hydrOXYzine (ATARAX) tablet 50 mg  50 mg Oral Q6H PRN Loletha Grayer, MD   50 mg at 01/07/23 0942   lidocaine (XYLOCAINE) 2 % viscous mouth solution 15 mL  15 mL Mouth/Throat Q4H PRN Loletha Grayer, MD   15 mL at 01/07/23 1330   liothyronine (CYTOMEL) tablet 25 mcg  25 mcg Oral Daily Loletha Grayer, MD   25 mcg at 01/07/23 0823   lisinopril (ZESTRIL) tablet 40 mg  40 mg Oral Daily Loletha Grayer, MD   40 mg at 01/07/23 [id number]   nitroGLYCERIN (NITROSTAT) SL tablet 0.4 mg  0.4 mg Sublingual Q5 min PRN Loletha Grayer, MD   0.4 mg at 01/06/23 1117   oxyCODONE (Oxy IR/ROXICODONE) immediate release tablet 5 mg  5 mg Oral Q6H PRN Loletha Grayer, MD   5 mg at 01/07/23 0942   polyethylene glycol (MIRALAX / GLYCOLAX) packet 17 g  17 g Oral Gertie Gowda, Richard, MD   17 g at 01/06/23 1337   traZODone (DESYREL) tablet 50 mg  50 mg Oral QHS Loletha Grayer, MD   50 mg at 01/06/23 2115     Discharge Medications: Please see discharge summary for a list of discharge medications.  Relevant Imaging Results:  Relevant Lab Results:   Additional Information SS#: [id number]  Beverly Sessions, RN

## 2023-01-07 NOTE — TOC Initial Note (Signed)
Transition of Care [facility] [place]) - Initial/Assessment Note    Patient Details  Name: Johnny Rice MRN: [id number] Date of Birth: [birth year]  [facility] CM/SW Contact:    Beverly Sessions, RN Phone Number: 01/07/2023, 4:06 PM  Clinical Narrative:                  Patient with history of dementia.  Assessment completed via phone with daughter Johnny Rice  Admitted for: sepsis Admitted from: [facility] Current home health/prior home health/DME: [facility]   Therapy recommending SNF Daughter in agreement  [place] sent for signature  PASRR went to level 2 clinical submitted to [facility]       Patient Goals and CMS Choice            Expected Discharge Plan and Services                                              Prior Living Arrangements/Services                       Activities of Daily Living Home Assistive Devices/Equipment: Bedside commode/3-in-1, Hospital bed ADL Screening (condition at time of admission) Patient's cognitive ability adequate to safely complete daily activities?: No Is the patient deaf or have difficulty hearing?: Yes Does the patient have difficulty seeing, even when wearing glasses/contacts?: Yes Does the patient have difficulty concentrating, remembering, or making decisions?: Yes Patient able to express need for assistance with ADLs?: Yes Does the patient have difficulty dressing or bathing?: Yes Independently performs ADLs?: No Communication: Dependent Is this a change from baseline?: Change from baseline, expected to last >3 days Dressing (OT): Dependent Is this a change from baseline?: Change from baseline, expected to last >3 days Grooming: Dependent Is this a change from baseline?: Change from baseline, expected to last >3 days Feeding: Dependent Is this a change from baseline?: Change from baseline, expected to last >3 days Bathing: Dependent Is this a change from baseline?: Change from baseline, expected to last >3  days Toileting: Dependent Is this a change from baseline?: Change from baseline, expected to last >3days In/Out Bed: Dependent Is this a change from baseline?: Change from baseline, expected to last >3 days Walks in Home: Dependent Is this a change from baseline?: Change from baseline, expected to last >3 days Does the patient have difficulty walking or climbing stairs?: Yes Weakness of Legs: Both Weakness of Arms/Hands: Both  Permission Sought/Granted                  Emotional Assessment              Admission diagnosis:  Kidney stone [N20.0] Sepsis, due to unspecified organism, unspecified whether acute organ dysfunction present [facility]) [A41.9] Obstructive uropathy [N13.9] Patient Active Problem List   Diagnosis Date Noted   Acute metabolic encephalopathy 99991111   Tongue laceration 01/06/2023   Kidney stone 01/06/2023   Sinus bradycardia 01/06/2023   Chest discomfort 01/06/2023   Obstructive uropathy 01/05/2023   Severe sepsis ([place]) 01/05/2023   AKI (acute kidney injury) ([place]) 01/05/2023   UTI (urinary tract infection) 04/23/2022   Fall at home, initial encounter 04/23/2022   Scalp laceration 04/23/2022   HLD (hyperlipidemia) 04/23/2022   Depression with anxiety 04/23/2022   Hypothyroid 04/23/2022   Cystitis 04/03/2021   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  Acute cystitis without hematuria 04/02/2021   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   Demand ischemia of myocardium 01/16/2020   Severe visual impairment 10/18/2019   Alcohol use disorder, severe, 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[place]) 08/04/2019   History of stroke 06/11/2019   Severe sedative, hypnotic, 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   Frequent falls 12/19/2016   Anxiety disorder, unspecified 03/12/2016   Insomnia 02/11/2016   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[place]) 10/04/2013   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    Neurocognitive disorder 09/07/2013   Osteoarthritis 07/11/2013   Mild cognitive impairment 06/28/2013   BPH (benign prostatic hyperplasia) 01/24/2013   Depression 01/24/2013   Mixed hyperlipidemia 01/24/2013   Essential hypertension 01/24/2013   Hearing loss 09/02/2012   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   PCP:  Orvis Brill, [address], [place] [address] Phone: [phone] Fax: [phone]     Social Determinants of Health (SDOH) Social History: SDOH Screenings   Food Insecurity: Patient Unable To Answer (01/05/2023)  Housing: Low Risk  (01/05/2023)  Transportation Needs: Patient Unable To Answer (01/05/2023)  Utilities: Not At Risk (01/05/2023)  Tobacco Use: Low Risk  (01/06/2023)   SDOH Interventions:     Readmission Risk Interventions    01/07/2023    4:06 PM  Readmission Risk Prevention Plan  Transportation Screening Complete  PCP or Specialist appointment within 3-5 days of discharge Complete  HRI or [place] Complete  SW Recovery Care/Counseling Consult Complete  [place] Complete

## 2023-01-07 NOTE — Progress Notes (Signed)
PROGRESS NOTE    Johnny Rice  [id number] DOB: September 05, 1946 DOA: 01/05/2023 PCP: [facility]   Assessment & Plan:   Principal Problem:   Severe sepsis ([place]) Active Problems:   Obstructive uropathy   Acute metabolic encephalopathy   AKI (acute kidney injury) ([place])   History of stroke   BPH (benign prostatic hyperplasia)   Essential hypertension   Hypothyroid   Tongue laceration   Kidney stone   Sinus bradycardia   Chest discomfort  Assessment and Plan: Severe sepsis: present on admission. Met criteria w/ fever, tachycardia, leukocytosis, AKI, & bacteremia. Enterococcus growing out of blood cultures. Continue on IV ampicillin as per ID. Urine culture shows no growth    Bacteremia: blood cultures growing enterococcus. Likely secondary to urinary source even though urine cx is neg. ? if abxs were given prior to urine cx was collected. Continue on IV ampicillin. Repeat blood cxs are pending    Acute metabolic encephalopathy: able to answer questions appropriately today. Possibly secondary to above infection    Obstructive uropathy: CT scan showing a 7 mm obstructing stone at the left UVJ with hydronephrosis and perinephric stranding. S/p left ureteral stent placed by Dr. Erlene Quan on 3/18.  Continue on IV amipicillin as per ID    AKI: Cr is labile    Hx of CVA: continue on aspirin, statin    HTN: continue on lisinopril. Holding BB secondary to bradycardia   BPH: continue on alfuzosin    Hypothyroidism: continue liothyronine    Chest discomfort: likely not cardiac. Troponin neg x 2.    Sinus bradycardia: holding metoprolol    Tongue laceration: continue on viscous lidocaine      DVT prophylaxis: lovenox  Code Status: full  Family Communication: discussed pt's care w/ pt's daughter, [name], and answered her questions Disposition Plan:  possibly d/c to SNF   Level of care: Med-Surg  Status is: Inpatient      Consultants:  ID Urology    Procedures:   Antimicrobials: IV ampicillin    Subjective: Pt c/o dizziness   Objective: Vitals:   01/06/23 1619 01/06/23 2014 01/07/23 0257 01/07/23 0807  BP: 109/78 133/86 (!) 165/97 (!) 152/85  Pulse: 78 (!) 55 (!) 51 (!) 47  Resp: 16 16 18 18   Temp: 97.6 F (36.4 C) 97.9 F (36.6 C) 97.9 F (36.6 C) 98.7 F (37.1 C)  TempSrc:  Oral    SpO2: 96% 98% 99% 99%  Weight:      Height:        Intake/Output Summary (Last 24 hours) at 01/07/2023 0831 Last data filed at 01/07/2023 0600 Gross per 24 hour  Intake 649.81 ml  Output 810 ml  Net -160.19 ml   Filed Weights   01/05/23 1259  Weight: 76 kg    Examination:  General exam: Appears calm and comfortable  Respiratory system: Clear to auscultation. Respiratory effort normal. Cardiovascular system: S1 & S2 +. No rubs, gallops or clicks.  Gastrointestinal system: Abdomen is nondistended, soft and nontender.  Normal bowel sounds heard. Central nervous system: Alert and oriented. Moves all extremities  Psychiatry: Judgement and insight appear normal. Flat mood and affect     Data Reviewed: I have personally reviewed following labs and imaging studies  CBC: Recent Labs  Lab 01/05/23 0912 01/06/23 0601 01/07/23 0708  WBC 18.7* 17.3* 17.9*  NEUTROABS 16.6*  --   --   HGB 12.8* 12.3* 12.1*  HCT 37.0* 36.9* 36.3*  MCV 85.1 86.8 87.7  PLT 248  237 0000000   Basic Metabolic Panel: Recent Labs  Lab 01/05/23 0912 01/05/23 0930 01/05/23 2227 01/06/23 0601 01/07/23 0708  NA 139  --   --  141 139  K 3.3*  --  4.0 4.2 4.3  CL 108  --   --  112* 114*  CO2 20*  --   --  20* 21*  GLUCOSE 155*  --   --  132* 106*  BUN 25*  --   --  27* 28*  CREATININE 1.64*  --   --  1.10 0.95  CALCIUM 8.8*  --   --  8.7* 8.4*  MG  --  1.5*  --   --   --    GFR: Estimated Creatinine Clearance: 63.8 mL/min (by C-G formula based on SCr of 0.95 mg/dL). Liver Function Tests: Recent Labs  Lab 01/05/23 0912 01/06/23 0601  AST 25 18   ALT 13 11  ALKPHOS 162* 125  BILITOT 2.7* 1.0  PROT 7.3 6.4*  ALBUMIN 3.7 2.9*   No results for input(s): "LIPASE", "AMYLASE" in the last 168 hours. No results for input(s): "AMMONIA" in the last 168 hours. Coagulation Profile: Recent Labs  Lab 01/05/23 0912  INR 1.3*   Cardiac Enzymes: No results for input(s): "CKTOTAL", "CKMB", "CKMBINDEX", "TROPONINI" in the last 168 hours. BNP (last 3 results) No results for input(s): "PROBNP" in the last 8760 hours. HbA1C: No results for input(s): "HGBA1C" in the last 72 hours. CBG: No results for input(s): "GLUCAP" in the last 168 hours. Lipid Profile: No results for input(s): "CHOL", "HDL", "LDLCALC", "TRIG", "CHOLHDL", "LDLDIRECT" in the last 72 hours. Thyroid Function Tests: No results for input(s): "TSH", "T4TOTAL", "FREET4", "T3FREE", "THYROIDAB" in the last 72 hours. Anemia Panel: No results for input(s): "VITAMINB12", "FOLATE", "FERRITIN", "TIBC", "IRON", "RETICCTPCT" in the last 72 hours. Sepsis Labs: Recent Labs  Lab 01/05/23 0912 01/05/23 1125  LATICACIDVEN 1.8 1.1    Recent Results (from the past 240 hour(s))  Resp panel by RT-PCR (RSV, Flu A&B, Covid) Anterior Nasal Swab     Status: None   Collection Time: 01/05/23  9:10 AM   Specimen: Anterior Nasal Swab  Result Value Ref Range Status   SARS Coronavirus 2 by RT PCR NEGATIVE NEGATIVE Final    Comment: (NOTE) SARS-CoV-2 target nucleic acids are NOT DETECTED.  The SARS-CoV-2 RNA is generally detectable in upper respiratory specimens during the acute phase of infection. The lowest concentration of SARS-CoV-2 viral copies this assay can detect is 138 copies/mL. A negative result does not preclude SARS-Cov-2 infection and should not be used as the sole basis for treatment or other patient management decisions. A negative result may occur with  improper specimen collection/handling, submission of specimen other than nasopharyngeal swab, presence of viral mutation(s)  within the areas targeted by this assay, and inadequate number of viral copies(<138 copies/mL). A negative result must be combined with clinical observations, patient history, and epidemiological information. The expected result is Negative.  Fact Sheet for Patients:  EntrepreneurPulse.com.au  Fact Sheet for Healthcare Providers:  IncredibleEmployment.be  This test is no t yet approved or cleared by the [place] FDA and  has been authorized for detection and/or diagnosis of SARS-CoV-2 by FDA under an Emergency Use Authorization (EUA). This EUA will remain  in effect (meaning this test can be used) for the duration of the COVID-19 declaration under Section 564(b)(1) of the Act, 21 U.S.C.section 360bbb-3(b)(1), unless the authorization is terminated  or revoked sooner.  Influenza A by PCR NEGATIVE NEGATIVE Final   Influenza B by PCR NEGATIVE NEGATIVE Final    Comment: (NOTE) The Xpert Xpress SARS-CoV-2/FLU/RSV plus assay is intended as an aid in the diagnosis of influenza from Nasopharyngeal swab specimens and should not be used as a sole basis for treatment. Nasal washings and aspirates are unacceptable for Xpert Xpress SARS-CoV-2/FLU/RSV testing.  Fact Sheet for Patients: EntrepreneurPulse.com.au  Fact Sheet for Healthcare Providers: IncredibleEmployment.be  This test is not yet approved or cleared by the [place] FDA and has been authorized for detection and/or diagnosis of SARS-CoV-2 by FDA under an Emergency Use Authorization (EUA). This EUA will remain in effect (meaning this test can be used) for the duration of the COVID-19 declaration under Section 564(b)(1) of the Act, 21 U.S.C. section 360bbb-3(b)(1), unless the authorization is terminated or revoked.     Resp Syncytial Virus by PCR NEGATIVE NEGATIVE Final    Comment: (NOTE) Fact Sheet for  Patients: EntrepreneurPulse.com.au  Fact Sheet for Healthcare Providers: IncredibleEmployment.be  This test is not yet approved or cleared by the [place] FDA and has been authorized for detection and/or diagnosis of SARS-CoV-2 by FDA under an Emergency Use Authorization (EUA). This EUA will remain in effect (meaning this test can be used) for the duration of the COVID-19 declaration under Section 564(b)(1) of the Act, 21 U.S.C. section 360bbb-3(b)(1), unless the authorization is terminated or revoked.  Performed at [facility], [address] [place], [place] [address]   Blood Culture (routine x 2)     Status: None (Preliminary result)   Collection Time: 01/05/23  9:30 AM   Specimen: BLOOD  Result Value Ref Range Status   Specimen Description   Final    BLOOD LEFT ANTECUBITAL Performed at [facility], [address] [place], [place] [address]    Special Requests   Final    BOTTLES DRAWN AEROBIC AND ANAEROBIC Blood Culture results may not be optimal due to an excessive volume of blood received in culture bottles Performed at [facility], [address] [place], [place] [address]    Culture  Setup Time   Final    GRAM POSITIVE COCCI AEROBIC BOTTLE ONLY CRITICAL VALUE NOTED.  VALUE IS CONSISTENT WITH PREVIOUSLY REPORTED AND CALLED VALUE. Performed at [facility], [address] [place], [place] [address]    Culture   Final    NO GROWTH 2 DAYS Performed at [facility], [place] [address], [place], [place] [address]    Report Status PENDING  Incomplete  Blood Culture (routine x 2)     Status: None (Preliminary result)   Collection Time: 01/05/23  9:30 AM   Specimen: BLOOD  Result Value Ref Range Status   Specimen Description BLOOD RIGHT ANTECUBITAL  Final   Special Requests   Final    BOTTLES DRAWN AEROBIC AND ANAEROBIC Blood Culture results may not be optimal due to an excessive  volume of blood received in culture bottles   Culture  Setup Time   Final    GRAM POSITIVE COCCI IN BOTH AEROBIC AND ANAEROBIC BOTTLES Organism ID to follow CRITICAL RESULT CALLED TO, READ BACK BY AND VERIFIED WITH: NATHAN BLUE@[email or web address]  01/06/23 RH Performed at [facility], [address] [place], [place] [address]    Culture [facility] POSITIVE COCCI  Final   Report Status PENDING  Incomplete  Blood Culture ID Panel (Reflexed)     Status: Abnormal   Collection Time: 01/05/23  9:30 AM  Result Value Ref Range Status   Enterococcus faecalis DETECTED (A) NOT DETECTED Final    Comment: CRITICAL RESULT CALLED TO, READ BACK BY AND VERIFIED WITH: NATHAN BLUE@0529  01/06/23 RH    Enterococcus Faecium NOT DETECTED NOT DETECTED Final   Listeria monocytogenes NOT DETECTED NOT DETECTED Final   Staphylococcus species NOT DETECTED NOT DETECTED Final   Staphylococcus aureus (BCID) NOT DETECTED NOT DETECTED Final   Staphylococcus epidermidis NOT DETECTED NOT DETECTED Final   Staphylococcus lugdunensis NOT DETECTED NOT DETECTED Final   Streptococcus species NOT DETECTED NOT DETECTED Final   Streptococcus agalactiae NOT DETECTED NOT DETECTED Final   Streptococcus pneumoniae NOT DETECTED NOT DETECTED Final   Streptococcus pyogenes NOT DETECTED NOT DETECTED Final   A.calcoaceticus-baumannii NOT DETECTED NOT DETECTED Final   Bacteroides fragilis NOT DETECTED NOT DETECTED Final   Enterobacterales NOT DETECTED NOT DETECTED Final   Enterobacter cloacae complex NOT DETECTED NOT DETECTED Final   Escherichia coli NOT DETECTED NOT DETECTED Final   Klebsiella aerogenes NOT DETECTED NOT DETECTED Final   Klebsiella oxytoca NOT DETECTED NOT DETECTED Final   Klebsiella pneumoniae NOT DETECTED NOT DETECTED Final   Proteus species NOT DETECTED NOT DETECTED Final   Salmonella species NOT DETECTED NOT DETECTED Final   Serratia marcescens NOT DETECTED NOT DETECTED Final   Haemophilus influenzae NOT DETECTED NOT  DETECTED Final   Neisseria meningitidis NOT DETECTED NOT DETECTED Final   Pseudomonas aeruginosa NOT DETECTED NOT DETECTED Final   Stenotrophomonas maltophilia NOT DETECTED NOT DETECTED Final   Candida albicans NOT DETECTED NOT DETECTED Final   Candida auris NOT DETECTED NOT DETECTED Final   Candida glabrata NOT DETECTED NOT DETECTED Final   Candida krusei NOT DETECTED NOT DETECTED Final   Candida parapsilosis NOT DETECTED NOT DETECTED Final   Candida tropicalis NOT DETECTED NOT DETECTED Final   Cryptococcus neoformans/gattii NOT DETECTED NOT DETECTED Final   Vancomycin resistance NOT DETECTED NOT DETECTED Final    Comment: Performed at [facility], [address], [place], [place] [address]  Urine Culture     Status: None   Collection Time: 01/05/23  1:59 PM   Specimen: Urine, Random  Result Value Ref Range Status   Specimen Description   Final    URINE, RANDOM Performed at [facility], [address], [place], [place] [address]    Special Requests LEFT URETER  Final   Culture   Final    NO GROWTH Performed at [facility], [address] [place], [place] [address]    Report Status 01/06/2023 FINAL  Final         Radiology Studies: DG OR UROLOGY CYSTO IMAGE [facility] ONLY)  Result Date: 01/05/2023 There is no interpretation for this exam.  This order is for images obtained during a surgical procedure.  Please See "Surgeries" Tab for more information regarding the procedure.   CT CHEST ABDOMEN PELVIS WO CONTRAST  Result Date: 01/05/2023 CLINICAL DATA:  Sepsis EXAM: CT CHEST, ABDOMEN AND PELVIS WITHOUT CONTRAST TECHNIQUE: Multidetector CT imaging of the chest, abdomen and pelvis was performed following the standard protocol without IV contrast. RADIATION DOSE REDUCTION: This exam was performed according to the departmental dose-optimization program which includes automated exposure control, adjustment of the mA and/or kV according to patient size and/or  use of iterative reconstruction technique. COMPARISON:  Same day chest radiograph, CT abdomen/pelvis 10/05/2021 FINDINGS: CT CHEST FINDINGS Cardiovascular: The heart is at the upper limits of normal for size. There is no pericardial effusion there  is mild calcified plaque in the imaged aortic arch. Mediastinum/Nodes: No thyroid tissue is identified. The esophagus is grossly unremarkable. There is no mediastinal or axillary lymphadenopathy. There is no bulky hilar adenopathy, within the confines of noncontrast technique. Lungs/Pleura: The trachea and central airways are patent. Lung volumes are low. Reticular opacities in the lung bases, lingula, and subpleural right upper lobe likely reflects scarring. There is a trace right pleural effusion. There is no left effusion. There is no pneumothorax There are no suspicious nodules. Musculoskeletal: There is no acute osseous abnormality or suspicious osseous lesion. CT ABDOMEN PELVIS FINDINGS Hepatobiliary: The liver and gallbladder are unremarkable. There is no biliary ductal dilatation. Pancreas: Unremarkable. Spleen: Unremarkable. Adrenals/Urinary Tract: The adrenals are unremarkable. There is a 7 mm stone at the left UPJ with upstream hydronephrosis and asymmetric perinephric stranding. Superimposed infection is not excluded. There is no organized perinephric fluid collection. There are no definite suspicious renal lesions, within the confines of noncontrast technique. No other stones are seen in either kidney or along the course of either ureter. There is no hydronephrosis or hydroureter on the right. The bladder is unremarkable. Stomach/Bowel: The stomach is unremarkable. There is no evidence of bowel obstruction. There is no abnormal bowel wall thickening or inflammatory change. There is colonic diverticulosis without evidence of acute diverticulitis. The appendix is not identified. Vascular/Lymphatic: There is mild calcified plaque in the nonaneurysmal abdominal  aorta. There is no abdominal or pelvic lymphadenopathy. Reproductive: The prostate and seminal vesicles are unremarkable. Other: There is no ascites or free air. There is a small fat containing left inguinal hernia. Musculoskeletal: There is no acute osseous abnormality or suspicious osseous lesion. There is multilevel degenerative change of the lumbar spine, most advanced at L3-L4 and L4-L5. IMPRESSION: 1. 7 mm obstructing stone at the left UPJ with upstream hydronephrosis and asymmetric perinephric stranding. Superimposed infection not excluded 2. Trace right pleural effusion. No other acute finding in the chest. Electronically Signed   By: Valetta Mole M.D.   On: 01/05/2023 11:01   CT HEAD WO CONTRAST (5MM)  Result Date: 01/05/2023 CLINICAL DATA:  Minor head trauma EXAM: CT HEAD WITHOUT CONTRAST TECHNIQUE: Contiguous axial images were obtained from the base of the skull through the vertex without intravenous contrast. RADIATION DOSE REDUCTION: This exam was performed according to the departmental dose-optimization program which includes automated exposure control, adjustment of the mA and/or kV according to patient size and/or use of iterative reconstruction technique. COMPARISON:  07/09/2022 FINDINGS: Brain: Remote right PCA territory infarct affecting temporal and occipital cortex and extending into the parietal lobe. Cerebral volume loss with ventriculomegaly that is unchanged. No evidence of acute infarct, hemorrhage, obstructive hydrocephalus, or mass. Vascular: No hyperdense vessel or unexpected calcification. Skull: Negative Sinuses/Orbits: Changes of left mastoidectomy with partial opacification. Presumably related gold weight in the left orbit. IMPRESSION: 1. No evidence of intracranial injury. 2. 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 Electronically Signed   By: Jorje Guild M.D.   On: 01/05/2023 10:48   DG Chest Port 1 View  Result Date: 01/05/2023 CLINICAL DATA:  Sepsis  EXAM: PORTABLE CHEST 1 VIEW COMPARISON:  11/16/2022 FINDINGS: Stable cardiomediastinal contours. Low lung volumes with streaky bibasilar opacities. No pleural effusion. No pneumothorax. IMPRESSION: Low lung volumes with streaky bibasilar opacities, likely atelectasis. Electronically Signed   By: Davina Poke D.O.   On: 01/05/2023 09:57        Scheduled Meds:  alfuzosin  10 mg Oral q AM   ARIPiprazole  15 mg Oral Daily   aspirin  81 mg Oral Daily   atorvastatin  20 mg Oral QHS   enoxaparin (LOVENOX) injection  40 mg Subcutaneous Q24H   liothyronine  25 mcg Oral Daily   lisinopril  40 mg Oral Daily   polyethylene glycol  17 g Oral QODAY   traZODone  50 mg Oral QHS   Continuous Infusions:  sodium chloride 40 mL/hr at 01/06/23 1143   ampicillin (OMNIPEN) IV 2 g (01/07/23 0822)     LOS: 2 days    Time spent: 35 mins    Wyvonnia Dusky, MD [facility] Pager [phone]  If 7PM-7AM, please contact night-coverage [email or web address] 01/07/2023, 8:31 AM

## 2023-01-07 NOTE — Progress Notes (Signed)
Initial Nutrition Assessment  DOCUMENTATION CODES:   Not applicable  INTERVENTION:   Ensure Enlive po TID, each supplement provides 350 kcal and 20 grams of protein.  MVI po daily   Dysphagia 2 diet   Pt at high refeed risk; recommend monitor potassium, magnesium and phosphorus labs daily until stable  NUTRITION DIAGNOSIS:   Inadequate oral intake related to acute illness as evidenced by meal completion < 25%.  GOAL:   Patient will meet greater than or equal to 90% of their needs  MONITOR:   PO intake, Supplement acceptance, Labs, Weight trends, I & O's, Skin  REASON FOR ASSESSMENT:   Malnutrition Screening Tool    ASSESSMENT:   77 y/o male with h/o BPH, CVA, HTN, HLD, hypothyroidsm, etoh abuse, depression and anxiety who is admitted with bacteremia and sepsis secondary to obstructive uropathys /p left ureteral stent 3/18.  Met with pt in room today. Pt reports good appetite and oral intake at baseline but reports poor oral intake in hospital. Pt is documented to be eating sips/bites of meals. Pt reports that he fell and lacerated his tongue and that his mouth is sore and he has been unable to eat much. RD discussed with pt the importance of adequate nutrition needed to preserve lean muscle. Pt is willing to drink chocolate Ensure in hospital. Pt is requesting a ground diet for now until his mouth pain is improved; RD will add dysphagia 2 diet. RD will add supplements and MVI to help pt meet his estimated needs. Pt is at refeed risk. Per chart, pt appears weight stable at baseline.   Medications reviewed and include: aspirin, lovenox, miralax, NaCl @40ml /hr, omnipen   Labs reviewed: K 4.3 wnl, BUN 28(H) Wbc- 17.9(H)  NUTRITION - FOCUSED PHYSICAL EXAM:  Flowsheet Row Most Recent Value  Orbital Region No depletion  Upper Arm Region No depletion  Thoracic and Lumbar Region No depletion  Buccal Region No depletion  Temple Region No depletion  Clavicle Bone Region No  depletion  Clavicle and Acromion Bone Region No depletion  Scapular Bone Region No depletion  Dorsal Hand No depletion  Patellar Region No depletion  Anterior Thigh Region No depletion  Posterior Calf Region No depletion  Edema (RD Assessment) None  Hair Reviewed  Eyes Reviewed  Mouth Reviewed  Skin Reviewed  Nails Reviewed   Diet Order:   Diet Order             DIET DYS 2 Room service appropriate? Yes; Fluid consistency: Thin  Diet effective now                  EDUCATION NEEDS:   Education needs have been addressed  Skin:  Skin Assessment: Reviewed RN Assessment  Last BM:  pta  Height:   Ht Readings from Last 1 Encounters:  01/05/23 5\' 7"  (1.702 m)    Weight:   Wt Readings from Last 1 Encounters:  01/05/23 76 kg    Ideal Body Weight:  67.2 kg  BMI:  Body mass index is 26.24 kg/m.  Estimated Nutritional Needs:   Kcal:  1800-2100kcal/day  Protein:  90-105g/day  Fluid:  1.7-2.0L/day  Koleen Distance MS, RD, LDN Please refer to [facility] for RD and/or RD on-call/weekend/after hours pager

## 2023-01-08 DIAGNOSIS — A419 Sepsis, unspecified organism: Secondary | ICD-10-CM | POA: Diagnosis not present

## 2023-01-08 DIAGNOSIS — N200 Calculus of kidney: Secondary | ICD-10-CM | POA: Diagnosis not present

## 2023-01-08 DIAGNOSIS — B952 Enterococcus as the cause of diseases classified elsewhere: Secondary | ICD-10-CM | POA: Diagnosis not present

## 2023-01-08 DIAGNOSIS — N390 Urinary tract infection, site not specified: Secondary | ICD-10-CM | POA: Diagnosis not present

## 2023-01-08 DIAGNOSIS — R6520 Severe sepsis without septic shock: Secondary | ICD-10-CM | POA: Diagnosis not present

## 2023-01-08 DIAGNOSIS — N139 Obstructive and reflux uropathy, unspecified: Secondary | ICD-10-CM | POA: Diagnosis not present

## 2023-01-08 LAB — CULTURE, BLOOD (ROUTINE X 2)

## 2023-01-08 LAB — BASIC METABOLIC PANEL
Anion gap: 9 (ref 5–15)
BUN: 20 mg/dL (ref 8–23)
CO2: 23 mmol/L (ref 22–32)
Calcium: 8.5 mg/dL — ABNORMAL LOW (ref 8.9–10.3)
Chloride: 109 mmol/L (ref 98–111)
Creatinine, Ser: 0.93 mg/dL (ref 0.61–1.24)
GFR, Estimated: >60 mL/min (ref >60)
Glucose, Bld: 89 mg/dL (ref 70–99)
Potassium: 3.6 mmol/L (ref 3.5–5.1)
Sodium: 141 mmol/L (ref 135–145)

## 2023-01-08 LAB — CBC
HCT: 34.6 % — ABNORMAL LOW (ref 39.0–52.0)
Hemoglobin: 11.6 g/dL — ABNORMAL LOW (ref 13.0–17.0)
MCH: 29.2 pg (ref 26.0–34.0)
MCHC: 33.5 g/dL (ref 30.0–36.0)
MCV: 87.2 fL (ref 80.0–100.0)
Platelets: 264 10*3/uL (ref 150–400)
RBC: 3.97 MIL/uL — ABNORMAL LOW (ref 4.22–5.81)
RDW: 14.7 % (ref 11.5–15.5)
WBC: 11.8 10*3/uL — ABNORMAL HIGH (ref 4.0–10.5)
nRBC: 0 % (ref 0.0–0.2)

## 2023-01-08 MED ORDER — ACETAMINOPHEN 325 MG PO TABS
650.0000 mg | ORAL_TABLET | Freq: Four times a day (QID) | ORAL | Status: DC | PRN
  Administered 2023-01-08 – 2023-01-16 (×15): 650 mg via ORAL
  Filled 2023-01-08 (×18): qty 2

## 2023-01-08 NOTE — TOC Progression Note (Signed)
Transition of Care [facility]) - Progression Note    Patient Details  Name: Johnny Rice MRN: [id number] Date of Birth: 02/14/1946  [facility] CM/SW Contact  Beverly Sessions, RN Phone Number: 01/08/2023, 10:35 AM  Clinical Narrative:     PASRR obtained [id number]. Valid through 4/20       Expected Discharge Plan and Services                                               Social Determinants of Health (SDOH) Interventions SDOH Screenings   Food Insecurity: Patient Unable To Answer (01/05/2023)  Housing: Low Risk  (01/05/2023)  Transportation Needs: Patient Unable To Answer (01/05/2023)  Utilities: Not At Risk (01/05/2023)  Tobacco Use: Low Risk  (01/06/2023)    Readmission Risk Interventions    01/07/2023    4:06 PM  Readmission Risk Prevention Plan  Transportation Screening Complete  PCP or Specialist appointment within 3-5 days of discharge Complete  HRI or [place] Complete  SW Recovery Care/Counseling Consult Complete  [place] Complete

## 2023-01-08 NOTE — Progress Notes (Signed)
PT Cancellation Note  Patient Details Name: Johnny Rice MRN: [id number] DOB: April 29, 1946   Cancelled Treatment:     Pt resting in bed, lunch at bedside untouched. Pt refused PT, stating he didn't feel well because his adenoids were bothering him. Nursing notified. Pt remains very confused. Will re-attempt next available date/time.    Josie Dixon 01/08/2023, 1:31 PM

## 2023-01-08 NOTE — Progress Notes (Deleted)
PT Cancellation Note  Patient Details Name: Johnny Rice MRN: [id number] DOB: [birth year]   Cancelled Treatment:     Pt being transferred to ICU for Bipap needs due to high level of CO2 (per family). Will continue PT as appropriate, new orders or continuation of services appreciated.   Josie Dixon 01/08/2023, 1:04 PM

## 2023-01-08 NOTE — NC FL2 (Signed)
[place] LEVEL OF CARE FORM     IDENTIFICATION  Patient Name: Johnny Rice Birthdate: Sep 27, 1946 Sex: male Admission Date (Current Location): 01/05/2023  [facility] and [place] Number:  Engineering geologist and Address:  [facility], [address] [place], [place] [address]      Provider Number: [id number]  Attending Physician Name and Address:  Wyvonnia Dusky, MD  Relative Name and Phone Number:       Current Level of Care: Hospital Recommended Level of Care: [place] Prior Approval Number:    Date Approved/Denied:   PASRR Number: pending  Discharge Plan: SNF    Current Diagnoses: Patient Active Problem List   Diagnosis Date Noted   Bacteremia due to Enterococcus [id number]   Acute metabolic encephalopathy [id number]   Tongue laceration 01/06/2023   Kidney stone 01/06/2023   Sinus bradycardia 01/06/2023   Chest discomfort 01/06/2023   Obstructive uropathy 01/05/2023   Severe sepsis ([place]) 01/05/2023   AKI (acute kidney injury) ([place]) [id number]   Complicated UTI (urinary tract infection) 04/23/2022   Fall at home, initial encounter 04/23/2022   Scalp laceration 04/23/2022   HLD (hyperlipidemia) 04/23/2022   Depression with anxiety 04/23/2022   Hypothyroid 04/23/2022   Cystitis 04/03/2021   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/02/2021   Acute cystitis without hematuria 04/02/2021   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   Demand ischemia of myocardium 01/16/2020   Severe visual impairment 10/18/2019   Alcohol use disorder, severe, 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[place]) 08/04/2019   History of stroke 06/11/2019   Severe sedative, hypnotic, 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   Frequent falls 12/19/2016   Anxiety disorder, unspecified 03/12/2016   Insomnia 02/11/2016   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   Neurocognitive disorder 09/07/2013   Osteoarthritis 07/11/2013   Mild cognitive impairment 06/28/2013   BPH (benign prostatic hyperplasia) 01/24/2013   Depression 01/24/2013   Mixed hyperlipidemia 01/24/2013   Essential hypertension 01/24/2013   Hearing loss 09/02/2012   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    Orientation RESPIRATION BLADDER Height & Weight     Self, Situation, Place  Normal Incontinent Weight: 81.5 kg Height:  5\' 7"  (170.2 cm)  BEHAVIORAL SYMPTOMS/MOOD NEUROLOGICAL BOWEL NUTRITION STATUS      Continent Diet (Heart Healthy)  AMBULATORY STATUS COMMUNICATION OF NEEDS Skin   Total Care Verbally Normal                       Personal Care Assistance Level of Assistance              Functional Limitations Info             SPECIAL CARE FACTORS FREQUENCY  PT (By licensed PT), OT (By licensed OT)                    Contractures Contractures Info: Not present    Additional Factors Info  Code Status, Allergies Code Status Info: full Allergies Info: NKDA           Current Medications (01/08/2023):  This is the current hospital active medication list Current Facility-Administered Medications  Medication Dose Route Frequency Provider Last Rate Last Admin   0.9 %  sodium chloride infusion   Intravenous Continuous Loletha Grayer, MD 40 mL/hr at 01/07/23 1821 New Bag at 01/07/23 1821   acetaminophen (TYLENOL) suppository 650  mg  650 mg Rectal Q4H PRN Vanessa Betsy [place], MD   650 mg at 01/05/23 [id number]   alfuzosin (UROXATRAL) 24 hr tablet 10 mg  10 mg Oral q AM Loletha Grayer, MD   10 mg at 01/08/23 [id number]   ampicillin (OMNIPEN) 2 g in sodium chloride 0.9 % 100 mL IVPB  2 g Intravenous Q4H Tsosie Billing, MD 300 mL/hr at 01/08/23 0844 2 g at 01/08/23 0844   ARIPiprazole (ABILIFY) tablet 15 mg  15 mg Oral Daily Loletha Grayer, MD   15 mg at 01/07/23 [id number]   aspirin chewable tablet 81 mg  81 mg Oral  Daily Loletha Grayer, MD   81 mg at 01/07/23 [id number]   atorvastatin (LIPITOR) tablet 20 mg  20 mg Oral QHS Loletha Grayer, MD   20 mg at 01/07/23 2114   clonazePAM (KLONOPIN) tablet 1 mg  1 mg Oral TID Wyvonnia Dusky, MD   1 mg at 01/07/23 2114   enoxaparin (LOVENOX) injection 40 mg  40 mg Subcutaneous Q24H Deneise Lever, MD   40 mg at 01/07/23 2115   feeding supplement (ENSURE ENLIVE / ENSURE PLUS) liquid 237 mL  237 mL Oral TID BM Wyvonnia Dusky, MD       hydrOXYzine (ATARAX) tablet 50 mg  50 mg Oral Q6H PRN Loletha Grayer, MD   50 mg at 01/08/23 0343   lidocaine (XYLOCAINE) 2 % viscous mouth solution 15 mL  15 mL Mouth/Throat Q4H PRN Loletha Grayer, MD   15 mL at 01/07/23 1330   liothyronine (CYTOMEL) tablet 25 mcg  25 mcg Oral Daily Loletha Grayer, MD   25 mcg at 01/07/23 0823   lisinopril (ZESTRIL) tablet 40 mg  40 mg Oral Daily Loletha Grayer, MD   40 mg at 01/07/23 [id number]   multivitamin with minerals tablet 1 tablet  1 tablet Oral Daily Wyvonnia Dusky, MD       nitroGLYCERIN (NITROSTAT) SL tablet 0.4 mg  0.4 mg Sublingual Q5 min PRN Loletha Grayer, MD   0.4 mg at 01/06/23 1117   oxyCODONE (Oxy IR/ROXICODONE) immediate release tablet 5 mg  5 mg Oral Q6H PRN Loletha Grayer, MD   5 mg at 01/08/23 [id number]   polyethylene glycol (MIRALAX / GLYCOLAX) packet 17 g  17 g Oral Sabino Donovan, MD   17 g at 01/06/23 1337   traZODone (DESYREL) tablet 50 mg  50 mg Oral QHS Loletha Grayer, MD   50 mg at 01/07/23 2114     Discharge Medications: Please see discharge summary for a list of discharge medications.  Relevant Imaging Results:  Relevant Lab Results:   Additional Information SS#: [id number]  Beverly Sessions, RN

## 2023-01-08 NOTE — Progress Notes (Signed)
Date of Admission:  01/05/2023      ID: Johnny Rice is a 77 y.o. male  Principal Problem:   Severe sepsis ([place]) Active Problems:   BPH (benign prostatic hyperplasia)   History of stroke   Essential hypertension   Complicated UTI (urinary tract infection)   Hypothyroid   Obstructive uropathy   AKI (acute kidney injury) ([place])   Acute metabolic encephalopathy   Tongue laceration   Kidney stone   Sinus bradycardia   Chest discomfort   Bacteremia due to Enterococcus    Subjective: Says he is feeling a little better  Medications:   alfuzosin  10 mg Oral q AM   ARIPiprazole  15 mg Oral Daily   aspirin  81 mg Oral Daily   atorvastatin  20 mg Oral QHS   clonazePAM  1 mg Oral TID   enoxaparin (LOVENOX) injection  40 mg Subcutaneous Q24H   feeding supplement  237 mL Oral TID BM   liothyronine  25 mcg Oral Daily   lisinopril  40 mg Oral Daily   multivitamin with minerals  1 tablet Oral Daily   polyethylene glycol  17 g Oral QODAY   traZODone  50 mg Oral QHS    Objective: Vital signs in last 24 hours: Patient Vitals for the past 24 hrs:  BP Temp Temp src Pulse Resp SpO2 Weight  01/08/23 0825 (!) 157/84 98.2 F (36.8 C) -- 70 20 97 % --  01/08/23 0500 -- -- -- -- -- -- 81.5 kg  01/08/23 0342 (!) 168/80 100.1 F (37.8 C) Oral 72 18 96 % --  01/07/23 2018 (!) 165/78 98.2 F (36.8 C) Oral 60 18 98 % --  01/07/23 1530 136/89 98.2 F (36.8 C) -- (!) 55 17 93 % --       PHYSICAL EXAM: resting no distress Chest b/l air entry Hss1s2 Abd soft Lymph: Cervical, supraclavicular normal. Neurologic: Grossly non-focal  Lab Results    Latest Ref Rng & Units 01/08/2023    5:28 AM 01/07/2023    7:08 AM 01/06/2023    6:01 AM  CBC  WBC 4.0 - 10.5 K/uL 11.8  17.9  17.3   Hemoglobin 13.0 - 17.0 g/dL 11.6  12.1  12.3   Hematocrit 39.0 - 52.0 % 34.6  36.3  36.9   Platelets 150 - 400 K/uL 264  261  237        Latest Ref Rng & Units 01/08/2023    5:28 AM 01/07/2023    7:08 AM  01/06/2023    6:01 AM  CMP  Glucose 70 - 99 mg/dL 89  106  132   BUN 8 - 23 mg/dL 20  28  27    Creatinine 0.61 - 1.24 mg/dL 0.93  0.95  1.10   Sodium 135 - 145 mmol/L 141  139  141   Potassium 3.5 - 5.1 mmol/L 3.6  4.3  4.2   Chloride 98 - 111 mmol/L 109  114  112   CO2 22 - 32 mmol/L 23  21  20    Calcium 8.9 - 10.3 mg/dL 8.5  8.4  8.7   Total Protein 6.5 - 8.1 g/dL   6.4   Total Bilirubin 0.3 - 1.2 mg/dL   1.0   Alkaline Phos 38 - 126 U/L   125   AST 15 - 41 U/L   18   ALT 0 - 44 U/L   11       Microbiology: Enterococcus bacteremia 01/07/23  BCNG   Assessment/Plan: Sepsis secondary to complicated UTI from obstructive uropathy due to a stone on the left side causing hydronephrosis.  Status post stent placement.   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  bladder scan to look for any incomplete emptying.   Patient is currently on Unasyn. 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

## 2023-01-08 NOTE — Progress Notes (Signed)
PROGRESS NOTE    Johnny Rice  [id number] DOB: [birth year] DOA: 01/05/2023 PCP: [facility]   Assessment & Plan:   Principal Problem:   Severe sepsis ([place]) Active Problems:   Complicated UTI (urinary tract infection)   Obstructive uropathy   Acute metabolic encephalopathy   AKI (acute kidney injury) ([place])   History of stroke   BPH (benign prostatic hyperplasia)   Essential hypertension   Hypothyroid   Tongue laceration   Kidney stone   Sinus bradycardia   Chest discomfort   Bacteremia due to Enterococcus  Assessment and Plan: Severe sepsis: present on admission. Met criteria w/ fever, tachycardia, leukocytosis, AKI, & bacteremia. Blood cxs growing enterococcus. Continue on IV ampicillin as per ID. Urine culture shows no growth    Bacteremia: blood cultures growing enterococcus. Likely secondary to urinary source even though urine cx is neg. ? if abxs were given prior to urine cx was collected. Continue on IV amp as per ID. Repeat blood cxs NGTD    Acute metabolic encephalopathy: able to answer questions appropriately today. Possibly secondary to above infection    Obstructive uropathy: CT scan showing a 7 mm obstructing stone at the left UVJ with hydronephrosis and perinephric stranding. S/p left ureteral stent placed by Dr. Erlene Quan on 3/18.  Continue on IV amp as per ID   Dementia: continue w/ supportive care. Sitter prn    AKI: Cr is labile    Hx of CVA: continue on aspirin, statin    HTN: continue on lisinopril. Hold metoprolol secondary to bradycardia  BPH: continue on alfuzosin  Hypothyroidism: continue on liothyronine    Chest discomfort: likely not cardiac. Troponin neg x 2.    Sinus bradycardia: continue to hold metoprolol    Tongue laceration: continue w/ viscous lidocaine        DVT prophylaxis: lovenox  Code Status: full  Family Communication: called pt's daughter, Myra, no answer so I left a voicemail  Disposition Plan:   possibly d/c to SNF   Level of care: Med-Surg  Status is: Inpatient      Consultants:  ID Urology   Procedures:   Antimicrobials: IV ampicillin    Subjective: Pt c/o malaise  Objective: Vitals:   01/07/23 1530 01/07/23 2018 01/08/23 0342 01/08/23 0500  BP: 136/89 (!) 165/78 (!) 168/80   Pulse: (!) 55 60 72   Resp: 17 18 18    Temp: 98.2 F (36.8 C) 98.2 F (36.8 C) 100.1 F (37.8 C)   TempSrc:  Oral Oral   SpO2: 93% 98% 96%   Weight:    81.5 kg  Height:        Intake/Output Summary (Last 24 hours) at 01/08/2023 0807 Last data filed at 01/08/2023 0600 Gross per 24 hour  Intake 2067.12 ml  Output 1600 ml  Net 467.12 ml   Filed Weights   01/05/23 1259 01/08/23 0500  Weight: 76 kg 81.5 kg    Examination:  General exam: Appears comfortable  Respiratory system: clear breath sounds b/l Cardiovascular system: S1/S2+. No rubs or clicks  Gastrointestinal system: Abd is soft, NT, ND & hypoactive bowel sounds Central nervous system: Alert and awake. Moves all extremities  Psychiatry: Judgement and insight appears poor. Flat mood and affect    Data Reviewed: I have personally reviewed following labs and imaging studies  CBC: Recent Labs  Lab 01/05/23 0912 01/06/23 0601 01/07/23 0708 01/08/23 0528  WBC 18.7* 17.3* 17.9* 11.8*  NEUTROABS 16.6*  --   --   --  HGB 12.8* 12.3* 12.1* 11.6*  HCT 37.0* 36.9* 36.3* 34.6*  MCV 85.1 86.8 87.7 87.2  PLT 248 237 261 [id number]   Basic Metabolic Panel: Recent Labs  Lab 01/05/23 0912 01/05/23 0930 01/05/23 2227 01/06/23 0601 01/07/23 0708 01/08/23 0528  NA 139  --   --  141 139 141  K 3.3*  --  4.0 4.2 4.3 3.6  CL 108  --   --  112* 114* 109  CO2 20*  --   --  20* 21* 23  GLUCOSE 155*  --   --  132* 106* 89  BUN 25*  --   --  27* 28* 20  CREATININE 1.64*  --   --  1.10 0.95 0.93  CALCIUM 8.8*  --   --  8.7* 8.4* 8.5*  MG  --  1.5*  --   --   --   --    GFR: Estimated Creatinine Clearance: 71.3 mL/min (by  C-G formula based on SCr of 0.93 mg/dL). Liver Function Tests: Recent Labs  Lab 01/05/23 0912 01/06/23 0601  AST 25 18  ALT 13 11  ALKPHOS 162* 125  BILITOT 2.7* 1.0  PROT 7.3 6.4*  ALBUMIN 3.7 2.9*   No results for input(s): "LIPASE", "AMYLASE" in the last 168 hours. No results for input(s): "AMMONIA" in the last 168 hours. Coagulation Profile: Recent Labs  Lab 01/05/23 0912  INR 1.3*   Cardiac Enzymes: No results for input(s): "CKTOTAL", "CKMB", "CKMBINDEX", "TROPONINI" in the last 168 hours. BNP (last 3 results) No results for input(s): "PROBNP" in the last 8760 hours. HbA1C: No results for input(s): "HGBA1C" in the last 72 hours. CBG: No results for input(s): "GLUCAP" in the last 168 hours. Lipid Profile: No results for input(s): "CHOL", "HDL", "LDLCALC", "TRIG", "CHOLHDL", "LDLDIRECT" in the last 72 hours. Thyroid Function Tests: No results for input(s): "TSH", "T4TOTAL", "FREET4", "T3FREE", "THYROIDAB" in the last 72 hours. Anemia Panel: No results for input(s): "VITAMINB12", "FOLATE", "FERRITIN", "TIBC", "IRON", "RETICCTPCT" in the last 72 hours. Sepsis Labs: Recent Labs  Lab 01/05/23 0912 01/05/23 1125  LATICACIDVEN 1.8 1.1    Recent Results (from the past 240 hour(s))  Resp panel by RT-PCR (RSV, Flu A&B, Covid) Anterior Nasal Swab     Status: None   Collection Time: 01/05/23  9:10 AM   Specimen: Anterior Nasal Swab  Result Value Ref Range Status   SARS Coronavirus 2 by RT PCR NEGATIVE NEGATIVE Final    Comment: (NOTE) SARS-CoV-2 target nucleic acids are NOT DETECTED.  The SARS-CoV-2 RNA is generally detectable in upper respiratory specimens during the acute phase of infection. The lowest concentration of SARS-CoV-2 viral copies this assay can detect is 138 copies/mL. A negative result does not preclude SARS-Cov-2 infection and should not be used as the sole basis for treatment or other patient management decisions. A negative result may occur with   improper specimen collection/handling, submission of specimen other than nasopharyngeal swab, presence of viral mutation(s) within the areas targeted by this assay, and inadequate number of viral copies(<138 copies/mL). A negative result must be combined with clinical observations, patient history, and epidemiological information. The expected result is Negative.  Fact Sheet for Patients:  EntrepreneurPulse.com.au  Fact Sheet for Healthcare Providers:  IncredibleEmployment.be  This test is no t yet approved or cleared by the [place] FDA and  has been authorized for detection and/or diagnosis of SARS-CoV-2 by FDA under an Emergency Use Authorization (EUA). This EUA will remain  in effect (meaning this  test can be used) for the duration of the COVID-19 declaration under Section 564(b)(1) of the Act, 21 U.S.C.section 360bbb-3(b)(1), unless the authorization is terminated  or revoked sooner.       Influenza A by PCR NEGATIVE NEGATIVE Final   Influenza B by PCR NEGATIVE NEGATIVE Final    Comment: (NOTE) The Xpert Xpress SARS-CoV-2/FLU/RSV plus assay is intended as an aid in the diagnosis of influenza from Nasopharyngeal swab specimens and should not be used as a sole basis for treatment. Nasal washings and aspirates are unacceptable for Xpert Xpress SARS-CoV-2/FLU/RSV testing.  Fact Sheet for Patients: EntrepreneurPulse.com.au  Fact Sheet for Healthcare Providers: IncredibleEmployment.be  This test is not yet approved or cleared by the [place] FDA and has been authorized for detection and/or diagnosis of SARS-CoV-2 by FDA under an Emergency Use Authorization (EUA). This EUA will remain in effect (meaning this test can be used) for the duration of the COVID-19 declaration under Section 564(b)(1) of the Act, 21 U.S.C. section 360bbb-3(b)(1), unless the authorization is terminated or revoked.      Resp Syncytial Virus by PCR NEGATIVE NEGATIVE Final    Comment: (NOTE) Fact Sheet for Patients: EntrepreneurPulse.com.au  Fact Sheet for Healthcare Providers: IncredibleEmployment.be  This test is not yet approved or cleared by the [place] FDA and has been authorized for detection and/or diagnosis of SARS-CoV-2 by FDA under an Emergency Use Authorization (EUA). This EUA will remain in effect (meaning this test can be used) for the duration of the COVID-19 declaration under Section 564(b)(1) of the Act, 21 U.S.C. section 360bbb-3(b)(1), unless the authorization is terminated or revoked.  Performed at [facility], [address], [place], [place] [address]   Blood Culture (routine x 2)     Status: Abnormal (Preliminary result)   Collection Time: 01/05/23  9:30 AM   Specimen: BLOOD  Result Value Ref Range Status   Specimen Description   Final    BLOOD LEFT ANTECUBITAL Performed at [facility], [address] [place], [place] [address]    Special Requests   Final    BOTTLES DRAWN AEROBIC AND ANAEROBIC Blood Culture results may not be optimal due to an excessive volume of blood received in culture bottles Performed at [facility], [address] [place], [place] [address]    Culture  Setup Time   Final    GRAM POSITIVE COCCI AEROBIC BOTTLE ONLY CRITICAL VALUE NOTED.  VALUE IS CONSISTENT WITH PREVIOUSLY REPORTED AND CALLED VALUE. Performed at [facility], [address] [place], [place] [address]    Culture ENTEROCOCCUS FAECALIS (A)  Final   Report Status PENDING  Incomplete  Blood Culture (routine x 2)     Status: Abnormal (Preliminary result)   Collection Time: 01/05/23  9:30 AM   Specimen: BLOOD  Result Value Ref Range Status   Specimen Description   Final    BLOOD RIGHT ANTECUBITAL Performed at [facility], [address], [place], [place] [address]    Special Requests    Final    BOTTLES DRAWN AEROBIC AND ANAEROBIC Blood Culture results may not be optimal due to an excessive volume of blood received in culture bottles Performed at [facility], [address], [place], [place] [address]    Culture  Setup Time   Final    GRAM POSITIVE COCCI IN BOTH AEROBIC AND ANAEROBIC BOTTLES Organism ID to follow CRITICAL RESULT CALLED TO, READ BACK BY AND VERIFIED WITH: NATHAN BLUE@0529  01/06/23 RH Performed at  [address] [place], [place] [address]    Culture (A)  Final    ENTEROCOCCUS FAECALIS SUSCEPTIBILITIES TO FOLLOW Performed at [facility], [address], [place], [place] [address]    Report Status PENDING  Incomplete  Blood Culture ID Panel (Reflexed)     Status: Abnormal   Collection Time: 01/05/23  9:30 AM  Result Value Ref Range Status   Enterococcus faecalis DETECTED (A) NOT DETECTED Final    Comment: CRITICAL RESULT CALLED TO, READ BACK BY AND VERIFIED WITH: NATHAN BLUE@0529  01/06/23 RH    Enterococcus Faecium NOT DETECTED NOT DETECTED Final   Listeria monocytogenes NOT DETECTED NOT DETECTED Final   Staphylococcus species NOT DETECTED NOT DETECTED Final   Staphylococcus aureus (BCID) NOT DETECTED NOT DETECTED Final   Staphylococcus epidermidis NOT DETECTED NOT DETECTED Final   Staphylococcus lugdunensis NOT DETECTED NOT DETECTED Final   Streptococcus species NOT DETECTED NOT DETECTED Final   Streptococcus agalactiae NOT DETECTED NOT DETECTED Final   Streptococcus pneumoniae NOT DETECTED NOT DETECTED Final   Streptococcus pyogenes NOT DETECTED NOT DETECTED Final   A.calcoaceticus-baumannii NOT DETECTED NOT DETECTED Final   Bacteroides fragilis NOT DETECTED NOT DETECTED Final   Enterobacterales NOT DETECTED NOT DETECTED Final   Enterobacter cloacae complex NOT DETECTED NOT DETECTED Final   Escherichia coli NOT DETECTED NOT DETECTED Final   Klebsiella aerogenes NOT DETECTED NOT DETECTED Final    Klebsiella oxytoca NOT DETECTED NOT DETECTED Final   Klebsiella pneumoniae NOT DETECTED NOT DETECTED Final   Proteus species NOT DETECTED NOT DETECTED Final   Salmonella species NOT DETECTED NOT DETECTED Final   Serratia marcescens NOT DETECTED NOT DETECTED Final   Haemophilus influenzae NOT DETECTED NOT DETECTED Final   Neisseria meningitidis NOT DETECTED NOT DETECTED Final   Pseudomonas aeruginosa NOT DETECTED NOT DETECTED Final   Stenotrophomonas maltophilia NOT DETECTED NOT DETECTED Final   Candida albicans NOT DETECTED NOT DETECTED Final   Candida auris NOT DETECTED NOT DETECTED Final   Candida glabrata NOT DETECTED NOT DETECTED Final   Candida krusei NOT DETECTED NOT DETECTED Final   Candida parapsilosis NOT DETECTED NOT DETECTED Final   Candida tropicalis NOT DETECTED NOT DETECTED Final   Cryptococcus neoformans/gattii NOT DETECTED NOT DETECTED Final   Vancomycin resistance NOT DETECTED NOT DETECTED Final    Comment: Performed at [facility], [address] [place], [place] [address]  Urine Culture     Status: None   Collection Time: 01/05/23  1:59 PM   Specimen: Urine, Random  Result Value Ref Range Status   Specimen Description   Final    URINE, RANDOM Performed at [facility], [address], [place], [place] [address]    Special Requests LEFT URETER  Final   Culture   Final    NO GROWTH Performed at [facility], [address] [place] [address], [place], [place] [address]    Report Status 01/06/2023 FINAL  Final         Radiology Studies: No results found.      Scheduled Meds:  alfuzosin  10 mg Oral q AM   ARIPiprazole  15 mg Oral Daily   aspirin  81 mg Oral Daily   atorvastatin  20 mg Oral QHS   clonazePAM  1 mg Oral TID   enoxaparin (LOVENOX) injection  40 mg Subcutaneous Q24H   feeding supplement  237 mL Oral TID BM   liothyronine  25 mcg Oral Daily   lisinopril  40 mg Oral Daily   multivitamin with minerals  1 tablet Oral Daily    polyethylene glycol  17 g Oral QODAY   traZODone  50 mg Oral QHS   Continuous Infusions:  sodium chloride 40 mL/hr at 01/07/23 1821   ampicillin (OMNIPEN) IV 2 g (01/08/23 0345)     LOS: 3 days    Time spent: 30 mins    Wyvonnia Dusky, MD [facility] Pager [phone]  If 7PM-7AM, please contact night-coverage [email or web address] 01/08/2023, 8:07 AM

## 2023-01-08 NOTE — Care Management Important Message (Signed)
Important Message  Patient Details  Name: Johnny Rice MRN: [id number] Date of Birth: [birth year]   Medicare Important Message Given:  N/A - LOS <3 / Initial given by admissions     Dannette Barbara 01/08/2023, 9:08 AM

## 2023-01-09 DIAGNOSIS — R6520 Severe sepsis without septic shock: Secondary | ICD-10-CM | POA: Diagnosis not present

## 2023-01-09 DIAGNOSIS — A419 Sepsis, unspecified organism: Secondary | ICD-10-CM | POA: Diagnosis not present

## 2023-01-09 DIAGNOSIS — N139 Obstructive and reflux uropathy, unspecified: Secondary | ICD-10-CM | POA: Diagnosis not present

## 2023-01-09 LAB — CBC
HCT: 32.6 % — ABNORMAL LOW (ref 39.0–52.0)
Hemoglobin: 11.1 g/dL — ABNORMAL LOW (ref 13.0–17.0)
MCH: 29.2 pg (ref 26.0–34.0)
MCHC: 34 g/dL (ref 30.0–36.0)
MCV: 85.8 fL (ref 80.0–100.0)
Platelets: 260 10*3/uL (ref 150–400)
RBC: 3.8 MIL/uL — ABNORMAL LOW (ref 4.22–5.81)
RDW: 14.9 % (ref 11.5–15.5)
WBC: 14.6 10*3/uL — ABNORMAL HIGH (ref 4.0–10.5)
nRBC: 0 % (ref 0.0–0.2)

## 2023-01-09 LAB — BASIC METABOLIC PANEL
Anion gap: 14 (ref 5–15)
BUN: 20 mg/dL (ref 8–23)
CO2: 24 mmol/L (ref 22–32)
Calcium: 8.6 mg/dL — ABNORMAL LOW (ref 8.9–10.3)
Chloride: 104 mmol/L (ref 98–111)
Creatinine, Ser: 1.13 mg/dL (ref 0.61–1.24)
GFR, Estimated: >60 mL/min (ref >60)
Glucose, Bld: 108 mg/dL — ABNORMAL HIGH (ref 70–99)
Potassium: 3.6 mmol/L (ref 3.5–5.1)
Sodium: 142 mmol/L (ref 135–145)

## 2023-01-09 NOTE — Progress Notes (Signed)
PROGRESS NOTE    Johnny Rice  [id number] DOB: 01-11-1946 DOA: 01/05/2023 PCP: [facility]   Assessment & Plan:   Principal Problem:   Severe sepsis ([place]) Active Problems:   Complicated UTI (urinary tract infection)   Obstructive uropathy   Acute metabolic encephalopathy   AKI (acute kidney injury) ([place])   History of stroke   BPH (benign prostatic hyperplasia)   Essential hypertension   Hypothyroid   Tongue laceration   Kidney stone   Sinus bradycardia   Chest discomfort   Bacteremia due to Enterococcus  Assessment and Plan: Severe sepsis: present on admission. Met criteria w/ fever, tachycardia, leukocytosis, AKI, & bacteremia. Blood cxs growing enterococcus. Continue on IV ampicillin as per ID. Urine culture shows no growth    Bacteremia: blood cultures growing enterococcus. Likely secondary to urinary source even though urine cx is neg. ? if abxs were given prior to urine cx was collected. Repeat blood cxs NGTD. Continue on IV amp x 2 weeks as per ID. Fever yesterday afternoon.   Acute metabolic encephalopathy: mental status improved today. Possibly secondary to above infection    Obstructive uropathy: CT scan showing a 7 mm obstructing stone at the left UVJ with hydronephrosis and perinephric stranding. S/p left ureteral stent placed by Dr. Erlene Rice on 3/18. Continue on IV amp x  2 weeks as per ID  Dementia: continue w/ supportive care.    AKI: Cr is labile    Hx of CVA: continue on aspirin, statin    HTN: continue on ACE-I. Holding BB secondary to bradycardia   BPH: continue on alfuzosin    Hypothyroidism: continue on liothyronine      Chest discomfort: likely not cardiac. Troponin neg x 2.    Sinus bradycardia: continue to hold BB    Tongue laceration: continue w/ viscous lidocaine        DVT prophylaxis: lovenox  Code Status: full  Family Communication: discussed pt's care w/ pt's daughter, Johnny Rice, and answered her  questions Disposition Plan:  possibly d/c to SNF   Level of care: Med-Surg  Status is: Inpatient      Consultants:  ID Urology   Procedures:   Antimicrobials: IV ampicillin    Subjective: Pt c/o malaise  Objective: Vitals:   01/08/23 1928 01/09/23 0250 01/09/23 0500 01/09/23 0638  BP: 129/73 132/76  125/75  Pulse: 78 60  63  Resp: 16 18  18   Temp: 99.5 F (37.5 C) 99 F (37.2 C)  98.8 F (37.1 C)  TempSrc: Oral Oral  Oral  SpO2: 96% 97%  98%  Weight:   81.5 kg   Height:        Intake/Output Summary (Last 24 hours) at 01/09/2023 0840 Last data filed at 01/09/2023 0400 Gross per 24 hour  Intake 240 ml  Output 1050 ml  Net -810 ml   Filed Weights   01/05/23 1259 01/08/23 0500 01/09/23 0500  Weight: 76 kg 81.5 kg 81.5 kg    Examination:  General exam: Appears calm & comfortable   Respiratory system:  clear breath sounds b/l  Cardiovascular system: S1 & S2+. No rubs or clicks  Gastrointestinal system: Abd is soft, NT, ND & normal bowel sounds  Central nervous system: Moves all extremities  Psychiatry: judgement and insight appears improved. Flat mood and affect     Data Reviewed: I have personally reviewed following labs and imaging studies  CBC: Recent Labs  Lab 01/05/23 0912 01/06/23 0601 01/07/23 0708 01/08/23 [id number] 01/09/23 [id number]  WBC 18.7* 17.3* 17.9* 11.8* 14.6*  NEUTROABS 16.6*  --   --   --   --   HGB 12.8* 12.3* 12.1* 11.6* 11.1*  HCT 37.0* 36.9* 36.3* 34.6* 32.6*  MCV 85.1 86.8 87.7 87.2 85.8  PLT 248 237 261 264 [id number]   Basic Metabolic Panel: Recent Labs  Lab 01/05/23 0912 01/05/23 0930 01/05/23 2227 01/06/23 0601 01/07/23 0708 01/08/23 0528 01/09/23 0339  NA 139  --   --  141 139 141 142  K 3.3*  --  4.0 4.2 4.3 3.6 3.6  CL 108  --   --  112* 114* 109 104  CO2 20*  --   --  20* 21* 23 24  GLUCOSE 155*  --   --  132* 106* 89 108*  BUN 25*  --   --  27* 28* 20 20  CREATININE 1.64*  --   --  1.10 0.95 0.93 1.13  CALCIUM  8.8*  --   --  8.7* 8.4* 8.5* 8.6*  MG  --  1.5*  --   --   --   --   --    GFR: Estimated Creatinine Clearance: 58.7 mL/min (by C-G formula based on SCr of 1.13 mg/dL). Liver Function Tests: Recent Labs  Lab 01/05/23 0912 01/06/23 0601  AST 25 18  ALT 13 11  ALKPHOS 162* 125  BILITOT 2.7* 1.0  PROT 7.3 6.4*  ALBUMIN 3.7 2.9*   No results for input(s): "LIPASE", "AMYLASE" in the last 168 hours. No results for input(s): "AMMONIA" in the last 168 hours. Coagulation Profile: Recent Labs  Lab 01/05/23 0912  INR 1.3*   Cardiac Enzymes: No results for input(s): "CKTOTAL", "CKMB", "CKMBINDEX", "TROPONINI" in the last 168 hours. BNP (last 3 results) No results for input(s): "PROBNP" in the last 8760 hours. HbA1C: No results for input(s): "HGBA1C" in the last 72 hours. CBG: No results for input(s): "GLUCAP" in the last 168 hours. Lipid Profile: No results for input(s): "CHOL", "HDL", "LDLCALC", "TRIG", "CHOLHDL", "LDLDIRECT" in the last 72 hours. Thyroid Function Tests: No results for input(s): "TSH", "T4TOTAL", "FREET4", "T3FREE", "THYROIDAB" in the last 72 hours. Anemia Panel: No results for input(s): "VITAMINB12", "FOLATE", "FERRITIN", "TIBC", "IRON", "RETICCTPCT" in the last 72 hours. Sepsis Labs: Recent Labs  Lab 01/05/23 0912 01/05/23 1125  LATICACIDVEN 1.8 1.1    Recent Results (from the past 240 hour(s))  Resp panel by RT-PCR (RSV, Flu A&B, Covid) Anterior Nasal Swab     Status: None   Collection Time: 01/05/23  9:10 AM   Specimen: Anterior Nasal Swab  Result Value Ref Range Status   SARS Coronavirus 2 by RT PCR NEGATIVE NEGATIVE Final    Comment: (NOTE) SARS-CoV-2 target nucleic acids are NOT DETECTED.  The SARS-CoV-2 RNA is generally detectable in upper respiratory specimens during the acute phase of infection. The lowest concentration of SARS-CoV-2 viral copies this assay can detect is 138 copies/mL. A negative result does not preclude  SARS-Cov-2 infection and should not be used as the sole basis for treatment or other patient management decisions. A negative result may occur with  improper specimen collection/handling, submission of specimen other than nasopharyngeal swab, presence of viral mutation(s) within the areas targeted by this assay, and inadequate number of viral copies(<138 copies/mL). A negative result must be combined with clinical observations, patient history, and epidemiological information. The expected result is Negative.  Fact Sheet for Patients:  EntrepreneurPulse.com.au  Fact Sheet for Healthcare Providers:  IncredibleEmployment.be  This test is  no t yet approved or cleared by the [place] and  has been authorized for detection and/or diagnosis of SARS-CoV-2 by FDA under an Emergency Use Authorization (EUA). This EUA will remain  in effect (meaning this test can be used) for the duration of the COVID-19 declaration under Section 564(b)(1) of the Act, 21 U.S.C.section 360bbb-3(b)(1), unless the authorization is terminated  or revoked sooner.       Influenza A by PCR NEGATIVE NEGATIVE Final   Influenza B by PCR NEGATIVE NEGATIVE Final    Comment: (NOTE) The Xpert Xpress SARS-CoV-2/FLU/RSV plus assay is intended as an aid in the diagnosis of influenza from Nasopharyngeal swab specimens and should not be used as a sole basis for treatment. Nasal washings and aspirates are unacceptable for Xpert Xpress SARS-CoV-2/FLU/RSV testing.  Fact Sheet for Patients: EntrepreneurPulse.com.au  Fact Sheet for Healthcare Providers: IncredibleEmployment.be  This test is not yet approved or cleared by the [place] FDA and has been authorized for detection and/or diagnosis of SARS-CoV-2 by FDA under an Emergency Use Authorization (EUA). This EUA will remain in effect (meaning this test can be used) for the duration of  the COVID-19 declaration under Section 564(b)(1) of the Act, 21 U.S.C. section 360bbb-3(b)(1), unless the authorization is terminated or revoked.     Resp Syncytial Virus by PCR NEGATIVE NEGATIVE Final    Comment: (NOTE) Fact Sheet for Patients: EntrepreneurPulse.com.au  Fact Sheet for Healthcare Providers: IncredibleEmployment.be  This test is not yet approved or cleared by the [place] FDA and has been authorized for detection and/or diagnosis of SARS-CoV-2 by FDA under an Emergency Use Authorization (EUA). This EUA will remain in effect (meaning this test can be used) for the duration of the COVID-19 declaration under Section 564(b)(1) of the Act, 21 U.S.C. section 360bbb-3(b)(1), unless the authorization is terminated or revoked.  Performed at [facility], [address], [place], [place] [address]   Blood Culture (routine x 2)     Status: Abnormal   Collection Time: 01/05/23  9:30 AM   Specimen: BLOOD  Result Value Ref Range Status   Specimen Description   Final    BLOOD LEFT ANTECUBITAL Performed at [facility], [address] [place], [place] [address]    Special Requests   Final    BOTTLES DRAWN AEROBIC AND ANAEROBIC Blood Culture results may not be optimal due to an excessive volume of blood received in culture bottles Performed at [facility], [address] [place], [place] [address]    Culture  Setup Time   Final    GRAM POSITIVE COCCI AEROBIC BOTTLE ONLY CRITICAL VALUE NOTED.  VALUE IS CONSISTENT WITH PREVIOUSLY REPORTED AND CALLED VALUE. Performed at [facility], [address], [place], [place] [address]    Culture (A)  Final    ENTEROCOCCUS FAECALIS SUSCEPTIBILITIES PERFORMED ON PREVIOUS CULTURE WITHIN THE LAST 5 DAYS. Performed at [facility], [place] [address] [place], [place] [address]    Report Status 01/08/2023 FINAL  Final  Blood Culture (routine x 2)      Status: Abnormal   Collection Time: 01/05/23  9:30 AM   Specimen: BLOOD  Result Value Ref Range Status   Specimen Description   Final    BLOOD RIGHT ANTECUBITAL Performed at [facility], [address] [place],  [address]    Special Requests   Final    BOTTLES DRAWN AEROBIC AND ANAEROBIC Blood Culture results may not be optimal due to  an excessive volume of blood received in culture bottles Performed at [facility], [address] [place], [place] [address]    Culture  Setup Time   Final    GRAM POSITIVE COCCI IN BOTH AEROBIC AND ANAEROBIC BOTTLES Organism ID to follow CRITICAL RESULT CALLED TO, READ BACK BY AND VERIFIED WITH: NATHAN BLUE@[email or web address]  01/06/23 RH Performed at [facility], [address] [place], [place] [address]    Culture ENTEROCOCCUS FAECALIS (A)  Final   Report Status 01/08/2023 FINAL  Final   Organism ID, Bacteria ENTEROCOCCUS FAECALIS  Final      Susceptibility   Enterococcus faecalis - MIC*    AMPICILLIN <=2 SENSITIVE Sensitive     VANCOMYCIN 1 SENSITIVE Sensitive     GENTAMICIN SYNERGY SENSITIVE Sensitive     * ENTEROCOCCUS FAECALIS  Blood Culture ID Panel (Reflexed)     Status: Abnormal   Collection Time: 01/05/23  9:30 AM  Result Value Ref Range Status   Enterococcus faecalis DETECTED (A) NOT DETECTED Final    Comment: CRITICAL RESULT CALLED TO, READ BACK BY AND VERIFIED WITH: NATHAN BLUE@0529  01/06/23 RH    Enterococcus Faecium NOT DETECTED NOT DETECTED Final   Listeria monocytogenes NOT DETECTED NOT DETECTED Final   Staphylococcus species NOT DETECTED NOT DETECTED Final   Staphylococcus aureus (BCID) NOT DETECTED NOT DETECTED Final   Staphylococcus epidermidis NOT DETECTED NOT DETECTED Final   Staphylococcus lugdunensis NOT DETECTED NOT DETECTED Final   Streptococcus species NOT DETECTED NOT DETECTED Final   Streptococcus agalactiae NOT DETECTED NOT DETECTED Final   Streptococcus pneumoniae NOT DETECTED NOT  DETECTED Final   Streptococcus pyogenes NOT DETECTED NOT DETECTED Final   A.calcoaceticus-baumannii NOT DETECTED NOT DETECTED Final   Bacteroides fragilis NOT DETECTED NOT DETECTED Final   Enterobacterales NOT DETECTED NOT DETECTED Final   Enterobacter cloacae complex NOT DETECTED NOT DETECTED Final   Escherichia coli NOT DETECTED NOT DETECTED Final   Klebsiella aerogenes NOT DETECTED NOT DETECTED Final   Klebsiella oxytoca NOT DETECTED NOT DETECTED Final   Klebsiella pneumoniae NOT DETECTED NOT DETECTED Final   Proteus species NOT DETECTED NOT DETECTED Final   Salmonella species NOT DETECTED NOT DETECTED Final   Serratia marcescens NOT DETECTED NOT DETECTED Final   Haemophilus influenzae NOT DETECTED NOT DETECTED Final   Neisseria meningitidis NOT DETECTED NOT DETECTED Final   Pseudomonas aeruginosa NOT DETECTED NOT DETECTED Final   Stenotrophomonas maltophilia NOT DETECTED NOT DETECTED Final   Candida albicans NOT DETECTED NOT DETECTED Final   Candida auris NOT DETECTED NOT DETECTED Final   Candida glabrata NOT DETECTED NOT DETECTED Final   Candida krusei NOT DETECTED NOT DETECTED Final   Candida parapsilosis NOT DETECTED NOT DETECTED Final   Candida tropicalis NOT DETECTED NOT DETECTED Final   Cryptococcus neoformans/gattii NOT DETECTED NOT DETECTED Final   Vancomycin resistance NOT DETECTED NOT DETECTED Final    Comment: Performed at [facility], [address], [place], [place] [address]  Urine Culture     Status: None   Collection Time: 01/05/23  1:59 PM   Specimen: Urine, Random  Result Value Ref Range Status   Specimen Description   Final    URINE, RANDOM Performed at [facility], [address] [place], [place] [address]    Special Requests LEFT URETER  Final   Culture   Final    NO GROWTH Performed at [facility], [address], [place], [place] [address]    Report Status 01/06/2023  FINAL  Final  Culture, blood (Routine X 2) w Reflex to  ID Panel     Status: None (Preliminary result)   Collection Time: 01/07/23  2:45 PM   Specimen: BLOOD  Result Value Ref Range Status   Specimen Description BLOOD BLOOD RIGHT HAND  Final   Special Requests   Final    BOTTLES DRAWN AEROBIC ONLY Blood Culture results may not be optimal due to an inadequate volume of blood received in culture bottles   Culture   Final    NO GROWTH 2 DAYS Performed at [facility], [address], [place], [place] [address]    Report Status PENDING  Incomplete  Culture, blood (Routine X 2) w Reflex to ID Panel     Status: None (Preliminary result)   Collection Time: 01/07/23  2:46 PM   Specimen: BLOOD LEFT HAND  Result Value Ref Range Status   Specimen Description BLOOD LEFT HAND BLOOD LEFT HAND  Final   Special Requests   Final    BOTTLES DRAWN AEROBIC ONLY Blood Culture results may not be optimal due to an inadequate volume of blood received in culture bottles   Culture   Final    NO GROWTH 2 DAYS Performed at [facility], [address] [place], [place] [address]    Report Status PENDING  Incomplete         Radiology Studies: No results found.      Scheduled Meds:  alfuzosin  10 mg Oral q AM   ARIPiprazole  15 mg Oral Daily   aspirin  81 mg Oral Daily   atorvastatin  20 mg Oral QHS   clonazePAM  1 mg Oral TID   enoxaparin (LOVENOX) injection  40 mg Subcutaneous Q24H   feeding supplement  237 mL Oral TID BM   liothyronine  25 mcg Oral Daily   lisinopril  40 mg Oral Daily   multivitamin with minerals  1 tablet Oral Daily   polyethylene glycol  17 g Oral QODAY   traZODone  50 mg Oral QHS   Continuous Infusions:  ampicillin (OMNIPEN) IV 2 g (01/09/23 0451)     LOS: 4 days    Time spent: 25 mins    Wyvonnia Dusky, MD [facility] Pager [phone]  If 7PM-7AM, please contact night-coverage [email or web address] 01/09/2023, 8:40 AM

## 2023-01-09 NOTE — Progress Notes (Addendum)
Date of Admission:  01/05/2023      ID: Bassam Sacco is a 77 y.o. male  Principal Problem:   Severe sepsis ([place]) Active Problems:   BPH (benign prostatic hyperplasia)   History of stroke   Essential hypertension   Complicated UTI (urinary tract infection)   Hypothyroid   Obstructive uropathy   AKI (acute kidney injury) ([place])   Acute metabolic encephalopathy   Tongue laceration   Kidney stone   Sinus bradycardia   Chest discomfort   Bacteremia due to Enterococcus   Pt had a fever last evening Subjective: No specific complaints now-   Medications:   alfuzosin  10 mg Oral q AM   ARIPiprazole  15 mg Oral Daily   aspirin  81 mg Oral Daily   atorvastatin  20 mg Oral QHS   clonazePAM  1 mg Oral TID   enoxaparin (LOVENOX) injection  40 mg Subcutaneous Q24H   feeding supplement  237 mL Oral TID BM   liothyronine  25 mcg Oral Daily   lisinopril  40 mg Oral Daily   multivitamin with minerals  1 tablet Oral Daily   polyethylene glycol  17 g Oral QODAY   traZODone  50 mg Oral QHS    Objective: Vital signs in last 24 hours: Patient Vitals for the past 24 hrs:  BP Temp Temp src Pulse Resp SpO2 Weight  01/09/23 0902 (!) 125/106 98.2 F (36.8 C) -- 65 20 98 % --  01/09/23 0638 125/75 98.8 F (37.1 C) Oral 63 18 98 % --  01/09/23 0500 -- -- -- -- -- -- 81.5 kg  01/09/23 0250 132/76 99 F (37.2 C) Oral 60 18 97 % --  01/08/23 1928 129/73 99.5 F (37.5 C) Oral 78 16 96 % --  01/08/23 1736 127/71 99.5 F (37.5 C) -- 83 17 95 % --  01/08/23 1636 (!) 164/75 (!) 102.6 F (39.2 C) -- 71 18 95 % --       PHYSICAL EXAM: resting no distress Chest b/l air entry [id number] Abd soft Lymph: Cervical, supraclavicular normal. Neurologic: left facial palsy Left eye bkindness  Lab Results    Latest Ref Rng & Units 01/09/2023    3:39 AM 01/08/2023    5:28 AM 01/07/2023    7:08 AM  CBC  WBC 4.0 - 10.5 K/uL 14.6  11.8  17.9   Hemoglobin 13.0 - 17.0 g/dL 11.1  11.6  12.1    Hematocrit 39.0 - 52.0 % 32.6  34.6  36.3   Platelets 150 - 400 K/uL 260  264  261        Latest Ref Rng & Units 01/09/2023    3:39 AM 01/08/2023    5:28 AM 01/07/2023    7:08 AM  CMP  Glucose 70 - 99 mg/dL 108  89  106   BUN 8 - 23 mg/dL 20  20  28    Creatinine 0.61 - 1.24 mg/dL 1.13  0.93  0.95   Sodium 135 - 145 mmol/L 142  141  139   Potassium 3.5 - 5.1 mmol/L 3.6  3.6  4.3   Chloride 98 - 111 mmol/L 104  109  114   CO2 22 - 32 mmol/L 24  23  21    Calcium 8.9 - 10.3 mg/dL 8.6  8.5  8.4       Microbiology: 01/05/23 BC Enterococcus bacteremia 01/07/23 BCNG   Assessment/Plan: Sepsis secondary to complicated UTI from obstructive uropathy due to a stone on  the left side causing hydronephrosis.  Status post stent placement.   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

## 2023-01-10 ENCOUNTER — Inpatient Hospital Stay (HOSPITAL_COMMUNITY)
Admit: 2023-01-10 | Discharge: 2023-01-10 | Disposition: A | Discharge status: Home / Self Care | Payer: Medicare Other | Attending: Infectious Diseases | Admitting: Infectious Diseases

## 2023-01-10 DIAGNOSIS — R7881 Bacteremia: Secondary | ICD-10-CM

## 2023-01-10 DIAGNOSIS — A419 Sepsis, unspecified organism: Secondary | ICD-10-CM | POA: Diagnosis not present

## 2023-01-10 DIAGNOSIS — R6520 Severe sepsis without septic shock: Secondary | ICD-10-CM | POA: Diagnosis not present

## 2023-01-10 DIAGNOSIS — N139 Obstructive and reflux uropathy, unspecified: Secondary | ICD-10-CM | POA: Diagnosis not present

## 2023-01-10 LAB — CBC
HCT: 33.1 % — ABNORMAL LOW (ref 39.0–52.0)
Hemoglobin: 11.1 g/dL — ABNORMAL LOW (ref 13.0–17.0)
MCH: 29.2 pg (ref 26.0–34.0)
MCHC: 33.5 g/dL (ref 30.0–36.0)
MCV: 87.1 fL (ref 80.0–100.0)
Platelets: 282 10*3/uL (ref 150–400)
RBC: 3.8 MIL/uL — ABNORMAL LOW (ref 4.22–5.81)
RDW: 15 % (ref 11.5–15.5)
WBC: 13 10*3/uL — ABNORMAL HIGH (ref 4.0–10.5)
nRBC: 0 % (ref 0.0–0.2)

## 2023-01-10 LAB — ECHOCARDIOGRAM COMPLETE
AR max vel: 1.9 cm2
AV Area VTI: 1.57 cm2
AV Area mean vel: 1.77 cm2
AV Mean grad: 3.5 mmHg
AV Peak grad: 6.4 mmHg
Ao pk vel: 1.27 m/s
Area-P 1/2: 3.68 cm2
Calc EF: 71 %
Height: 67 in
P 1/2 time: 1183 msec
S' Lateral: 2.4 cm
Single Plane A2C EF: 66 %
Single Plane A4C EF: 74.7 %
Weight: 2907.2 oz

## 2023-01-10 LAB — BASIC METABOLIC PANEL
Anion gap: 11 (ref 5–15)
BUN: 21 mg/dL (ref 8–23)
CO2: 24 mmol/L (ref 22–32)
Calcium: 8.5 mg/dL — ABNORMAL LOW (ref 8.9–10.3)
Chloride: 105 mmol/L (ref 98–111)
Creatinine, Ser: 1.03 mg/dL (ref 0.61–1.24)
GFR, Estimated: >60 mL/min (ref >60)
Glucose, Bld: 110 mg/dL — ABNORMAL HIGH (ref 70–99)
Potassium: 3.5 mmol/L (ref 3.5–5.1)
Sodium: 140 mmol/L (ref 135–145)

## 2023-01-10 NOTE — Progress Notes (Signed)
PROGRESS NOTE    Johnny Rice  [id number] DOB: Dec 24, 1945 DOA: 01/05/2023 PCP: [facility]   Assessment & Plan:   Principal Problem:   Severe sepsis ([place]) Active Problems:   Complicated UTI (urinary tract infection)   Obstructive uropathy   Acute metabolic encephalopathy   AKI (acute kidney injury) ([place])   History of stroke   BPH (benign prostatic hyperplasia)   Essential hypertension   Hypothyroid   Tongue laceration   Kidney stone   Sinus bradycardia   Chest discomfort   Bacteremia due to Enterococcus  Assessment and Plan: Severe sepsis: present on admission. Met criteria w/ fever, tachycardia, leukocytosis, AKI, & bacteremia. Blood cxs growing enterococcus. Continue on IV ampicillin as per ID. Urine culture shows no growth    Bacteremia: blood cultures growing enterococcus. Likely secondary to urinary source even though urine cx is neg. ? if abxs were given prior to urine cx was collected. Repeat blood cxs NGTD. Afebrile x 24 hrs. Continue on IV ampicillin x 2 weeks  Acute metabolic encephalopathy: mental status is labile. Possibly secondary to above infection    Obstructive uropathy: CT scan showing a 7 mm obstructing stone at the left UVJ with hydronephrosis and perinephric stranding. S/p left ureteral stent placed by Dr. Erlene Quan on 3/18. Continue on IV ampicillin x 2 weeks   Dementia: continue w/ supportive care    AKI: resolved    Hx of CVA: continue on aspirin, statin    HTN: continue on lisinopril. Holding metoprolol secondary to bradycardia  BPH: continue on uroxatral     Hypothyroidism: continue on liothyronine   Chest discomfort: likely not cardiac. Troponin neg x 2.    Sinus bradycardia: continue to hold metoprolol   Tongue laceration: continue w/ viscous lidocaine         DVT prophylaxis: lovenox  Code Status: full  Family Communication: called pt's daughter, Myra, no answer so I left a voicemail  Disposition Plan:   possibly d/c to SNF   Level of care: Med-Surg  Status is: Inpatient    Consultants:  ID Urology   Procedures:   Antimicrobials: IV ampicillin    Subjective: Pt c/o fatigue   Objective: Vitals:   01/09/23 0902 01/09/23 1639 01/09/23 1916 01/10/23 0409  BP: (!) 125/106 125/82 (!) 144/86 (!) 151/64  Pulse: 65 72 98 81  Resp: 20 20 20 20   Temp: 98.2 F (36.8 C) 98.4 F (36.9 C) 97.8 F (36.6 C) 98 F (36.7 C)  TempSrc:   Oral Oral  SpO2: 98% 98% 99% 99%  Weight:    82.4 kg  Height:        Intake/Output Summary (Last 24 hours) at 01/10/2023 0802 Last data filed at 01/10/2023 0439 Gross per 24 hour  Intake 360 ml  Output 1100 ml  Net -740 ml   Filed Weights   01/08/23 0500 01/09/23 0500 01/10/23 0409  Weight: 81.5 kg 81.5 kg 82.4 kg    Examination:  General exam: Appears comfortable   Respiratory system: clear breath sounds b/l  Cardiovascular system: S1/S2+. No rubs or clicks  Gastrointestinal system: Abd is soft, NT, ND & normal bowel sounds Central nervous system: Moves all extremities  Psychiatry: judgement and insight appears at baseline. Flat mood and affect    Data Reviewed: I have personally reviewed following labs and imaging studies  CBC: Recent Labs  Lab 01/05/23 0912 01/06/23 0601 01/07/23 0708 01/08/23 0528 01/09/23 0339 01/10/23 0516  WBC 18.7* 17.3* 17.9* 11.8* 14.6* 13.0*  NEUTROABS  16.6*  --   --   --   --   --   HGB 12.8* 12.3* 12.1* 11.6* 11.1* 11.1*  HCT 37.0* 36.9* 36.3* 34.6* 32.6* 33.1*  MCV 85.1 86.8 87.7 87.2 85.8 87.1  PLT 248 237 261 264 260 [id number]   Basic Metabolic Panel: Recent Labs  Lab 01/05/23 0930 01/05/23 2227 01/06/23 0601 01/07/23 0708 01/08/23 0528 01/09/23 0339 01/10/23 0516  NA  --   --  141 139 141 142 140  K  --    < > 4.2 4.3 3.6 3.6 3.5  CL  --   --  112* 114* 109 104 105  CO2  --   --  20* 21* 23 24 24   GLUCOSE  --   --  132* 106* 89 108* 110*  BUN  --   --  27* 28* 20 20 21   CREATININE  --    --  1.10 0.95 0.93 1.13 1.03  CALCIUM  --   --  8.7* 8.4* 8.5* 8.6* 8.5*  MG 1.5*  --   --   --   --   --   --    < > = values in this interval not displayed.   GFR: Estimated Creatinine Clearance: 64.6 mL/min (by C-G formula based on SCr of 1.03 mg/dL). Liver Function Tests: Recent Labs  Lab 01/05/23 0912 01/06/23 0601  AST 25 18  ALT 13 11  ALKPHOS 162* 125  BILITOT 2.7* 1.0  PROT 7.3 6.4*  ALBUMIN 3.7 2.9*   No results for input(s): "LIPASE", "AMYLASE" in the last 168 hours. No results for input(s): "AMMONIA" in the last 168 hours. Coagulation Profile: Recent Labs  Lab 01/05/23 0912  INR 1.3*   Cardiac Enzymes: No results for input(s): "CKTOTAL", "CKMB", "CKMBINDEX", "TROPONINI" in the last 168 hours. BNP (last 3 results) No results for input(s): "PROBNP" in the last 8760 hours. HbA1C: No results for input(s): "HGBA1C" in the last 72 hours. CBG: No results for input(s): "GLUCAP" in the last 168 hours. Lipid Profile: No results for input(s): "CHOL", "HDL", "LDLCALC", "TRIG", "CHOLHDL", "LDLDIRECT" in the last 72 hours. Thyroid Function Tests: No results for input(s): "TSH", "T4TOTAL", "FREET4", "T3FREE", "THYROIDAB" in the last 72 hours. Anemia Panel: No results for input(s): "VITAMINB12", "FOLATE", "FERRITIN", "TIBC", "IRON", "RETICCTPCT" in the last 72 hours. Sepsis Labs: Recent Labs  Lab 01/05/23 0912 01/05/23 1125  LATICACIDVEN 1.8 1.1    Recent Results (from the past 240 hour(s))  Resp panel by RT-PCR (RSV, Flu A&B, Covid) Anterior Nasal Swab     Status: None   Collection Time: 01/05/23  9:10 AM   Specimen: Anterior Nasal Swab  Result Value Ref Range Status   SARS Coronavirus 2 by RT PCR NEGATIVE NEGATIVE Final    Comment: (NOTE) SARS-CoV-2 target nucleic acids are NOT DETECTED.  The SARS-CoV-2 RNA is generally detectable in upper respiratory specimens during the acute phase of infection. The lowest concentration of SARS-CoV-2 viral copies this  assay can detect is 138 copies/mL. A negative result does not preclude SARS-Cov-2 infection and should not be used as the sole basis for treatment or other patient management decisions. A negative result may occur with  improper specimen collection/handling, submission of specimen other than nasopharyngeal swab, presence of viral mutation(s) within the areas targeted by this assay, and inadequate number of viral copies(<138 copies/mL). A negative result must be combined with clinical observations, patient history, and epidemiological information. The expected result is Negative.  Fact Sheet for Patients:  EntrepreneurPulse.com.au  Fact Sheet for Healthcare Providers:  IncredibleEmployment.be  This test is no t yet approved or cleared by the [place] FDA and  has been authorized for detection and/or diagnosis of SARS-CoV-2 by FDA under an Emergency Use Authorization (EUA). This EUA will remain  in effect (meaning this test can be used) for the duration of the COVID-19 declaration under Section 564(b)(1) of the Act, 21 U.S.C.section 360bbb-3(b)(1), unless the authorization is terminated  or revoked sooner.       Influenza A by PCR NEGATIVE NEGATIVE Final   Influenza B by PCR NEGATIVE NEGATIVE Final    Comment: (NOTE) The Xpert Xpress SARS-CoV-2/FLU/RSV plus assay is intended as an aid in the diagnosis of influenza from Nasopharyngeal swab specimens and should not be used as a sole basis for treatment. Nasal washings and aspirates are unacceptable for Xpert Xpress SARS-CoV-2/FLU/RSV testing.  Fact Sheet for Patients: EntrepreneurPulse.com.au  Fact Sheet for Healthcare Providers: IncredibleEmployment.be  This test is not yet approved or cleared by the [place] FDA and has been authorized for detection and/or diagnosis of SARS-CoV-2 by FDA under an Emergency Use Authorization (EUA). This EUA will  remain in effect (meaning this test can be used) for the duration of the COVID-19 declaration under Section 564(b)(1) of the Act, 21 U.S.C. section 360bbb-3(b)(1), unless the authorization is terminated or revoked.     Resp Syncytial Virus by PCR NEGATIVE NEGATIVE Final    Comment: (NOTE) Fact Sheet for Patients: EntrepreneurPulse.com.au  Fact Sheet for Healthcare Providers: IncredibleEmployment.be  This test is not yet approved or cleared by the [place] FDA and has been authorized for detection and/or diagnosis of SARS-CoV-2 by FDA under an Emergency Use Authorization (EUA). This EUA will remain in effect (meaning this test can be used) for the duration of the COVID-19 declaration under Section 564(b)(1) of the Act, 21 U.S.C. section 360bbb-3(b)(1), unless the authorization is terminated or revoked.  Performed at [facility], [address] [place], [place] [address]   Blood Culture (routine x 2)     Status: Abnormal   Collection Time: 01/05/23  9:30 AM   Specimen: BLOOD  Result Value Ref Range Status   Specimen Description   Final    BLOOD LEFT ANTECUBITAL Performed at [facility], [address] [place], [place] [address]    Special Requests   Final    BOTTLES DRAWN AEROBIC AND ANAEROBIC Blood Culture results may not be optimal due to an excessive volume of blood received in culture bottles Performed at [facility], [address], [place], [place] [address]    Culture  Setup Time   Final    GRAM POSITIVE COCCI AEROBIC BOTTLE ONLY CRITICAL VALUE NOTED.  VALUE IS CONSISTENT WITH PREVIOUSLY REPORTED AND CALLED VALUE. Performed at [facility], [address], [place], [place] [address]    Culture (A)  Final    ENTEROCOCCUS FAECALIS SUSCEPTIBILITIES PERFORMED ON PREVIOUS CULTURE WITHIN THE LAST 5 DAYS. Performed at [facility], [place] [address], [place], [place] [address]     Report Status 01/08/2023 FINAL  Final  Blood Culture (routine x 2)     Status: Abnormal   Collection Time: 01/05/23  9:30 AM   Specimen: BLOOD  Result Value Ref Range Status   Specimen Description   Final    BLOOD RIGHT ANTECUBITAL Performed at [facility], [address] [place], [place] [address]    Special Requests   Final    BOTTLES  DRAWN AEROBIC AND ANAEROBIC Blood Culture results may not be optimal due to an excessive volume of blood received in culture bottles Performed at [facility], [address] [place], [place] [address]    Culture  Setup Time   Final    GRAM POSITIVE COCCI IN BOTH AEROBIC AND ANAEROBIC BOTTLES Organism ID to follow CRITICAL RESULT CALLED TO, READ BACK BY AND VERIFIED WITH: NATHAN BLUE@0529  01/06/23 RH Performed at [facility], [address] [place], [place] [address]    Culture ENTEROCOCCUS FAECALIS (A)  Final   Report Status 01/08/2023 FINAL  Final   Organism ID, Bacteria ENTEROCOCCUS FAECALIS  Final      Susceptibility   Enterococcus faecalis - MIC*    AMPICILLIN <=2 SENSITIVE Sensitive     VANCOMYCIN 1 SENSITIVE Sensitive     GENTAMICIN SYNERGY SENSITIVE Sensitive     * ENTEROCOCCUS FAECALIS  Blood Culture ID Panel (Reflexed)     Status: Abnormal   Collection Time: 01/05/23  9:30 AM  Result Value Ref Range Status   Enterococcus faecalis DETECTED (A) NOT DETECTED Final    Comment: CRITICAL RESULT CALLED TO, READ BACK BY AND VERIFIED WITH: NATHAN BLUE@0529  01/06/23 RH    Enterococcus Faecium NOT DETECTED NOT DETECTED Final   Listeria monocytogenes NOT DETECTED NOT DETECTED Final   Staphylococcus species NOT DETECTED NOT DETECTED Final   Staphylococcus aureus (BCID) NOT DETECTED NOT DETECTED Final   Staphylococcus epidermidis NOT DETECTED NOT DETECTED Final   Staphylococcus lugdunensis NOT DETECTED NOT DETECTED Final   Streptococcus species NOT DETECTED NOT DETECTED Final   Streptococcus agalactiae NOT  DETECTED NOT DETECTED Final   Streptococcus pneumoniae NOT DETECTED NOT DETECTED Final   Streptococcus pyogenes NOT DETECTED NOT DETECTED Final   A.calcoaceticus-baumannii NOT DETECTED NOT DETECTED Final   Bacteroides fragilis NOT DETECTED NOT DETECTED Final   Enterobacterales NOT DETECTED NOT DETECTED Final   Enterobacter cloacae complex NOT DETECTED NOT DETECTED Final   Escherichia coli NOT DETECTED NOT DETECTED Final   Klebsiella aerogenes NOT DETECTED NOT DETECTED Final   Klebsiella oxytoca NOT DETECTED NOT DETECTED Final   Klebsiella pneumoniae NOT DETECTED NOT DETECTED Final   Proteus species NOT DETECTED NOT DETECTED Final   Salmonella species NOT DETECTED NOT DETECTED Final   Serratia marcescens NOT DETECTED NOT DETECTED Final   Haemophilus influenzae NOT DETECTED NOT DETECTED Final   Neisseria meningitidis NOT DETECTED NOT DETECTED Final   Pseudomonas aeruginosa NOT DETECTED NOT DETECTED Final   Stenotrophomonas maltophilia NOT DETECTED NOT DETECTED Final   Candida albicans NOT DETECTED NOT DETECTED Final   Candida auris NOT DETECTED NOT DETECTED Final   Candida glabrata NOT DETECTED NOT DETECTED Final   Candida krusei NOT DETECTED NOT DETECTED Final   Candida parapsilosis NOT DETECTED NOT DETECTED Final   Candida tropicalis NOT DETECTED NOT DETECTED Final   Cryptococcus neoformans/gattii NOT DETECTED NOT DETECTED Final   Vancomycin resistance NOT DETECTED NOT DETECTED Final    Comment: Performed at [facility], [address], [place], [place] [address]  Urine Culture     Status: None   Collection Time: 01/05/23  1:59 PM   Specimen: Urine, Random  Result Value Ref Range Status   Specimen Description   Final    URINE, RANDOM Performed at [facility], [address] [place], [place] [address]    Special Requests LEFT URETER  Final   Culture   Final    NO GROWTH Performed at [facility],  [address] [place], [place] [address]    Report  Status 01/06/2023 FINAL  Final  Culture, blood (Routine X 2) w Reflex to ID Panel     Status: None (Preliminary result)   Collection Time: 01/07/23  2:45 PM   Specimen: BLOOD  Result Value Ref Range Status   Specimen Description BLOOD BLOOD RIGHT HAND  Final   Special Requests   Final    BOTTLES DRAWN AEROBIC ONLY Blood Culture results may not be optimal due to an inadequate volume of blood received in culture bottles   Culture   Final    NO GROWTH 3 DAYS Performed at [facility], [address] [place], [place] [address]    Report Status PENDING  Incomplete  Culture, blood (Routine X 2) w Reflex to ID Panel     Status: None (Preliminary result)   Collection Time: 01/07/23  2:46 PM   Specimen: BLOOD LEFT HAND  Result Value Ref Range Status   Specimen Description BLOOD LEFT HAND BLOOD LEFT HAND  Final   Special Requests   Final    BOTTLES DRAWN AEROBIC ONLY Blood Culture results may not be optimal due to an inadequate volume of blood received in culture bottles   Culture   Final    NO GROWTH 3 DAYS Performed at [facility], [address], [place], [place] [address]    Report Status PENDING  Incomplete         Radiology Studies: No results found.      Scheduled Meds:  alfuzosin  10 mg Oral q AM   ARIPiprazole  15 mg Oral Daily   aspirin  81 mg Oral Daily   atorvastatin  20 mg Oral QHS   clonazePAM  1 mg Oral TID   enoxaparin (LOVENOX) injection  40 mg Subcutaneous Q24H   feeding supplement  237 mL Oral TID BM   liothyronine  25 mcg Oral Daily   lisinopril  40 mg Oral Daily   multivitamin with minerals  1 tablet Oral Daily   polyethylene glycol  17 g Oral QODAY   traZODone  50 mg Oral QHS   Continuous Infusions:  ampicillin (OMNIPEN) IV 2 g (01/10/23 0408)     LOS: 5 days    Time spent: 25 mins    Wyvonnia Dusky, MD [facility] Pager [phone]  If 7PM-7AM, please contact night-coverage [email or web address] 01/10/2023,  8:02 AM

## 2023-01-10 NOTE — Progress Notes (Signed)
Physical Therapy Treatment Patient Details Name: Johnny Rice MRN: [id number] DOB: 1946/02/19 Today's Date: 01/10/2023   History of Present Illness presented to ER secondary to fever, nausea/vomiting; admitted for sepsis, encephalopathy related to obstructive uropathy.    PT Comments    Skilled session worked on bed mobility (Mod A), transfers (sit<>stand with RW Mod A +2), and activity tolerance in sitting EOB.  Pt progressed with sitting EOB from 9min to 7 min. Pt reported dizziness BP 141/116 (in sitting and standing) but then reported dizziness subsiding.  Pt continues to be limited demonstrating overall weakness and fatigues quickly.  Current PT D/C plan remains appropriate.  Recommendations for follow up therapy are one component of a multi-disciplinary discharge planning process, led by the attending physician.  Recommendations may be updated based on patient status, additional functional criteria and insurance authorization.  Follow Up Recommendations  Skilled nursing-short term rehab (<3 hours/day) Can patient physically be transported by private vehicle: No   Assistance Recommended at Discharge Frequent or constant Supervision/Assistance  Patient can return home with the following Two people to help with walking and/or transfers;Two people to help with bathing/dressing/bathroom   Equipment Recommendations       Recommendations for Other Services       Precautions / Restrictions Precautions Precautions: Fall Restrictions Weight Bearing Restrictions: No     Mobility  Bed Mobility Overal bed mobility: Needs Assistance Bed Mobility: Supine to Sit, Sit to Supine     Supine to sit: Min assist Sit to supine: Mod assist   General bed mobility comments: Close supervision for static sitting balance. Tolerated sitting EOB for 33min min reported dizziness checked BP 141/116 in sitting then in standing with RW.    Transfers Overall transfer level: Needs assistance Equipment  used: Rolling walker (2 wheels) Transfers: Sit to/from Stand Sit to Stand: Mod assist, +2 physical assistance (Pt leans to the R.)           General transfer comment: In standing BP 141/116, cues for posture; pt fatigued quickly and returned to sitting EOB.    Ambulation/Gait               General Gait Details: unsafe/unable   Stairs             Wheelchair Mobility    Modified Rankin (Stroke Patients Only)       Balance Overall balance assessment: Needs assistance Sitting-balance support: No upper extremity supported, Feet supported Sitting balance-Leahy Scale: Fair     Standing balance support: Bilateral upper extremity supported Standing balance-Leahy Scale: Poor Standing balance comment: Leans to the R.                            Cognition Arousal/Alertness: Awake/alert Behavior During Therapy: WFL for tasks assessed/performed Overall Cognitive Status: Within Functional Limits for tasks assessed                                          Exercises      General Comments        Pertinent Vitals/Pain Pain Assessment Pain Assessment: Faces Faces Pain Scale: Hurts little more Pain Location: knees with movement and gum line. Pain Descriptors / Indicators: Aching, Sore Pain Intervention(s): Limited activity within patient's tolerance, Monitored during session, Repositioned    Home Living  Prior Function            PT Goals (current goals can now be found in the care plan section) Acute Rehab PT Goals Patient Stated Goal: to consider STR prior to return to [facility] PT Goal Formulation: With patient/family Time For Goal Achievement: 01/20/23 Potential to Achieve Goals: Fair Progress towards PT goals: Progressing toward goals    Frequency    Min 2X/week      PT Plan      Co-evaluation              AM-PAC PT "6 Clicks" Mobility   Outcome Measure  Help needed  turning from your back to your side while in a flat bed without using bedrails?: A Little Help needed moving from lying on your back to sitting on the side of a flat bed without using bedrails?: A Lot Help needed moving to and from a bed to a chair (including a wheelchair)?: Total Help needed standing up from a chair using your arms (e.g., wheelchair or bedside chair)?: Total Help needed to walk in hospital room?: Total Help needed climbing 3-5 steps with a railing? : Total 6 Click Score: 9    End of Session Equipment Utilized During Treatment: Gait belt Activity Tolerance: Patient limited by fatigue Patient left: in bed;with call bell/phone within reach;with bed alarm set   PT Visit Diagnosis: Muscle weakness (generalized) (M62.81);Difficulty in walking, not elsewhere classified (R26.2)     Time: [id number] PT Time Calculation (min) (ACUTE ONLY): 25 min  Charges:  $Therapeutic Activity: 23-37 mins                     Bjorn Loser, PTA  01/10/23, 3:22 PM

## 2023-01-11 ENCOUNTER — Inpatient Hospital Stay: Payer: Medicare Other

## 2023-01-11 DIAGNOSIS — N139 Obstructive and reflux uropathy, unspecified: Secondary | ICD-10-CM | POA: Diagnosis not present

## 2023-01-11 DIAGNOSIS — R6520 Severe sepsis without septic shock: Secondary | ICD-10-CM | POA: Diagnosis not present

## 2023-01-11 DIAGNOSIS — A419 Sepsis, unspecified organism: Secondary | ICD-10-CM | POA: Diagnosis not present

## 2023-01-11 LAB — CBC
HCT: 31.5 % — ABNORMAL LOW (ref 39.0–52.0)
Hemoglobin: 10.6 g/dL — ABNORMAL LOW (ref 13.0–17.0)
MCH: 28.9 pg (ref 26.0–34.0)
MCHC: 33.7 g/dL (ref 30.0–36.0)
MCV: 85.8 fL (ref 80.0–100.0)
Platelets: 338 10*3/uL (ref 150–400)
RBC: 3.67 MIL/uL — ABNORMAL LOW (ref 4.22–5.81)
RDW: 14.8 % (ref 11.5–15.5)
WBC: 10.3 10*3/uL (ref 4.0–10.5)
nRBC: 0 % (ref 0.0–0.2)

## 2023-01-11 LAB — BASIC METABOLIC PANEL
Anion gap: 4 — ABNORMAL LOW (ref 5–15)
BUN: 16 mg/dL (ref 8–23)
CO2: 25 mmol/L (ref 22–32)
Calcium: 8.2 mg/dL — ABNORMAL LOW (ref 8.9–10.3)
Chloride: 109 mmol/L (ref 98–111)
Creatinine, Ser: 0.88 mg/dL (ref 0.61–1.24)
GFR, Estimated: >60 mL/min (ref >60)
Glucose, Bld: 112 mg/dL — ABNORMAL HIGH (ref 70–99)
Potassium: 3.4 mmol/L — ABNORMAL LOW (ref 3.5–5.1)
Sodium: 138 mmol/L (ref 135–145)

## 2023-01-11 NOTE — Progress Notes (Signed)
PROGRESS NOTE    Johnny Rice  [id number] DOB: 01-06-1946 DOA: 01/05/2023 PCP: [facility]   Assessment & Plan:   Principal Problem:   Severe sepsis ([place]) Active Problems:   Complicated UTI (urinary tract infection)   Obstructive uropathy   Acute metabolic encephalopathy   AKI (acute kidney injury) ([place])   History of stroke   BPH (benign prostatic hyperplasia)   Essential hypertension   Hypothyroid   Tongue laceration   Kidney stone   Sinus bradycardia   Chest discomfort   Bacteremia due to Enterococcus  Assessment and Plan: Severe sepsis: present on admission. Met criteria w/ fever, tachycardia, leukocytosis, AKI, & bacteremia. Blood cxs growing enterococcus. Continue on IV ampicillin as per ID. Urine culture shows no growth    Bacteremia: blood cultures growing enterococcus. Likely secondary to urinary source even though urine cx is neg. ? if abxs were given prior to urine cx was collected. Repeat blood cxs NGTD. Fever again today. Continue on IV amp x 2 weeks as per ID  Acute metabolic encephalopathy: mental status is labile. Possibly secondary to above infection    Obstructive uropathy: CT scan showing a 7 mm obstructing stone at the left UVJ with hydronephrosis and perinephric stranding. S/p left ureteral stent placed by Dr. Erlene Quan on 3/18. Fever again today, CT abd/pelvis ordered. Continue on IV amp x 2 weeks  Dementia: continue w/ supportive care   AKI: resolved    Hx of CVA: continue on statin, aspirin    HTN: continue on lisinopril. Holding BB secondary to bradycardia   BPH: continue on uroxatral   Hypothyroidism: continue on liothyronine  Chest discomfort: likely not cardiac. Troponin neg x 2.    Sinus bradycardia: continue to hold BB   Tongue laceration: continue on viscous lidocaine          DVT prophylaxis: lovenox  Code Status: full  Family Communication:  Disposition Plan:  possibly d/c to SNF   Level of care:  Med-Surg  Status is: Inpatient: fever again today. Repeat CT ordered    Consultants:  ID Urology   Procedures:   Antimicrobials: IV ampicillin    Subjective: Pt c/o pain in both hands  Objective: Vitals:   01/10/23 1517 01/10/23 1903 01/11/23 0425 01/11/23 0530  BP: (!) 148/86 (!) 147/83 (!) 143/92   Pulse: 80 74 87   Resp: 18 20 16    Temp: 99.2 F (37.3 C) 99.4 F (37.4 C) (!) 101 F (38.3 C) 98.1 F (36.7 C)  TempSrc: Oral Oral Oral Oral  SpO2: 100% 97% 97%   Weight:   64.8 kg   Height:        Intake/Output Summary (Last 24 hours) at 01/11/2023 0809 Last data filed at 01/11/2023 0552 Gross per 24 hour  Intake 240 ml  Output 1100 ml  Net -860 ml   Filed Weights   01/09/23 0500 01/10/23 0409 01/11/23 0425  Weight: 81.5 kg 82.4 kg 64.8 kg    Examination:  General exam: appears calm but uncomfortable  Respiratory system: clear breath sounds b/l  Cardiovascular system: S1 & S2+. No rubs or gallops Gastrointestinal system: Abd is soft, NT, ND & normal bowel sounds  Central nervous system: Moves all extremities  Psychiatry: judgement and insight appears at baseline. Flat mood and affect    Data Reviewed: I have personally reviewed following labs and imaging studies  CBC: Recent Labs  Lab 01/05/23 0912 01/06/23 0601 01/07/23 0708 01/08/23 0528 01/09/23 0339 01/10/23 0516 01/11/23 0615  WBC  18.7*   < > 17.9* 11.8* 14.6* 13.0* 10.3  NEUTROABS 16.6*  --   --   --   --   --   --   HGB 12.8*   < > 12.1* 11.6* 11.1* 11.1* 10.6*  HCT 37.0*   < > 36.3* 34.6* 32.6* 33.1* 31.5*  MCV 85.1   < > 87.7 87.2 85.8 87.1 85.8  PLT 248   < > 261 264 260 282 338   < > = values in this interval not displayed.   Basic Metabolic Panel: Recent Labs  Lab 01/05/23 0930 01/05/23 2227 01/07/23 0708 01/08/23 0528 01/09/23 0339 01/10/23 0516 01/11/23 0615  NA  --    < > 139 141 142 140 138  K  --    < > 4.3 3.6 3.6 3.5 3.4*  CL  --    < > 114* 109 104 105 109  CO2   --    < > 21* 23 24 24 25   GLUCOSE  --    < > 106* 89 108* 110* 112*  BUN  --    < > 28* 20 20 21 16   CREATININE  --    < > 0.95 0.93 1.13 1.03 0.88  CALCIUM  --    < > 8.4* 8.5* 8.6* 8.5* 8.2*  MG 1.5*  --   --   --   --   --   --    < > = values in this interval not displayed.   GFR: Estimated Creatinine Clearance: 67.5 mL/min (by C-G formula based on SCr of 0.88 mg/dL). Liver Function Tests: Recent Labs  Lab 01/05/23 0912 01/06/23 0601  AST 25 18  ALT 13 11  ALKPHOS 162* 125  BILITOT 2.7* 1.0  PROT 7.3 6.4*  ALBUMIN 3.7 2.9*   No results for input(s): "LIPASE", "AMYLASE" in the last 168 hours. No results for input(s): "AMMONIA" in the last 168 hours. Coagulation Profile: Recent Labs  Lab 01/05/23 0912  INR 1.3*   Cardiac Enzymes: No results for input(s): "CKTOTAL", "CKMB", "CKMBINDEX", "TROPONINI" in the last 168 hours. BNP (last 3 results) No results for input(s): "PROBNP" in the last 8760 hours. HbA1C: No results for input(s): "HGBA1C" in the last 72 hours. CBG: No results for input(s): "GLUCAP" in the last 168 hours. Lipid Profile: No results for input(s): "CHOL", "HDL", "LDLCALC", "TRIG", "CHOLHDL", "LDLDIRECT" in the last 72 hours. Thyroid Function Tests: No results for input(s): "TSH", "T4TOTAL", "FREET4", "T3FREE", "THYROIDAB" in the last 72 hours. Anemia Panel: No results for input(s): "VITAMINB12", "FOLATE", "FERRITIN", "TIBC", "IRON", "RETICCTPCT" in the last 72 hours. Sepsis Labs: Recent Labs  Lab 01/05/23 0912 01/05/23 1125  LATICACIDVEN 1.8 1.1    Recent Results (from the past 240 hour(s))  Resp panel by RT-PCR (RSV, Flu A&B, Covid) Anterior Nasal Swab     Status: None   Collection Time: 01/05/23  9:10 AM   Specimen: Anterior Nasal Swab  Result Value Ref Range Status   SARS Coronavirus 2 by RT PCR NEGATIVE NEGATIVE Final    Comment: (NOTE) SARS-CoV-2 target nucleic acids are NOT DETECTED.  The SARS-CoV-2 RNA is generally detectable in  upper respiratory specimens during the acute phase of infection. The lowest concentration of SARS-CoV-2 viral copies this assay can detect is 138 copies/mL. A negative result does not preclude SARS-Cov-2 infection and should not be used as the sole basis for treatment or other patient management decisions. A negative result may occur with  improper specimen collection/handling, submission  of specimen other than nasopharyngeal swab, presence of viral mutation(s) within the areas targeted by this assay, and inadequate number of viral copies(<138 copies/mL). A negative result must be combined with clinical observations, patient history, and epidemiological information. The expected result is Negative.  Fact Sheet for Patients:  EntrepreneurPulse.com.au  Fact Sheet for Healthcare Providers:  IncredibleEmployment.be  This test is no t yet approved or cleared by the [place] FDA and  has been authorized for detection and/or diagnosis of SARS-CoV-2 by FDA under an Emergency Use Authorization (EUA). This EUA will remain  in effect (meaning this test can be used) for the duration of the COVID-19 declaration under Section 564(b)(1) of the Act, 21 U.S.C.section 360bbb-3(b)(1), unless the authorization is terminated  or revoked sooner.       Influenza A by PCR NEGATIVE NEGATIVE Final   Influenza B by PCR NEGATIVE NEGATIVE Final    Comment: (NOTE) The Xpert Xpress SARS-CoV-2/FLU/RSV plus assay is intended as an aid in the diagnosis of influenza from Nasopharyngeal swab specimens and should not be used as a sole basis for treatment. Nasal washings and aspirates are unacceptable for Xpert Xpress SARS-CoV-2/FLU/RSV testing.  Fact Sheet for Patients: EntrepreneurPulse.com.au  Fact Sheet for Healthcare Providers: IncredibleEmployment.be  This test is not yet approved or cleared by the [place] FDA and has been  authorized for detection and/or diagnosis of SARS-CoV-2 by FDA under an Emergency Use Authorization (EUA). This EUA will remain in effect (meaning this test can be used) for the duration of the COVID-19 declaration under Section 564(b)(1) of the Act, 21 U.S.C. section 360bbb-3(b)(1), unless the authorization is terminated or revoked.     Resp Syncytial Virus by PCR NEGATIVE NEGATIVE Final    Comment: (NOTE) Fact Sheet for Patients: EntrepreneurPulse.com.au  Fact Sheet for Healthcare Providers: IncredibleEmployment.be  This test is not yet approved or cleared by the [place] FDA and has been authorized for detection and/or diagnosis of SARS-CoV-2 by FDA under an Emergency Use Authorization (EUA). This EUA will remain in effect (meaning this test can be used) for the duration of the COVID-19 declaration under Section 564(b)(1) of the Act, 21 U.S.C. section 360bbb-3(b)(1), unless the authorization is terminated or revoked.  Performed at [facility], [address] [place], [place] [address]   Blood Culture (routine x 2)     Status: Abnormal   Collection Time: 01/05/23  9:30 AM   Specimen: BLOOD  Result Value Ref Range Status   Specimen Description   Final    BLOOD LEFT ANTECUBITAL Performed at [facility], [address] [place], [place] [address]    Special Requests   Final    BOTTLES DRAWN AEROBIC AND ANAEROBIC Blood Culture results may not be optimal due to an excessive volume of blood received in culture bottles Performed at [facility], [address] [place], [place] [address]    Culture  Setup Time   Final    GRAM POSITIVE COCCI AEROBIC BOTTLE ONLY CRITICAL VALUE NOTED.  VALUE IS CONSISTENT WITH PREVIOUSLY REPORTED AND CALLED VALUE. Performed at [facility], [address] [place], [place] [address]    Culture (A)  Final    ENTEROCOCCUS FAECALIS SUSCEPTIBILITIES PERFORMED ON  PREVIOUS CULTURE WITHIN THE LAST 5 DAYS. Performed at [facility], [place] [address], [place],  [address]    Report Status 01/08/2023 FINAL  Final  Blood Culture (routine x 2)     Status: Abnormal   Collection Time: 01/05/23  9:30 AM   Specimen: BLOOD  Result Value Ref Range Status   Specimen Description   Final    BLOOD RIGHT ANTECUBITAL Performed at [facility], [address] [place], [place] [address]    Special Requests   Final    BOTTLES DRAWN AEROBIC AND ANAEROBIC Blood Culture results may not be optimal due to an excessive volume of blood received in culture bottles Performed at [facility], [address], [place], [place] [address]    Culture  Setup Time   Final    GRAM POSITIVE COCCI IN BOTH AEROBIC AND ANAEROBIC BOTTLES Organism ID to follow CRITICAL RESULT CALLED TO, READ BACK BY AND VERIFIED WITH: NATHAN BLUE@0529  01/06/23 RH Performed at [facility], [address] [place], [place] [address]    Culture ENTEROCOCCUS FAECALIS (A)  Final   Report Status 01/08/2023 FINAL  Final   Organism ID, Bacteria ENTEROCOCCUS FAECALIS  Final      Susceptibility   Enterococcus faecalis - MIC*    AMPICILLIN <=2 SENSITIVE Sensitive     VANCOMYCIN 1 SENSITIVE Sensitive     GENTAMICIN SYNERGY SENSITIVE Sensitive     * ENTEROCOCCUS FAECALIS  Blood Culture ID Panel (Reflexed)     Status: Abnormal   Collection Time: 01/05/23  9:30 AM  Result Value Ref Range Status   Enterococcus faecalis DETECTED (A) NOT DETECTED Final    Comment: CRITICAL RESULT CALLED TO, READ BACK BY AND VERIFIED WITH: NATHAN BLUE@0529  01/06/23 RH    Enterococcus Faecium NOT DETECTED NOT DETECTED Final   Listeria monocytogenes NOT DETECTED NOT DETECTED Final   Staphylococcus species NOT DETECTED NOT DETECTED Final   Staphylococcus aureus (BCID) NOT DETECTED NOT DETECTED Final   Staphylococcus epidermidis NOT DETECTED NOT DETECTED Final   Staphylococcus lugdunensis NOT  DETECTED NOT DETECTED Final   Streptococcus species NOT DETECTED NOT DETECTED Final   Streptococcus agalactiae NOT DETECTED NOT DETECTED Final   Streptococcus pneumoniae NOT DETECTED NOT DETECTED Final   Streptococcus pyogenes NOT DETECTED NOT DETECTED Final   A.calcoaceticus-baumannii NOT DETECTED NOT DETECTED Final   Bacteroides fragilis NOT DETECTED NOT DETECTED Final   Enterobacterales NOT DETECTED NOT DETECTED Final   Enterobacter cloacae complex NOT DETECTED NOT DETECTED Final   Escherichia coli NOT DETECTED NOT DETECTED Final   Klebsiella aerogenes NOT DETECTED NOT DETECTED Final   Klebsiella oxytoca NOT DETECTED NOT DETECTED Final   Klebsiella pneumoniae NOT DETECTED NOT DETECTED Final   Proteus species NOT DETECTED NOT DETECTED Final   Salmonella species NOT DETECTED NOT DETECTED Final   Serratia marcescens NOT DETECTED NOT DETECTED Final   Haemophilus influenzae NOT DETECTED NOT DETECTED Final   Neisseria meningitidis NOT DETECTED NOT DETECTED Final   Pseudomonas aeruginosa NOT DETECTED NOT DETECTED Final   Stenotrophomonas maltophilia NOT DETECTED NOT DETECTED Final   Candida albicans NOT DETECTED NOT DETECTED Final   Candida auris NOT DETECTED NOT DETECTED Final   Candida glabrata NOT DETECTED NOT DETECTED Final   Candida krusei NOT DETECTED NOT DETECTED Final   Candida parapsilosis NOT DETECTED NOT DETECTED Final   Candida tropicalis NOT DETECTED NOT DETECTED Final   Cryptococcus neoformans/gattii NOT DETECTED NOT DETECTED Final   Vancomycin resistance NOT DETECTED NOT DETECTED Final    Comment: Performed at [facility], [address] [place], [place] [address]  Urine Culture     Status: None   Collection Time: 01/05/23  1:59 PM   Specimen: Urine, Random  Result Value Ref Range Status  Specimen Description   Final    URINE, RANDOM Performed at [facility], [address], [place], [place] [address]    Special Requests LEFT URETER  Final    Culture   Final    NO GROWTH Performed at [facility], [place] [address], [place], [place] [address]    Report Status 01/06/2023 FINAL  Final  Culture, blood (Routine X 2) w Reflex to ID Panel     Status: None (Preliminary result)   Collection Time: 01/07/23  2:45 PM   Specimen: BLOOD  Result Value Ref Range Status   Specimen Description BLOOD BLOOD RIGHT HAND  Final   Special Requests   Final    BOTTLES DRAWN AEROBIC ONLY Blood Culture results may not be optimal due to an inadequate volume of blood received in culture bottles   Culture   Final    NO GROWTH 4 DAYS Performed at [facility], [address] [place], [place] [address]    Report Status PENDING  Incomplete  Culture, blood (Routine X 2) w Reflex to ID Panel     Status: None (Preliminary result)   Collection Time: 01/07/23  2:46 PM   Specimen: BLOOD LEFT HAND  Result Value Ref Range Status   Specimen Description BLOOD LEFT HAND BLOOD LEFT HAND  Final   Special Requests   Final    BOTTLES DRAWN AEROBIC ONLY Blood Culture results may not be optimal due to an inadequate volume of blood received in culture bottles   Culture   Final    NO GROWTH 4 DAYS Performed at [facility], [address] [place],  [address]    Report Status PENDING  Incomplete         Radiology Studies: ECHOCARDIOGRAM COMPLETE  Result Date: 01/10/2023    ECHOCARDIOGRAM REPORT   Patient Name:   BRANT MILARDO Date of Exam: 01/10/2023 Medical Rec #:  [id number]   Height:       67.0 in Accession #:    [id number]  Weight:       181.7 lb Date of Birth:  1945/11/12  BSA:          1.941 m Patient Age:    51 years    BP:           151/64 mmHg Patient Gender: M           HR:           63 bpm. Exam Location:  [facility] Procedure: 2D Echo, Color Doppler and Cardiac Doppler Indications:     Bacteremia R78.81  History:         Patient has no prior history of Echocardiogram examinations.                  Stroke; Risk Factors:Hypertension.   Sonographer:     L. Thornton-Maynard Referring Phys:  [id number] Tsosie Billing Diagnosing Phys: Ida Rogue MD IMPRESSIONS  1. Left ventricular ejection fraction, by estimation, is 55 to 60%. The left ventricle has normal function. The left ventricle has no regional wall motion abnormalities. There is moderate asymmetric left ventricular hypertrophy of the basal-septal segment. Left ventricular diastolic parameters are consistent with Grade I diastolic dysfunction (impaired relaxation).  2. Right ventricular systolic function is normal. The right ventricular size is normal. There is normal pulmonary artery systolic pressure.  3. The mitral valve is normal in structure. Mild mitral valve regurgitation. No evidence of mitral stenosis.  4. The aortic valve is normal  in structure. Aortic valve regurgitation is mild. No aortic stenosis is present.  5. The inferior vena cava is normal in size with greater than 50% respiratory variability, suggesting right atrial pressure of 3 mmHg.  6. No valve vegetation noted. FINDINGS  Left Ventricle: Left ventricular ejection fraction, by estimation, is 55 to 60%. The left ventricle has normal function. The left ventricle has no regional wall motion abnormalities. The left ventricular internal cavity size was normal in size. There is  moderate asymmetric left ventricular hypertrophy of the basal-septal segment. Left ventricular diastolic parameters are consistent with Grade I diastolic dysfunction (impaired relaxation). Right Ventricle: The right ventricular size is normal. No increase in right ventricular wall thickness. Right ventricular systolic function is normal. There is normal pulmonary artery systolic pressure. The tricuspid regurgitant velocity is 2.15 m/s, and  with an assumed right atrial pressure of 3 mmHg, the estimated right ventricular systolic pressure is [id number] mmHg. Left Atrium: Left atrial size was normal in size. Right Atrium: Right atrial size was normal in  size. Pericardium: There is no evidence of pericardial effusion. Mitral Valve: The mitral valve is normal in structure. Mild mitral valve regurgitation. No evidence of mitral valve stenosis. There is no evidence of mitral valve vegetation. Tricuspid Valve: The tricuspid valve is normal in structure. Tricuspid valve regurgitation is not demonstrated. No evidence of tricuspid stenosis. There is no evidence of tricuspid valve vegetation. Aortic Valve: The aortic valve is normal in structure. Aortic valve regurgitation is mild. Aortic regurgitation PHT measures 1183 msec. No aortic stenosis is present. Aortic valve mean gradient measures 3.5 mmHg. Aortic valve peak gradient measures 6.4 mmHg. Aortic valve area, by VTI measures 1.57 cm. There is no evidence of aortic valve vegetation. Pulmonic Valve: The pulmonic valve was normal in structure. Pulmonic valve regurgitation is not visualized. No evidence of pulmonic stenosis. There is no evidence of pulmonic valve vegetation. Aorta: The aortic root is normal in size and structure. Venous: The inferior vena cava is normal in size with greater than 50% respiratory variability, suggesting right atrial pressure of 3 mmHg. IAS/Shunts: No atrial level shunt detected by color flow Doppler.  LEFT VENTRICLE PLAX 2D LVIDd:         3.80 cm     Diastology LVIDs:         2.40 cm     LV e' medial:    5.00 cm/s LV PW:         0.90 cm     LV E/e' medial:  13.3 LV IVS:        1.60 cm     LV e' lateral:   7.18 cm/s LVOT diam:     1.90 cm     LV E/e' lateral: 9.3 LV SV:         44 LV SV Index:   23 LVOT Area:     2.84 cm  LV Volumes (MOD) LV vol d, MOD A2C: 77.1 ml LV vol d, MOD A4C: 74.6 ml LV vol s, MOD A2C: 26.2 ml LV vol s, MOD A4C: 18.9 ml LV SV MOD A2C:     50.9 ml LV SV MOD A4C:     74.6 ml LV SV MOD BP:      54.9 ml RIGHT VENTRICLE             IVC RV S prime:     21.10 cm/s  IVC diam: 2.00 cm TAPSE (M-mode): 3.4 cm LEFT ATRIUM  Index LA diam:        2.40 cm 1.24 cm/m LA  Vol (A2C):   43.8 ml 22.56 ml/m LA Vol (A4C):   38.8 ml 19.99 ml/m LA Biplane Vol: 45.3 ml 23.33 ml/m  AORTIC VALVE                    PULMONIC VALVE AV Area (Vmax):    1.90 cm     PV Vmax:       1.07 m/s AV Area (Vmean):   1.77 cm     PV Peak grad:  4.6 mmHg AV Area (VTI):     1.57 cm AV Vmax:           126.50 cm/s AV Vmean:          86.500 cm/s AV VTI:            0.280 m AV Peak Grad:      6.4 mmHg AV Mean Grad:      3.5 mmHg LVOT Vmax:         84.90 cm/s LVOT Vmean:        54.000 cm/s LVOT VTI:          0.155 m LVOT/AV VTI ratio: 0.55 AI PHT:            1183 msec  AORTA Ao Root diam: 3.60 cm Ao Asc diam:  3.30 cm MITRAL VALVE                TRICUSPID VALVE MV Area (PHT): 3.68 cm     TR Peak grad:   18.5 mmHg MV Decel Time: 206 msec     TR Vmax:        215.00 cm/s MV E velocity: 66.60 cm/s MV A velocity: 113.00 cm/s  SHUNTS MV E/A ratio:  0.59         Systemic VTI:  0.16 m                             Systemic Diam: 1.90 cm Ida Rogue MD Electronically signed by Ida Rogue MD Signature Date/Time: 01/10/2023/2:19:31 PM    Final         Scheduled Meds:  alfuzosin  10 mg Oral q AM   ARIPiprazole  15 mg Oral Daily   aspirin  81 mg Oral Daily   atorvastatin  20 mg Oral QHS   clonazePAM  1 mg Oral TID   enoxaparin (LOVENOX) injection  40 mg Subcutaneous Q24H   feeding supplement  237 mL Oral TID BM   liothyronine  25 mcg Oral Daily   lisinopril  40 mg Oral Daily   multivitamin with minerals  1 tablet Oral Daily   polyethylene glycol  17 g Oral QODAY   traZODone  50 mg Oral QHS   Continuous Infusions:  ampicillin (OMNIPEN) IV 2 g (01/11/23 0424)     LOS: 6 days    Time spent: 25 mins    Wyvonnia Dusky, MD [facility] Pager [phone]  If 7PM-7AM, please contact night-coverage [email or web address] 01/11/2023, 8:09 AM

## 2023-01-11 NOTE — TOC Progression Note (Signed)
Transition of Care [facility]) - Progression Note    Patient Details  Name: Johnny Rice MRN: [id number] Date of Birth: 11/09/1945  [facility] of Care [facility] CM/SW Contact  Valente David, RN Phone Number: 01/11/2023, 4:28 PM  Clinical Narrative:      Bed offers [facility]) discussed with daughter [name], state she is wanting to look online at reviews and ratings prior to making decision.  TOC will continue to follow.        Expected Discharge Plan and Services                                               Social Determinants of Health (SDOH) Interventions SDOH Screenings   Food Insecurity: Patient Unable To Answer (01/05/2023)  Housing: Low Risk  (01/05/2023)  Transportation Needs: Patient Unable To Answer (01/05/2023)  Utilities: Not At Risk (01/05/2023)  Tobacco Use: Low Risk  (01/06/2023)    Readmission Risk Interventions    01/07/2023    4:06 PM  Readmission Risk Prevention Plan  Transportation Screening Complete  PCP or Specialist appointment within 3-5 days of discharge Complete  HRI or [place] Complete  SW Recovery Care/Counseling Consult Complete  [place] Complete

## 2023-01-11 NOTE — Progress Notes (Signed)
       CROSS COVER NOTE  NAME: Johnny Rice MRN: [id number] DOB : 08-Feb-1946 ATTENDING PHYSICIAN: Wyvonnia Dusky, MD    Date of Service   01/11/2023   HPI/Events of Note   Report /Request "Telemetry called re: patient here with sepsis d/t obstructing kidney stone with stent placement had a pause of 4.42 seconds. Patient is asymptomatic, VS WNL, sleeping"  On Review of chart Johnny Rice also had a 4 second pause during July 2023 admission. TSH normal in January at 1.3   Interventions   Assessment/Plan:  EKG Continue to hold beta blocker Check Electrolytes with AM Labs --> Mg 1.6; 2g IV ordered      To reach the provider On-Call:   7AM- 7PM see care teams to locate the attending and reach out to them via [email or web address] [email or web address] 7PM-7AM contact night-coverage If you still have difficulty reaching the appropriate provider, please page the [facility] (Director on Call) for [facility] on amion for assistance  This document was prepared using Systems analyst and may include unintentional dictation errors.  Johnny Glass DNP, MBA, FNP-BC, PMHNP-BC Nurse Practitioner [facility] Pager [phone]

## 2023-01-12 ENCOUNTER — Inpatient Hospital Stay: Payer: Medicare Other

## 2023-01-12 DIAGNOSIS — R7881 Bacteremia: Secondary | ICD-10-CM | POA: Diagnosis not present

## 2023-01-12 DIAGNOSIS — G9341 Metabolic encephalopathy: Secondary | ICD-10-CM | POA: Diagnosis not present

## 2023-01-12 DIAGNOSIS — N200 Calculus of kidney: Secondary | ICD-10-CM | POA: Diagnosis not present

## 2023-01-12 DIAGNOSIS — A419 Sepsis, unspecified organism: Secondary | ICD-10-CM | POA: Diagnosis not present

## 2023-01-12 DIAGNOSIS — R6520 Severe sepsis without septic shock: Secondary | ICD-10-CM | POA: Diagnosis not present

## 2023-01-12 DIAGNOSIS — N139 Obstructive and reflux uropathy, unspecified: Secondary | ICD-10-CM | POA: Diagnosis not present

## 2023-01-12 DIAGNOSIS — N179 Acute kidney failure, unspecified: Secondary | ICD-10-CM | POA: Diagnosis not present

## 2023-01-12 LAB — BASIC METABOLIC PANEL
Anion gap: 13 (ref 5–15)
BUN: 15 mg/dL (ref 8–23)
CO2: 24 mmol/L (ref 22–32)
Calcium: 8.6 mg/dL — ABNORMAL LOW (ref 8.9–10.3)
Chloride: 101 mmol/L (ref 98–111)
Creatinine, Ser: 0.86 mg/dL (ref 0.61–1.24)
GFR, Estimated: >60 mL/min (ref >60)
Glucose, Bld: 107 mg/dL — ABNORMAL HIGH (ref 70–99)
Potassium: 3.7 mmol/L (ref 3.5–5.1)
Sodium: 138 mmol/L (ref 135–145)

## 2023-01-12 LAB — CBC
HCT: 31.5 % — ABNORMAL LOW (ref 39.0–52.0)
Hemoglobin: 10.5 g/dL — ABNORMAL LOW (ref 13.0–17.0)
MCH: 28.9 pg (ref 26.0–34.0)
MCHC: 33.3 g/dL (ref 30.0–36.0)
MCV: 86.8 fL (ref 80.0–100.0)
Platelets: 372 10*3/uL (ref 150–400)
RBC: 3.63 MIL/uL — ABNORMAL LOW (ref 4.22–5.81)
RDW: 15 % (ref 11.5–15.5)
WBC: 10.8 10*3/uL — ABNORMAL HIGH (ref 4.0–10.5)
nRBC: 0 % (ref 0.0–0.2)

## 2023-01-12 LAB — RESP PANEL BY RT-PCR (FLU A&B, COVID) ARPGX2
Influenza A by PCR: NEGATIVE
Influenza B by PCR: NEGATIVE
SARS Coronavirus 2 by RT PCR: NEGATIVE

## 2023-01-12 LAB — CULTURE, BLOOD (ROUTINE X 2)
Culture: NO GROWTH
Culture: NO GROWTH

## 2023-01-12 LAB — MAGNESIUM: Magnesium: 1.6 mg/dL — ABNORMAL LOW (ref 1.7–2.4)

## 2023-01-12 LAB — SARS CORONAVIRUS 2 BY RT PCR: SARS Coronavirus 2 by RT PCR: NEGATIVE

## 2023-01-12 MED ORDER — MAGNESIUM SULFATE 2 GM/50ML IV SOLN
2.0000 g | Freq: Once | INTRAVENOUS | Status: AC
  Administered 2023-01-12: 2 g via INTRAVENOUS
  Filled 2023-01-12: qty 50

## 2023-01-12 MED ORDER — CHLORPROMAZINE HCL 25 MG PO TABS
25.0000 mg | ORAL_TABLET | Freq: Three times a day (TID) | ORAL | Status: DC | PRN
  Administered 2023-01-12 – 2023-01-15 (×3): 25 mg via ORAL
  Filled 2023-01-12 (×5): qty 1

## 2023-01-12 NOTE — TOC Progression Note (Signed)
Transition of Care [facility]) - Progression Note    Patient Details  Name: Johnny Rice MRN: [id number] Date of Birth: 12-Jul-1946  Transition of Care [facility]) CM/SW [place], LCSW Phone Number: 01/12/2023, 1:00 PM  Clinical Narrative: [facility] has also offered a bed. Daughter is going to research this facility as well and call back.    Expected Discharge Plan and Services                                               Social Determinants of Health (SDOH) Interventions SDOH Screenings   Food Insecurity: Patient Unable To Answer (01/05/2023)  Housing: Low Risk  (01/05/2023)  Transportation Needs: Patient Unable To Answer (01/05/2023)  Utilities: Not At Risk (01/05/2023)  Tobacco Use: Low Risk  (01/06/2023)    Readmission Risk Interventions    01/07/2023    4:06 PM  Readmission Risk Prevention Plan  Transportation Screening Complete  PCP or Specialist appointment within 3-5 days of discharge Complete  HRI or [place] Complete  SW Recovery Care/Counseling Consult Complete  [place] Complete

## 2023-01-12 NOTE — Progress Notes (Signed)
Pt periodically wakes up. Opens eyes to voice and name. Daughter called for update. Updated but informed that results would have to come from DR. Daughter informed patient has been drowsy most of day. Klonopin was held due to drowsiness. Daughter wanted med given. Informed daughter about contraindicated in pt who are drowsy. Informed pt I would reassess pt again and give if increase in alertness. Reassessed and Agricultural consultant updated. Not given per Nursing judgement. Med was held at lunch as well.

## 2023-01-12 NOTE — Care Management Important Message (Signed)
Important Message  Patient Details  Name: Kamarie Kammeyer MRN: [id number] Date of Birth: 03/06/1946   Medicare Important Message Given:  Yes     Dannette Barbara 01/12/2023, 12:46 PM

## 2023-01-12 NOTE — Progress Notes (Signed)
Occupational Therapy Treatment Patient Details Name: Johnny Rice MRN: [id number] DOB: 05-Nov-1945 Today's Date: 01/12/2023   History of present illness presented to ER secondary to fever, nausea/vomiting; admitted for sepsis, encephalopathy related to obstructive uropathy.   OT comments  Patient received in semi-fowler's position turned toward R side. Pt endorsed neck pain upon arrival to room and agreeable to be repositioned in bed. OT provided Total A for bed mobility and placement of pillows to encourage midline position and increase attention to L side. Pt deferred self-care tasks this date, however, was agreeable to bed level PROM exercises (see details below). Pt left as received with all needs in reach. Pt will benefit from continued skilled OT services to address noted deficits. OT will continue to follow acutely.    Recommendations for follow up therapy are one component of a multi-disciplinary discharge planning process, led by the attending physician.  Recommendations may be updated based on patient status, additional functional criteria and insurance authorization.    Assistance Recommended at Discharge Frequent or constant Supervision/Assistance  Patient can return home with the following  Two people to help with walking and/or transfers;Two people to help with bathing/dressing/bathroom;Direct supervision/assist for financial management;Direct supervision/assist for medications management;Assistance with cooking/housework;Assist for transportation;Help with stairs or ramp for entrance   Equipment Recommendations  Other (comment) (defer to next venue of care)    Recommendations for Other Services      Precautions / Restrictions Precautions Precautions: Fall Restrictions Weight Bearing Restrictions: No       Mobility Bed Mobility Overal bed mobility: Needs Assistance Bed Mobility: Rolling Rolling: Total assist         General bed mobility comments: Max A required to  reposition pt in bed. Pillows added to R side to encourage midline position and increase attention to L side.    Transfers         Balance             ADL either performed or assessed with clinical judgement   ADL Overall ADL's : Needs assistance/impaired       General ADL Comments: Pt continues to be functionally limited 2/2 weakness, decreased endurance, and impaired cognition. RN reported pt just received bed bath. Pt deferred ADL tasks this date 2/2 pain.    Extremity/Trunk Assessment Upper Extremity Assessment Upper Extremity Assessment: Generalized weakness   Lower Extremity Assessment Lower Extremity Assessment: Generalized weakness        Vision Baseline Vision/History: 1 Wears glasses Patient Visual Report: No change from baseline Vision Assessment?: Vision impaired- to be further tested in functional context Additional Comments: L side inattention noted, pt positioned with head turned all the way to R side. Multimodal cues to encourage pt to look toward L side, however, unsuccessful. 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   Perception     Praxis      Cognition Arousal/Alertness: Lethargic Behavior During Therapy: Flat affect Overall Cognitive Status: Difficult to assess         General Comments: Pt stating "I need help" when therapist entering room, however, difficulty with word finding to describe what he needs.        Exercises General Exercises - Upper Extremity Shoulder Flexion: PROM, Both, 5 reps, Supine (to ~90 deg) Elbow Flexion: PROM, Both, 5 reps, Supine Elbow Extension: PROM, Both, 5 reps, Supine Wrist Flexion: PROM, Both, 5 reps, Supine Wrist Extension: PROM, Both, 5 reps, Supine Digit Composite Flexion: PROM, Both, 5 reps, Supine Composite Extension: PROM, Both, 5 reps,  Supine Other Exercises Other Exercises: OT instructed pt in BUE PROM exercises (x5 each) while in supine. Pt endorsed pain with L WF and SF.    Shoulder  Instructions       General Comments      Pertinent Vitals/ Pain       Pain Assessment Pain Assessment: Faces Faces Pain Scale: Hurts even more Pain Location: neck Pain Descriptors / Indicators: Aching, Sore, Grimacing Pain Intervention(s): Limited activity within patient's tolerance, Monitored during session, Repositioned, RN gave pain meds during session  Home Living              Prior Functioning/Environment              Frequency  Min 2X/week        Progress Toward Goals  OT Goals(current goals can now be found in the care plan section)  Progress towards OT goals: Progressing toward goals  Acute Rehab OT Goals Patient Stated Goal: get stronger, go to STR prior to return to [facility] OT Goal Formulation: With patient/family Time For Goal Achievement: 01/20/23 Potential to Achieve Goals: [place] Discharge plan remains appropriate;Frequency remains appropriate    Co-evaluation                 AM-PAC OT "6 Clicks" Daily Activity     Outcome Measure   Help from another person eating meals?: A Lot Help from another person taking care of personal grooming?: A Lot Help from another person toileting, which includes using toliet, bedpan, or urinal?: Total Help from another person bathing (including washing, rinsing, drying)?: Total Help from another person to put on and taking off regular upper body clothing?: A Lot Help from another person to put on and taking off regular lower body clothing?: Total 6 Click Score: 9    End of Session    OT Visit Diagnosis: Other abnormalities of gait and mobility (R26.89);Muscle weakness (generalized) (M62.81)   Activity Tolerance Patient limited by pain;Patient limited by lethargy   Patient Left in bed;with call bell/phone within reach;with bed alarm set;Other (comment) (RN present to give pain meds)   Nurse Communication Mobility status        Time: [id number] OT Time Calculation (min): 12  min  Charges: OT General Charges $OT Visit: 1 Visit OT Treatments $Therapeutic Exercise: 8-22 mins  [facility] MS, OTR/L ascom [phone]  01/12/23, 6:23 PM

## 2023-01-12 NOTE — Progress Notes (Signed)
Date of Admission:  01/05/2023      ID: Johnny Rice is a 77 y.o. male  Principal Problem:   Severe sepsis ([place]) Active Problems:   BPH (benign prostatic hyperplasia)   History of stroke   Essential hypertension   Complicated UTI (urinary tract infection)   Hypothyroid   Obstructive uropathy   AKI (acute kidney injury) ([place])   Acute metabolic encephalopathy   Tongue laceration   Kidney stone   Sinus bradycardia   Chest discomfort   Bacteremia due to Enterococcus  Lethargic today  Medications:   alfuzosin  10 mg Oral q AM   ARIPiprazole  15 mg Oral Daily   aspirin  81 mg Oral Daily   atorvastatin  20 mg Oral QHS   clonazePAM  1 mg Oral TID   enoxaparin (LOVENOX) injection  40 mg Subcutaneous Q24H   feeding supplement  237 mL Oral TID BM   liothyronine  25 mcg Oral Daily   lisinopril  40 mg Oral Daily   multivitamin with minerals  1 tablet Oral Daily   polyethylene glycol  17 g Oral QODAY   traZODone  50 mg Oral QHS    Objective: Vital signs in last 24 hours: Patient Vitals for the past 24 hrs:  BP Temp Temp src Pulse Resp SpO2 Weight  01/12/23 1036 124/79 (!) 100.6 F (38.1 C) -- 95 14 95 % --  01/12/23 0838 (!) 157/86 99 F (37.2 C) -- 96 18 95 % --  01/12/23 0500 -- -- -- -- -- -- 64.5 kg  01/12/23 0348 (!) 157/85 99.5 F (37.5 C) Oral 94 18 96 % --  01/11/23 1952 (!) 145/119 98.6 F (37 C) Oral 80 18 96 % --  01/11/23 1549 121/72 98.8 F (37.1 C) Oral 68 18 97 % --      PHYSICAL EXAM: lethargic Not verbal But as per nurse he has taken klonopin this morning  Chest b/l air entry Hss1s2 Abd soft Lymph: Cervical, supraclavicular normal. Neurologic: left facial palsy   Lab Results    Latest Ref Rng & Units 01/12/2023    5:57 AM 01/11/2023    6:15 AM 01/10/2023    5:16 AM  CBC  WBC 4.0 - 10.5 K/uL 10.8  10.3  13.0   Hemoglobin 13.0 - 17.0 g/dL 10.5  10.6  11.1   Hematocrit 39.0 - 52.0 % 31.5  31.5  33.1   Platelets 150 - 400 K/uL 372  338  282         Latest Ref Rng & Units 01/12/2023    5:57 AM 01/11/2023    6:15 AM 01/10/2023    5:16 AM  CMP  Glucose 70 - 99 mg/dL 107  112  110   BUN 8 - 23 mg/dL 15  16  21    Creatinine 0.61 - 1.24 mg/dL 0.86  0.88  1.03   Sodium 135 - 145 mmol/L 138  138  140   Potassium 3.5 - 5.1 mmol/L 3.7  3.4  3.5   Chloride 98 - 111 mmol/L 101  109  105   CO2 22 - 32 mmol/L 24  25  24    Calcium 8.9 - 10.3 mg/dL 8.6  8.2  8.5       Microbiology: 01/05/23 BC Enterococcus bacteremia 01/07/23 BCNG   Assessment/Plan:  Encephalopathy likely due to medication- Chlorpromazine, oxycodone, Klonipin CT head no acute findings- old infarcts  Sepsis secondary to complicated UTI from obstructive uropathy due to a stone  on the left side causing hydronephrosis.  Status post stent placement.   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

## 2023-01-12 NOTE — Progress Notes (Signed)
Physical Therapy Treatment Patient Details Name: Johnny Rice MRN: [id number] DOB: 04-Feb-1946 Today's Date: 01/12/2023   History of Present Illness presented to ER secondary to fever, nausea/vomiting; admitted for sepsis, encephalopathy related to obstructive uropathy.    PT Comments    Patient received in bed, stating " I need help." When asked what he needs help with he cannot respond. Patient says "yes" when asked if he would like to sit up. Patient is HOH. Required total assist to get seated edge of bed and was not able to maintain sitting balance without external support. Patient appears very weak/lethargic. He will continue to benefit from skilled PT to improve strength, independence and safety.       Recommendations for follow up therapy are one component of a multi-disciplinary discharge planning process, led by the attending physician.  Recommendations may be updated based on patient status, additional functional criteria and insurance authorization.  Follow Up Recommendations  Can patient physically be transported by private vehicle: No    Assistance Recommended at Discharge Frequent or constant Supervision/Assistance  Patient can return home with the following Two people to help with walking and/or transfers;Two people to help with bathing/dressing/bathroom   Equipment Recommendations  None recommended by PT    Recommendations for Other Services       Precautions / Restrictions Precautions Precautions: Fall Restrictions Weight Bearing Restrictions: No     Mobility  Bed Mobility Overal bed mobility: Needs Assistance Bed Mobility: Supine to Sit, Sit to Supine     Supine to sit: Total assist Sit to supine: Total assist   General bed mobility comments: Poor sitting balance, able to sit unsupported for a minute maybe. No reports of dizziness.    Transfers                   General transfer comment: Unable to attempt due to poor sitting balance. Poor level  of alertness at time of session. Fatigues quickly in sitting.    Ambulation/Gait               General Gait Details: unsafe/unable   Stairs             Wheelchair Mobility    Modified Rankin (Stroke Patients Only)       Balance Overall balance assessment: Needs assistance Sitting-balance support: Feet supported, No upper extremity supported Sitting balance-Leahy Scale: Poor Sitting balance - Comments: required external support to maintain sitting balance. Postural control: Right lateral lean                                  Cognition Arousal/Alertness: Lethargic Behavior During Therapy: Flat affect Overall Cognitive Status: Difficult to assess                                 General Comments: stating "I need help" when I went into room, not able to elaborate. Agrees to sit up but really made no effort to assist.        Exercises      General Comments        Pertinent Vitals/Pain Pain Assessment Pain Location: LEs with movement out of bed Pain Descriptors / Indicators: Grimacing, Moaning Pain Intervention(s): Monitored during session, Repositioned    Home Living  Prior Function            PT Goals (current goals can now be found in the care plan section) Acute Rehab PT Goals Patient Stated Goal: to consider STR prior to return to [facility] PT Goal Formulation: Patient unable to participate in goal setting Time For Goal Achievement: 01/20/23 Potential to Achieve Goals: [place] Progress towards PT goals: Not progressing toward goals - comment (limited by lethargy this session)    Frequency    Min 2X/week      PT Plan Current plan remains appropriate    Co-evaluation              AM-PAC PT "6 Clicks" Mobility   Outcome Measure  Help needed turning from your back to your side while in a flat bed without using bedrails?: Total Help needed moving from lying on your back to  sitting on the side of a flat bed without using bedrails?: Total Help needed moving to and from a bed to a chair (including a wheelchair)?: Total Help needed standing up from a chair using your arms (e.g., wheelchair or bedside chair)?: Total Help needed to walk in hospital room?: Total Help needed climbing 3-5 steps with a railing? : Total 6 Click Score: 6    End of Session   Activity Tolerance: Patient limited by fatigue;Patient limited by lethargy Patient left: in bed;with call bell/phone within reach;with bed alarm set Nurse Communication: Mobility status PT Visit Diagnosis: Muscle weakness (generalized) (M62.81);Other abnormalities of gait and mobility (R26.89)     Time: [id number] PT Time Calculation (min) (ACUTE ONLY): 8 min  Charges:  $Therapeutic Activity: 8-22 mins                     Johnny Rice, PT, GCS 01/12/23,3:47 PM

## 2023-01-12 NOTE — Progress Notes (Signed)
PROGRESS NOTE    Johnny Rice  [id number] DOB: August 08, 1946 DOA: 01/05/2023 PCP: [facility]   Assessment & Plan:   Principal Problem:   Severe sepsis ([place]) Active Problems:   Complicated UTI (urinary tract infection)   Obstructive uropathy   Acute metabolic encephalopathy   AKI (acute kidney injury) ([place])   History of stroke   BPH (benign prostatic hyperplasia)   Essential hypertension   Hypothyroid   Tongue laceration   Kidney stone   Sinus bradycardia   Chest discomfort   Bacteremia due to Enterococcus  Assessment and Plan: Severe sepsis: present on admission. Met criteria w/ fever, tachycardia, leukocytosis, AKI, & bacteremia. Blood cxs growing enterococcus. Continue on IV amp as per ID. Urine culture shows no growth    Bacteremia: blood cultures growing enterococcus. Likely secondary to urinary source even though urine cx is neg. ? if abxs were given prior to urine cx was collected. Repeat blood cxs NGTD. Intermittent fevers. Continue on IV amp as per ID   Acute metabolic encephalopathy: mental status is poor today. Very lethargic & not answering questions, repeat CT head ordered   Obstructive uropathy: CT scan showing a 7 mm obstructing stone at the left UVJ with hydronephrosis and perinephric stranding. S/p left ureteral stent placed by Dr. Erlene Quan on 3/18. Intermittent fevers. Repeat CT abd/pelvis shows the degree of hydronephrosis has improved significantly. Continue on IV amp as per ID   Dementia: continue w/ supportive care   AKI: resolved    Hx of CVA: continue on aspirin, statin     HTN: continue on ACE-I. Holding BB secondary to bradycardia  BPH: continue on uroxatral    Hypothyroidism: continue on liothyronine   Chest discomfort: likely not cardiac. Troponin neg x 2.    Sinus bradycardia: continue to hold BB    Tongue laceration: continue on viscous lidocaine         DVT prophylaxis: lovenox  Code Status: full  Family  Communication: discussed pt's care w/ pt's daughter, [name], and answered her questions. Discussed code status as well but Myra to think things over and talk to her father as well before making a decision  Disposition Plan:  possibly d/c to SNF   Level of care: Med-Surg  Status is: Inpatient: very lethargic, fever again today. Repeat CT head ordered     Consultants:  ID Urology   Procedures:   Antimicrobials: IV ampicillin    Subjective: Pt is very lethargic & did not answer any of my questions   Objective: Vitals:   01/11/23 1549 01/11/23 1952 01/12/23 0348 01/12/23 0500  BP: 121/72 (!) 145/119 (!) 157/85   Pulse: 68 80 94   Resp: 18 18 18    Temp: 98.8 F (37.1 C) 98.6 F (37 C) 99.5 F (37.5 C)   TempSrc: Oral Oral Oral   SpO2: 97% 96% 96%   Weight:    64.5 kg  Height:        Intake/Output Summary (Last 24 hours) at 01/12/2023 0823 Last data filed at 01/12/2023 0520 Gross per 24 hour  Intake 180 ml  Output 1500 ml  Net -1320 ml   Filed Weights   01/10/23 0409 01/11/23 0425 01/12/23 0500  Weight: 82.4 kg 64.8 kg 64.5 kg    Examination:  General exam: Appears lethargic  Respiratory system: decreased breath sounds b/l  Cardiovascular system: S1/S2+. No rubs or gallops  Gastrointestinal system: abd is soft, NT, ND & hypoactive bowel sounds  Central nervous system: Moves all extremities  Psychiatry: judgement and insight appears poor     Data Reviewed: I have personally reviewed following labs and imaging studies  CBC: Recent Labs  Lab 01/05/23 0912 01/06/23 0601 01/08/23 0528 01/09/23 0339 01/10/23 0516 01/11/23 0615 01/12/23 0557  WBC 18.7*   < > 11.8* 14.6* 13.0* 10.3 10.8*  NEUTROABS 16.6*  --   --   --   --   --   --   HGB 12.8*   < > 11.6* 11.1* 11.1* 10.6* 10.5*  HCT 37.0*   < > 34.6* 32.6* 33.1* 31.5* 31.5*  MCV 85.1   < > 87.2 85.8 87.1 85.8 86.8  PLT 248   < > 264 260 282 338 372   < > = values in this interval not displayed.   Basic  Metabolic Panel: Recent Labs  Lab 01/05/23 0930 01/05/23 2227 01/08/23 0528 01/09/23 0339 01/10/23 0516 01/11/23 0615 01/12/23 0557  NA  --    < > 141 142 140 138 138  K  --    < > 3.6 3.6 3.5 3.4* 3.7  CL  --    < > 109 104 105 109 101  CO2  --    < > 23 24 24 25 24   GLUCOSE  --    < > 89 108* 110* 112* 107*  BUN  --    < > 20 20 21 16 15   CREATININE  --    < > 0.93 1.13 1.03 0.88 0.86  CALCIUM  --    < > 8.5* 8.6* 8.5* 8.2* 8.6*  MG 1.5*  --   --   --   --   --  1.6*   < > = values in this interval not displayed.   GFR: Estimated Creatinine Clearance: 68.8 mL/min (by C-G formula based on SCr of 0.86 mg/dL). Liver Function Tests: Recent Labs  Lab 01/05/23 0912 01/06/23 0601  AST 25 18  ALT 13 11  ALKPHOS 162* 125  BILITOT 2.7* 1.0  PROT 7.3 6.4*  ALBUMIN 3.7 2.9*   No results for input(s): "LIPASE", "AMYLASE" in the last 168 hours. No results for input(s): "AMMONIA" in the last 168 hours. Coagulation Profile: Recent Labs  Lab 01/05/23 0912  INR 1.3*   Cardiac Enzymes: No results for input(s): "CKTOTAL", "CKMB", "CKMBINDEX", "TROPONINI" in the last 168 hours. BNP (last 3 results) No results for input(s): "PROBNP" in the last 8760 hours. HbA1C: No results for input(s): "HGBA1C" in the last 72 hours. CBG: No results for input(s): "GLUCAP" in the last 168 hours. Lipid Profile: No results for input(s): "CHOL", "HDL", "LDLCALC", "TRIG", "CHOLHDL", "LDLDIRECT" in the last 72 hours. Thyroid Function Tests: No results for input(s): "TSH", "T4TOTAL", "FREET4", "T3FREE", "THYROIDAB" in the last 72 hours. Anemia Panel: No results for input(s): "VITAMINB12", "FOLATE", "FERRITIN", "TIBC", "IRON", "RETICCTPCT" in the last 72 hours. Sepsis Labs: Recent Labs  Lab 01/05/23 0912 01/05/23 1125  LATICACIDVEN 1.8 1.1    Recent Results (from the past 240 hour(s))  Resp panel by RT-PCR (RSV, Flu A&B, Covid) Anterior Nasal Swab     Status: None   Collection Time: 01/05/23   9:10 AM   Specimen: Anterior Nasal Swab  Result Value Ref Range Status   SARS Coronavirus 2 by RT PCR NEGATIVE NEGATIVE Final    Comment: (NOTE) SARS-CoV-2 target nucleic acids are NOT DETECTED.  The SARS-CoV-2 RNA is generally detectable in upper respiratory specimens during the acute phase of infection. The lowest concentration of SARS-CoV-2 viral copies this  assay can detect is 138 copies/mL. A negative result does not preclude SARS-Cov-2 infection and should not be used as the sole basis for treatment or other patient management decisions. A negative result may occur with  improper specimen collection/handling, submission of specimen other than nasopharyngeal swab, presence of viral mutation(s) within the areas targeted by this assay, and inadequate number of viral copies(<138 copies/mL). A negative result must be combined with clinical observations, patient history, and epidemiological information. The expected result is Negative.  Fact Sheet for Patients:  EntrepreneurPulse.com.au  Fact Sheet for Healthcare Providers:  IncredibleEmployment.be  This test is no t yet approved or cleared by the [place] FDA and  has been authorized for detection and/or diagnosis of SARS-CoV-2 by FDA under an Emergency Use Authorization (EUA). This EUA will remain  in effect (meaning this test can be used) for the duration of the COVID-19 declaration under Section 564(b)(1) of the Act, 21 U.S.C.section 360bbb-3(b)(1), unless the authorization is terminated  or revoked sooner.       Influenza A by PCR NEGATIVE NEGATIVE Final   Influenza B by PCR NEGATIVE NEGATIVE Final    Comment: (NOTE) The Xpert Xpress SARS-CoV-2/FLU/RSV plus assay is intended as an aid in the diagnosis of influenza from Nasopharyngeal swab specimens and should not be used as a sole basis for treatment. Nasal washings and aspirates are unacceptable for Xpert Xpress  SARS-CoV-2/FLU/RSV testing.  Fact Sheet for Patients: EntrepreneurPulse.com.au  Fact Sheet for Healthcare Providers: IncredibleEmployment.be  This test is not yet approved or cleared by the [place] FDA and has been authorized for detection and/or diagnosis of SARS-CoV-2 by FDA under an Emergency Use Authorization (EUA). This EUA will remain in effect (meaning this test can be used) for the duration of the COVID-19 declaration under Section 564(b)(1) of the Act, 21 U.S.C. section 360bbb-3(b)(1), unless the authorization is terminated or revoked.     Resp Syncytial Virus by PCR NEGATIVE NEGATIVE Final    Comment: (NOTE) Fact Sheet for Patients: EntrepreneurPulse.com.au  Fact Sheet for Healthcare Providers: IncredibleEmployment.be  This test is not yet approved or cleared by the [place] FDA and has been authorized for detection and/or diagnosis of SARS-CoV-2 by FDA under an Emergency Use Authorization (EUA). This EUA will remain in effect (meaning this test can be used) for the duration of the COVID-19 declaration under Section 564(b)(1) of the Act, 21 U.S.C. section 360bbb-3(b)(1), unless the authorization is terminated or revoked.  Performed at [facility], [address] [place], [place] [address]   Blood Culture (routine x 2)     Status: Abnormal   Collection Time: 01/05/23  9:30 AM   Specimen: BLOOD  Result Value Ref Range Status   Specimen Description   Final    BLOOD LEFT ANTECUBITAL Performed at [facility], [address] [place], [place] [address]    Special Requests   Final    BOTTLES DRAWN AEROBIC AND ANAEROBIC Blood Culture results may not be optimal due to an excessive volume of blood received in culture bottles Performed at [facility], [address], [place], [place] [address]    Culture  Setup Time   Final    GRAM POSITIVE COCCI AEROBIC  BOTTLE ONLY CRITICAL VALUE NOTED.  VALUE IS CONSISTENT WITH PREVIOUSLY REPORTED AND CALLED VALUE. Performed at [facility], [address] [place], [place] [address]    Culture (A)  Final    ENTEROCOCCUS FAECALIS SUSCEPTIBILITIES PERFORMED ON PREVIOUS CULTURE WITHIN THE LAST 5  DAYS. Performed at [facility], [place] [address], [place], [place] [address]    Report Status 01/08/2023 FINAL  Final  Blood Culture (routine x 2)     Status: Abnormal   Collection Time: 01/05/23  9:30 AM   Specimen: BLOOD  Result Value Ref Range Status   Specimen Description   Final    BLOOD RIGHT ANTECUBITAL Performed at [facility], [address] [place], [place] [address]    Special Requests   Final    BOTTLES DRAWN AEROBIC AND ANAEROBIC Blood Culture results may not be optimal due to an excessive volume of blood received in culture bottles Performed at [facility], [address] [place], [place] [address]    Culture  Setup Time   Final    GRAM POSITIVE COCCI IN BOTH AEROBIC AND ANAEROBIC BOTTLES Organism ID to follow CRITICAL RESULT CALLED TO, READ BACK BY AND VERIFIED WITH: NATHAN BLUE@0529  01/06/23 RH Performed at [facility], [address] [place],  [address]    Culture ENTEROCOCCUS FAECALIS (A)  Final   Report Status 01/08/2023 FINAL  Final   Organism ID, Bacteria ENTEROCOCCUS FAECALIS  Final      Susceptibility   Enterococcus faecalis - MIC*    AMPICILLIN <=2 SENSITIVE Sensitive     VANCOMYCIN 1 SENSITIVE Sensitive     GENTAMICIN SYNERGY SENSITIVE Sensitive     * ENTEROCOCCUS FAECALIS  Blood Culture ID Panel (Reflexed)     Status: Abnormal   Collection Time: 01/05/23  9:30 AM  Result Value Ref Range Status   Enterococcus faecalis DETECTED (A) NOT DETECTED Final    Comment: CRITICAL RESULT CALLED TO, READ BACK BY AND VERIFIED WITH: NATHAN BLUE@0529  01/06/23 RH    Enterococcus Faecium NOT DETECTED NOT DETECTED Final   Listeria  monocytogenes NOT DETECTED NOT DETECTED Final   Staphylococcus species NOT DETECTED NOT DETECTED Final   Staphylococcus aureus (BCID) NOT DETECTED NOT DETECTED Final   Staphylococcus epidermidis NOT DETECTED NOT DETECTED Final   Staphylococcus lugdunensis NOT DETECTED NOT DETECTED Final   Streptococcus species NOT DETECTED NOT DETECTED Final   Streptococcus agalactiae NOT DETECTED NOT DETECTED Final   Streptococcus pneumoniae NOT DETECTED NOT DETECTED Final   Streptococcus pyogenes NOT DETECTED NOT DETECTED Final   A.calcoaceticus-baumannii NOT DETECTED NOT DETECTED Final   Bacteroides fragilis NOT DETECTED NOT DETECTED Final   Enterobacterales NOT DETECTED NOT DETECTED Final   Enterobacter cloacae complex NOT DETECTED NOT DETECTED Final   Escherichia coli NOT DETECTED NOT DETECTED Final   Klebsiella aerogenes NOT DETECTED NOT DETECTED Final   Klebsiella oxytoca NOT DETECTED NOT DETECTED Final   Klebsiella pneumoniae NOT DETECTED NOT DETECTED Final   Proteus species NOT DETECTED NOT DETECTED Final   Salmonella species NOT DETECTED NOT DETECTED Final   Serratia marcescens NOT DETECTED NOT DETECTED Final   Haemophilus influenzae NOT DETECTED NOT DETECTED Final   Neisseria meningitidis NOT DETECTED NOT DETECTED Final   Pseudomonas aeruginosa NOT DETECTED NOT DETECTED Final   Stenotrophomonas maltophilia NOT DETECTED NOT DETECTED Final   Candida albicans NOT DETECTED NOT DETECTED Final   Candida auris NOT DETECTED NOT DETECTED Final   Candida glabrata NOT DETECTED NOT DETECTED Final   Candida krusei NOT DETECTED NOT DETECTED Final   Candida parapsilosis NOT DETECTED NOT DETECTED Final   Candida tropicalis NOT DETECTED NOT DETECTED Final   Cryptococcus neoformans/gattii NOT DETECTED NOT DETECTED Final   Vancomycin resistance NOT DETECTED NOT DETECTED Final    Comment:  Performed at [facility], [address], [place], [place] [address]  Urine Culture     Status: None    Collection Time: 01/05/23  1:59 PM   Specimen: Urine, Random  Result Value Ref Range Status   Specimen Description   Final    URINE, RANDOM Performed at [facility], [address] [place], [place] [address]    Special Requests LEFT URETER  Final   Culture   Final    NO GROWTH Performed at [facility], [place] [address] [place], [place] [address]    Report Status 01/06/2023 FINAL  Final  Culture, blood (Routine X 2) w Reflex to ID Panel     Status: None   Collection Time: 01/07/23  2:45 PM   Specimen: BLOOD  Result Value Ref Range Status   Specimen Description BLOOD BLOOD RIGHT HAND  Final   Special Requests   Final    BOTTLES DRAWN AEROBIC ONLY Blood Culture results may not be optimal due to an inadequate volume of blood received in culture bottles   Culture   Final    NO GROWTH 5 DAYS Performed at [facility], [address] [place], [place] [address]    Report Status 01/12/2023 FINAL  Final  Culture, blood (Routine X 2) w Reflex to ID Panel     Status: None   Collection Time: 01/07/23  2:46 PM   Specimen: BLOOD LEFT HAND  Result Value Ref Range Status   Specimen Description BLOOD LEFT HAND BLOOD LEFT HAND  Final   Special Requests   Final    BOTTLES DRAWN AEROBIC ONLY Blood Culture results may not be optimal due to an inadequate volume of blood received in culture bottles   Culture   Final    NO GROWTH 5 DAYS Performed at [facility], [address] [place], [place] [address]    Report Status 01/12/2023 FINAL  Final         Radiology Studies: CT ABDOMEN PELVIS WO CONTRAST  Result Date: 01/11/2023 CLINICAL DATA:  Follow-up hydronephrosis EXAM: CT ABDOMEN AND PELVIS WITHOUT CONTRAST TECHNIQUE: Multidetector CT imaging of the abdomen and pelvis was performed following the standard protocol without IV contrast. RADIATION DOSE REDUCTION: This exam was performed according to the departmental dose-optimization program which includes  automated exposure control, adjustment of the mA and/or kV according to patient size and/or use of iterative reconstruction technique. COMPARISON:  01/05/23 FINDINGS: Lower chest: Minimal right basilar atelectasis is noted. Hepatobiliary: No focal liver abnormality is seen. No gallstones, gallbladder wall thickening, or biliary dilatation. Pancreas: Unremarkable. No pancreatic ductal dilatation or surrounding inflammatory changes. Spleen: Normal in size without focal abnormality. Adrenals/Urinary Tract: Adrenal glands are within normal limits bilaterally. Right kidney demonstrates a punctate nonobstructing renal stone better visualized than on the prior exam. Left kidney demonstrates slight reduction in hydronephrosis secondary to the interval stent placement. The left UPJ stone is again identified within the renal pelvis. The more distal ureter is within normal limits. The bladder is well distended. Stomach/Bowel: Diverticular change of the colon is noted without evidence of diverticulitis. Appendix has been surgically removed. No obstructive changes of the colon are seen. Small bowel and stomach are within normal limits. Vascular/Lymphatic: Aortic atherosclerosis. No enlarged abdominal or pelvic lymph nodes. Reproductive: Prostate is unremarkable. Other: No abdominal wall hernia or abnormality. No abdominopelvic ascites. Musculoskeletal: No acute bony abnormality is noted. IMPRESSION: Previously seen left UPJ stone is been dislodged into the left renal pelvis  with ureteral stent placement. The degree of hydronephrosis has improved significantly from the prior exam consistent with the stone repositioning and stent placement. Mild right basilar atelectasis is again noted. 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 Electronically Signed   By: Inez Catalina M.D.   On: 01/11/2023 22:44   ECHOCARDIOGRAM COMPLETE  Result Date: 01/10/2023    ECHOCARDIOGRAM REPORT   Patient Name:   DEUNDRA MEHLE Date of Exam: 01/10/2023 Medical  Rec #:  [id number]   Height:       67.0 in Accession #:    [id number]  Weight:       181.7 lb Date of Birth:  February 16, 1946  BSA:          1.941 m Patient Age:    28 years    BP:           151/64 mmHg Patient Gender: M           HR:           63 bpm. Exam Location:  [facility] Procedure: 2D Echo, Color Doppler and Cardiac Doppler Indications:     Bacteremia R78.81  History:         Patient has no prior history of Echocardiogram examinations.                  Stroke; Risk Factors:Hypertension.  Sonographer:     L. Thornton-Maynard Referring Phys:  [id number] Tsosie Billing Diagnosing Phys: Ida Rogue MD IMPRESSIONS  1. Left ventricular ejection fraction, by estimation, is 55 to 60%. The left ventricle has normal function. The left ventricle has no regional wall motion abnormalities. There is moderate asymmetric left ventricular hypertrophy of the basal-septal segment. Left ventricular diastolic parameters are consistent with Grade I diastolic dysfunction (impaired relaxation).  2. Right ventricular systolic function is normal. The right ventricular size is normal. There is normal pulmonary artery systolic pressure.  3. The mitral valve is normal in structure. Mild mitral valve regurgitation. No evidence of mitral stenosis.  4. The aortic valve is normal in structure. Aortic valve regurgitation is mild. No aortic stenosis is present.  5. The inferior vena cava is normal in size with greater than 50% respiratory variability, suggesting right atrial pressure of 3 mmHg.  6. No valve vegetation noted. FINDINGS  Left Ventricle: Left ventricular ejection fraction, by estimation, is 55 to 60%. The left ventricle has normal function. The left ventricle has no regional wall motion abnormalities. The left ventricular internal cavity size was normal in size. There is  moderate asymmetric left ventricular hypertrophy of the basal-septal segment. Left ventricular diastolic parameters are consistent with Grade I diastolic  dysfunction (impaired relaxation). Right Ventricle: The right ventricular size is normal. No increase in right ventricular wall thickness. Right ventricular systolic function is normal. There is normal pulmonary artery systolic pressure. The tricuspid regurgitant velocity is 2.15 m/s, and  with an assumed right atrial pressure of 3 mmHg, the estimated right ventricular systolic pressure is [id number] mmHg. Left Atrium: Left atrial size was normal in size. Right Atrium: Right atrial size was normal in size. Pericardium: There is no evidence of pericardial effusion. Mitral Valve: The mitral valve is normal in structure. Mild mitral valve regurgitation. No evidence of mitral valve stenosis. There is no evidence of mitral valve vegetation. Tricuspid Valve: The tricuspid valve is normal in structure. Tricuspid valve regurgitation is not demonstrated. No evidence of tricuspid stenosis. There is no evidence of tricuspid valve vegetation. Aortic Valve: The aortic valve is normal in structure. Aortic valve regurgitation  is mild. Aortic regurgitation PHT measures 1183 msec. No aortic stenosis is present. Aortic valve mean gradient measures 3.5 mmHg. Aortic valve peak gradient measures 6.4 mmHg. Aortic valve area, by VTI measures 1.57 cm. There is no evidence of aortic valve vegetation. Pulmonic Valve: The pulmonic valve was normal in structure. Pulmonic valve regurgitation is not visualized. No evidence of pulmonic stenosis. There is no evidence of pulmonic valve vegetation. Aorta: The aortic root is normal in size and structure. Venous: The inferior vena cava is normal in size with greater than 50% respiratory variability, suggesting right atrial pressure of 3 mmHg. IAS/Shunts: No atrial level shunt detected by color flow Doppler.  LEFT VENTRICLE PLAX 2D LVIDd:         3.80 cm     Diastology LVIDs:         2.40 cm     LV e' medial:    5.00 cm/s LV PW:         0.90 cm     LV E/e' medial:  13.3 LV IVS:        1.60 cm     LV e'  lateral:   7.18 cm/s LVOT diam:     1.90 cm     LV E/e' lateral: 9.3 LV SV:         44 LV SV Index:   23 LVOT Area:     2.84 cm  LV Volumes (MOD) LV vol d, MOD A2C: 77.1 ml LV vol d, MOD A4C: 74.6 ml LV vol s, MOD A2C: 26.2 ml LV vol s, MOD A4C: 18.9 ml LV SV MOD A2C:     50.9 ml LV SV MOD A4C:     74.6 ml LV SV MOD BP:      54.9 ml RIGHT VENTRICLE             IVC RV S prime:     21.10 cm/s  IVC diam: 2.00 cm TAPSE (M-mode): 3.4 cm LEFT ATRIUM             Index LA diam:        2.40 cm 1.24 cm/m LA Vol (A2C):   43.8 ml 22.56 ml/m LA Vol (A4C):   38.8 ml 19.99 ml/m LA Biplane Vol: 45.3 ml 23.33 ml/m  AORTIC VALVE                    PULMONIC VALVE AV Area (Vmax):    1.90 cm     PV Vmax:       1.07 m/s AV Area (Vmean):   1.77 cm     PV Peak grad:  4.6 mmHg AV Area (VTI):     1.57 cm AV Vmax:           126.50 cm/s AV Vmean:          86.500 cm/s AV VTI:            0.280 m AV Peak Grad:      6.4 mmHg AV Mean Grad:      3.5 mmHg LVOT Vmax:         84.90 cm/s LVOT Vmean:        54.000 cm/s LVOT VTI:          0.155 m LVOT/AV VTI ratio: 0.55 AI PHT:            1183 msec  AORTA Ao Root diam: 3.60 cm Ao Asc diam:  3.30 cm MITRAL VALVE  TRICUSPID VALVE MV Area (PHT): 3.68 cm     TR Peak grad:   18.5 mmHg MV Decel Time: 206 msec     TR Vmax:        215.00 cm/s MV E velocity: 66.60 cm/s MV A velocity: 113.00 cm/s  SHUNTS MV E/A ratio:  0.59         Systemic VTI:  0.16 m                             Systemic Diam: 1.90 cm Ida Rogue MD Electronically signed by Ida Rogue MD Signature Date/Time: 01/10/2023/2:19:31 PM    Final         Scheduled Meds:  alfuzosin  10 mg Oral q AM   ARIPiprazole  15 mg Oral Daily   aspirin  81 mg Oral Daily   atorvastatin  20 mg Oral QHS   clonazePAM  1 mg Oral TID   enoxaparin (LOVENOX) injection  40 mg Subcutaneous Q24H   feeding supplement  237 mL Oral TID BM   liothyronine  25 mcg Oral Daily   lisinopril  40 mg Oral Daily   multivitamin with minerals  1  tablet Oral Daily   polyethylene glycol  17 g Oral QODAY   traZODone  50 mg Oral QHS   Continuous Infusions:  ampicillin (OMNIPEN) IV 2 g (01/12/23 0403)   magnesium sulfate bolus IVPB       LOS: 7 days    Time spent: 30 mins    Wyvonnia Dusky, MD [facility] Pager [phone]  If 7PM-7AM, please contact night-coverage [email or web address] 01/12/2023, 8:23 AM

## 2023-01-13 DIAGNOSIS — N390 Urinary tract infection, site not specified: Secondary | ICD-10-CM | POA: Diagnosis not present

## 2023-01-13 DIAGNOSIS — A419 Sepsis, unspecified organism: Secondary | ICD-10-CM | POA: Diagnosis not present

## 2023-01-13 DIAGNOSIS — R7881 Bacteremia: Secondary | ICD-10-CM | POA: Diagnosis not present

## 2023-01-13 DIAGNOSIS — G9341 Metabolic encephalopathy: Secondary | ICD-10-CM | POA: Diagnosis not present

## 2023-01-13 DIAGNOSIS — N139 Obstructive and reflux uropathy, unspecified: Secondary | ICD-10-CM | POA: Diagnosis not present

## 2023-01-13 DIAGNOSIS — I809 Phlebitis and thrombophlebitis of unspecified site: Secondary | ICD-10-CM

## 2023-01-13 DIAGNOSIS — R6520 Severe sepsis without septic shock: Secondary | ICD-10-CM | POA: Diagnosis not present

## 2023-01-13 LAB — BASIC METABOLIC PANEL
Anion gap: 7 (ref 5–15)
BUN: 18 mg/dL (ref 8–23)
CO2: 24 mmol/L (ref 22–32)
Calcium: 8.3 mg/dL — ABNORMAL LOW (ref 8.9–10.3)
Chloride: 104 mmol/L (ref 98–111)
Creatinine, Ser: 0.96 mg/dL (ref 0.61–1.24)
GFR, Estimated: >60 mL/min (ref >60)
Glucose, Bld: 118 mg/dL — ABNORMAL HIGH (ref 70–99)
Potassium: 3.8 mmol/L (ref 3.5–5.1)
Sodium: 135 mmol/L (ref 135–145)

## 2023-01-13 LAB — CBC
HCT: 31.9 % — ABNORMAL LOW (ref 39.0–52.0)
Hemoglobin: 10.7 g/dL — ABNORMAL LOW (ref 13.0–17.0)
MCH: 28.7 pg (ref 26.0–34.0)
MCHC: 33.5 g/dL (ref 30.0–36.0)
MCV: 85.5 fL (ref 80.0–100.0)
Platelets: 410 10*3/uL — ABNORMAL HIGH (ref 150–400)
RBC: 3.73 MIL/uL — ABNORMAL LOW (ref 4.22–5.81)
RDW: 14.7 % (ref 11.5–15.5)
WBC: 13.3 10*3/uL — ABNORMAL HIGH (ref 4.0–10.5)
nRBC: 0 % (ref 0.0–0.2)

## 2023-01-13 NOTE — Progress Notes (Signed)
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    PT Comments    Patient received slumped over to his right side in bed. States "help me" and then states "hurts" but cannot elaborate. Patient assisted in repositioning in bed to more midline orientation with total assist. Unable to follow instruction for exercises on right side, performed passive rom/stretching of L LE and UE. Patient will continue to benefit from skilled PT as able- poor ability at this time.       Recommendations for follow up therapy are one component of a multi-disciplinary discharge planning process, led by the attending physician.  Recommendations may be updated based on patient status, additional functional criteria and insurance authorization.  Follow Up Recommendations  Can patient physically be transported by private vehicle: No    Assistance Recommended at Discharge Frequent or constant Supervision/Assistance  Patient can return home with the following Two people to help with walking and/or transfers;Two people to help with bathing/dressing/bathroom   Equipment Recommendations  None recommended by PT    Recommendations for Other Services       Precautions / Restrictions Precautions Precautions: Fall Restrictions Weight Bearing Restrictions: No     Mobility  Bed Mobility Overal bed mobility: Needs Assistance Bed Mobility: Rolling Rolling: Total assist         General bed mobility comments: Total assist needed to reposition in bed. Patient received slumped over to his right side lying in bed. Assisted patient in more neutral position.    Transfers                   General transfer comment: did not attempt due to limited mobility in bed    Ambulation/Gait                General Gait Details: unsafe/unable   Stairs             Wheelchair Mobility    Modified Rankin (Stroke Patients Only)       Balance Overall balance assessment: Needs assistance Sitting-balance support: Feet unsupported Sitting balance-Leahy Scale: Poor Sitting balance - Comments: required external support to maintain sitting balance.                                    Cognition Arousal/Alertness: Lethargic Behavior During Therapy: Flat affect Overall Cognitive Status: Difficult to assess                                 General Comments: "help me" stated when arrived in room.        Exercises Total Joint Exercises Ankle Circles/Pumps: PROM, Both, 10 reps Heel Slides: PROM, Left, 10 reps Hip ABduction/ADduction: PROM, Left, 5 reps Straight Leg Raises: PROM, Left, 5 reps    General Comments        Pertinent Vitals/Pain Pain Assessment Breathing: normal Negative Vocalization: occasional moan/groan, low speech, negative/disapproving quality Facial Expression: smiling or inexpressive Body Language: relaxed Consolability: no need to console PAINAD Score: 1 Pain Location: Patient states "hurt" but unable to elaborate Pain Intervention(s): Repositioned    Home Living  Prior Function            PT Goals (current goals can now be found in the care plan section) Acute Rehab PT Goals Patient Stated Goal: patient unable to state PT Goal Formulation: Patient unable to participate in goal setting Time For Goal Achievement: 01/20/23 Progress towards PT goals: Not progressing toward goals - comment (patient lethargic)    Frequency    Min 2X/week      PT Plan Current plan remains appropriate    Co-evaluation              AM-PAC PT "6 Clicks" Mobility   Outcome Measure  Help needed turning from your back to your side while in a flat bed without using bedrails?:  Total Help needed moving from lying on your back to sitting on the side of a flat bed without using bedrails?: Total Help needed moving to and from a bed to a chair (including a wheelchair)?: Total Help needed standing up from a chair using your arms (e.g., wheelchair or bedside chair)?: Total Help needed to walk in hospital room?: Total Help needed climbing 3-5 steps with a railing? : Total 6 Click Score: 6    End of Session   Activity Tolerance: Patient limited by fatigue;Patient limited by lethargy Patient left: in bed;with call bell/phone within reach;with bed alarm set Nurse Communication: Mobility status PT Visit Diagnosis: Muscle weakness (generalized) (M62.81);Other abnormalities of gait and mobility (R26.89)     Time: 1340-1350 PT Time Calculation (min) (ACUTE ONLY): 10 min  Charges:  $Therapeutic Exercise: 8-22 mins                     Braxley Balandran, PT, GCS 01/13/23,2:03 PM

## 2023-01-13 NOTE — Plan of Care (Signed)
Pt alert to self. 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 Pt takes meds whole. Primafit in place to collect urine due to incontinent of bowel and bladder. Pt will wake up periodically but drifts back off to sleep. Klonopin and trazodone held due to excessive drowsiness. Spoke to charge nurse regarding pt behavior and meds. See previous note. Pt afebrile this shift. Vitals stable.  Problem: Education: Goal: Knowledge of General Education information will improve Description: Including pain rating scale, medication(s)/side effects and non-pharmacologic comfort measures Outcome: Progressing   Problem: Health Behavior/Discharge Planning: Goal: Ability to manage health-related needs will improve Outcome: Progressing   Problem: Clinical Measurements: Goal: Ability to maintain clinical measurements within normal limits will improve Outcome: Progressing Goal: Will remain free from infection Outcome: Progressing Goal: Diagnostic test results will improve Outcome: Progressing Goal: Respiratory complications will improve Outcome: Progressing Goal: Cardiovascular complication will be avoided Outcome: Progressing   Problem: Activity: Goal: Risk for activity intolerance will decrease Outcome: Progressing   Problem: Coping: Goal: Level of anxiety will decrease Outcome: Progressing   Problem: Elimination: Goal: Will not experience complications related to bowel motility Outcome: Progressing Goal: Will not experience complications related to urinary retention Outcome: Progressing   Problem: Pain Managment: Goal: General experience of comfort will improve Outcome: Progressing   Problem: Safety: Goal: Ability to remain free from injury will improve Outcome: Progressing   Problem: Skin Integrity: Goal: Risk for impaired skin integrity will decrease Outcome: Progressing

## 2023-01-13 NOTE — Progress Notes (Signed)
PROGRESS NOTE   HPI was taken from Dr. Ernestina Rice: Johnny Rice is a 77 y.o. male with medical history significant of hypertension, CVA, hyperlipidemia, hypothyroidism, BPH, cognitive impairment presenting with sepsis, obstructive uropathy, encephalopathy.  Limited history as the patient is minimally verbal at present/sleeping.  Is not responding to questioning at present.  Per report, patient with intractable nausea and vomiting at skilled nursing facility.  Was noted to have a temperature 104 while at facility.  No reports of abdominal pain diarrhea.  No reports of chest pain or cough.  No reports of recent falls.  Was unable to take his medications in the morning. Presented to the ER Tmax of 104, heart rate 104, blood pressure 140s over 70s.  Satting well on room air though transition to 2 L nasal cannula to keep O2 sats greater than 98%.  White count 18.7, hemoglobin 12.8, creatinine 1.64.  Potassium 3.3.  T. bili 2.7.  Chest x-ray, CT head grossly stable.  CT abdomen pelvis showed 7 mm obstructing stone at the left UPJ with upstream hydronephrosis and perinephric stranding with concern for superimposed infection.  Dr. Erlene Rice with urology consulted with plan for operative placement of ureteral stent.   As per Dr. Leslye Rice: 3/19. Blood cultures positive for Enterococcus. Antibiotics changed over to Unasyn.   As per Dr. Jimmye Rice 3/20-3/26/24: Pt was found to have bacteremia likely secondary a UTI but urine cx did not grow anything. ? If abxs were given prior to urine cx being collected. Pt is on IV amp as per ID. Pt continues to have intermittent fevers of unknown etiology. Repeat CT abd/pelvis showed the degree of hydronephrosis has significantly reduced. Repeat influenza, COVID19 were both neg. Will need TEE if fever persist as per ID. Also, pt's mental status has continued to decline despite treatment for the above & below stated infection. Repeat CT head shows no acute intracranial findings. Discussed  code status w/ pt's daughter, Johnny Rice, but she wanted to think things over first. Poor prognosis    Johnny Rice  [id number] DOB: [birth year] DOA: 01/05/2023 PCP: [facility]   Assessment & Plan:   Principal Problem:   Severe sepsis ([place]) Active Problems:   Complicated UTI (urinary tract infection)   Obstructive uropathy   Acute metabolic encephalopathy   AKI (acute kidney injury) ([place])   History of stroke   BPH (benign prostatic hyperplasia)   Essential hypertension   Hypothyroid   Tongue laceration   Kidney stone   Sinus bradycardia   Chest discomfort   Bacteremia due to Enterococcus  Assessment and Plan: Severe sepsis: present on admission. Met criteria w/ fever, tachycardia, leukocytosis, AKI, & bacteremia. Blood cxs growing enterococcus. Continue on IV amp as per ID. Urine cx shows no growth   Bacteremia: blood cultures growing enterococcus faecalis. Likely secondary to urinary source even though urine cx is neg. ? if abxs were given prior to urine cx being collected. Intermittent fevers. Repeat blood cxs NGTD. Continue on IV amp as per ID   Acute metabolic encephalopathy: mental status has not improved much from day prior. Repeat CT head shows no acute intracranial abnormalities. Etiology unclear, worsening dementia vs infection.  Obstructive uropathy: CT scan showing a 7 mm obstructing stone at the left UVJ with hydronephrosis and perinephric stranding. S/p left ureteral stent placed by Dr. Erlene Rice on 3/18. Intermittent fevers & may need TEE if fever persists as per ID. Continue on IV amp as per ID. Repeat CT abd/pelvis shows the degree of hydronephrosis has improved  significantly.   Dementia: continue w/ supportive care    AKI: resolved    Hx of CVA: continue on aspirin, statin      HTN: continue on ACE-I. Holding BB secondary to bradycardia  BPH: continue on uroxatral   Hypothyroidism: continue on liothyronine   Chest discomfort: likely not cardiac.  Troponin neg x 2.    Sinus bradycardia: continue to hold metoprolol    Tongue laceration: continue on viscous lidocaine         DVT prophylaxis: lovenox  Code Status: full  Family Communication: called pt's daughter, Johnny Rice, no answer so I left a voicemail  Disposition Plan:  possibly d/c to SNF   Level of care: Med-Surg  Status is: Inpatient: still spiking fevers of unknown etiology     Consultants:  ID Urology   Procedures:   Antimicrobials: IV ampicillin    Subjective: Pt is confused & not answering questions appropriately   Objective: Vitals:   01/12/23 1950 01/13/23 0444 01/13/23 0500 01/13/23 0850  BP: 139/89  136/76 (!) 150/90  Pulse: (!) 101  94 84  Resp: 16  17 18   Temp: 98.4 F (36.9 C)  98.4 F (36.9 C) 100 F (37.8 C)  TempSrc: Oral  Oral Oral  SpO2: 99%  99% 97%  Weight:  79.7 kg    Height:        Intake/Output Summary (Last 24 hours) at 01/13/2023 0852 Last data filed at 01/13/2023 0110 Gross per 24 hour  Intake --  Output 950 ml  Net -950 ml   Filed Weights   01/11/23 0425 01/12/23 0500 01/13/23 0444  Weight: 64.8 kg 64.5 kg 79.7 kg    Examination:  General exam: Appears lethargic  Respiratory system: diminished breath sounds b/l  Cardiovascular system: S1 & S2+. No rubs or clicks  Gastrointestinal system: abd is soft, NT, ND & hypoactive bowel sounds Central nervous system: Moves all extremities  Psychiatry: judgement and insight appears poor     Data Reviewed: I have personally reviewed following labs and imaging studies  CBC: Recent Labs  Lab 01/09/23 0339 01/10/23 0516 01/11/23 0615 01/12/23 0557 01/13/23 0523  WBC 14.6* 13.0* 10.3 10.8* 13.3*  HGB 11.1* 11.1* 10.6* 10.5* 10.7*  HCT 32.6* 33.1* 31.5* 31.5* 31.9*  MCV 85.8 87.1 85.8 86.8 85.5  PLT 260 282 338 372 [id number]*   Basic Metabolic Panel: Recent Labs  Lab 01/09/23 0339 01/10/23 0516 01/11/23 0615 01/12/23 0557 01/13/23 0523  NA 142 140 138 138 135  K  3.6 3.5 3.4* 3.7 3.8  CL 104 105 109 101 104  CO2 24 24 25 24 24   GLUCOSE 108* 110* 112* 107* 118*  BUN 20 21 16 15 18   CREATININE 1.13 1.03 0.88 0.86 0.96  CALCIUM 8.6* 8.5* 8.2* 8.6* 8.3*  MG  --   --   --  1.6*  --    GFR: Estimated Creatinine Clearance: 68.3 mL/min (by C-G formula based on SCr of 0.96 mg/dL). Liver Function Tests: No results for input(s): "AST", "ALT", "ALKPHOS", "BILITOT", "PROT", "ALBUMIN" in the last 168 hours.  No results for input(s): "LIPASE", "AMYLASE" in the last 168 hours. No results for input(s): "AMMONIA" in the last 168 hours. Coagulation Profile: No results for input(s): "INR", "PROTIME" in the last 168 hours.  Cardiac Enzymes: No results for input(s): "CKTOTAL", "CKMB", "CKMBINDEX", "TROPONINI" in the last 168 hours. BNP (last 3 results) No results for input(s): "PROBNP" in the last 8760 hours. HbA1C: No results for input(s): "HGBA1C" in  the last 72 hours. CBG: No results for input(s): "GLUCAP" in the last 168 hours. Lipid Profile: No results for input(s): "CHOL", "HDL", "LDLCALC", "TRIG", "CHOLHDL", "LDLDIRECT" in the last 72 hours. Thyroid Function Tests: No results for input(s): "TSH", "T4TOTAL", "FREET4", "T3FREE", "THYROIDAB" in the last 72 hours. Anemia Panel: No results for input(s): "VITAMINB12", "FOLATE", "FERRITIN", "TIBC", "IRON", "RETICCTPCT" in the last 72 hours. Sepsis Labs: No results for input(s): "PROCALCITON", "LATICACIDVEN" in the last 168 hours.   Recent Results (from the past 240 hour(s))  Resp panel by RT-PCR (RSV, Flu A&B, Covid) Anterior Nasal Swab     Status: None   Collection Time: 01/05/23  9:10 AM   Specimen: Anterior Nasal Swab  Result Value Ref Range Status   SARS Coronavirus 2 by RT PCR NEGATIVE NEGATIVE Final    Comment: (NOTE) SARS-CoV-2 target nucleic acids are NOT DETECTED.  The SARS-CoV-2 RNA is generally detectable in upper respiratory specimens during the acute phase of infection. The  lowest concentration of SARS-CoV-2 viral copies this assay can detect is 138 copies/mL. A negative result does not preclude SARS-Cov-2 infection and should not be used as the sole basis for treatment or other patient management decisions. A negative result may occur with  improper specimen collection/handling, submission of specimen other than nasopharyngeal swab, presence of viral mutation(s) within the areas targeted by this assay, and inadequate number of viral copies(<138 copies/mL). A negative result must be combined with clinical observations, patient history, and epidemiological information. The expected result is Negative.  Fact Sheet for Patients:  EntrepreneurPulse.com.au  Fact Sheet for Healthcare Providers:  IncredibleEmployment.be  This test is no t yet approved or cleared by the [place] FDA and  has been authorized for detection and/or diagnosis of SARS-CoV-2 by FDA under an Emergency Use Authorization (EUA). This EUA will remain  in effect (meaning this test can be used) for the duration of the COVID-19 declaration under Section 564(b)(1) of the Act, 21 U.S.C.section 360bbb-3(b)(1), unless the authorization is terminated  or revoked sooner.       Influenza A by PCR NEGATIVE NEGATIVE Final   Influenza B by PCR NEGATIVE NEGATIVE Final    Comment: (NOTE) The Xpert Xpress SARS-CoV-2/FLU/RSV plus assay is intended as an aid in the diagnosis of influenza from Nasopharyngeal swab specimens and should not be used as a sole basis for treatment. Nasal washings and aspirates are unacceptable for Xpert Xpress SARS-CoV-2/FLU/RSV testing.  Fact Sheet for Patients: EntrepreneurPulse.com.au  Fact Sheet for Healthcare Providers: IncredibleEmployment.be  This test is not yet approved or cleared by the [place] FDA and has been authorized for detection and/or diagnosis of SARS-CoV-2 by FDA under  an Emergency Use Authorization (EUA). This EUA will remain in effect (meaning this test can be used) for the duration of the COVID-19 declaration under Section 564(b)(1) of the Act, 21 U.S.C. section 360bbb-3(b)(1), unless the authorization is terminated or revoked.     Resp Syncytial Virus by PCR NEGATIVE NEGATIVE Final    Comment: (NOTE) Fact Sheet for Patients: EntrepreneurPulse.com.au  Fact Sheet for Healthcare Providers: IncredibleEmployment.be  This test is not yet approved or cleared by the [place] FDA and has been authorized for detection and/or diagnosis of SARS-CoV-2 by FDA under an Emergency Use Authorization (EUA). This EUA will remain in effect (meaning this test can be used) for the duration of the COVID-19 declaration under Section 564(b)(1) of the Act, 21 U.S.C. section 360bbb-3(b)(1), unless the authorization is terminated or revoked.  Performed at [facility],  [place], [place] [address]   Blood Culture (routine x 2)     Status: Abnormal   Collection Time: 01/05/23  9:30 AM   Specimen: BLOOD  Result Value Ref Range Status   Specimen Description   Final    BLOOD LEFT ANTECUBITAL Performed at [facility], [address] [place], [place] [address]    Special Requests   Final    BOTTLES DRAWN AEROBIC AND ANAEROBIC Blood Culture results may not be optimal due to an excessive volume of blood received in culture bottles Performed at [facility], [address] [place], [place] [address]    Culture  Setup Time   Final    GRAM POSITIVE COCCI AEROBIC BOTTLE ONLY CRITICAL VALUE NOTED.  VALUE IS CONSISTENT WITH PREVIOUSLY REPORTED AND CALLED VALUE. Performed at [facility], [address], [place], [place] [address]    Culture (A)  Final    ENTEROCOCCUS FAECALIS SUSCEPTIBILITIES PERFORMED ON PREVIOUS CULTURE WITHIN THE LAST 5 DAYS. Performed at [facility], [place] [address], [place], [place] [address]    Report Status 01/08/2023 FINAL  Final  Blood Culture (routine x 2)     Status: Abnormal   Collection Time: 01/05/23  9:30 AM   Specimen: BLOOD  Result Value Ref Range Status   Specimen Description   Final    BLOOD RIGHT ANTECUBITAL Performed at [facility], [address] [place], [place] [address]    Special Requests   Final    BOTTLES DRAWN AEROBIC AND ANAEROBIC Blood Culture results may not be optimal due to an excessive volume of blood received in culture bottles Performed at [facility], [address], [place], [place] [address]    Culture  Setup Time   Final    GRAM POSITIVE COCCI IN BOTH AEROBIC AND ANAEROBIC BOTTLES Organism ID to follow CRITICAL RESULT CALLED TO, READ BACK BY AND VERIFIED WITH: NATHAN BLUE@0529  01/06/23 RH Performed at [facility], [address] [place], [place] [address]    Culture ENTEROCOCCUS FAECALIS (A)  Final   Report Status 01/08/2023 FINAL  Final   Organism ID, Bacteria ENTEROCOCCUS FAECALIS  Final      Susceptibility   Enterococcus faecalis - MIC*    AMPICILLIN <=2 SENSITIVE Sensitive     VANCOMYCIN 1 SENSITIVE Sensitive     GENTAMICIN SYNERGY SENSITIVE Sensitive     * ENTEROCOCCUS FAECALIS  Blood Culture ID Panel (Reflexed)     Status: Abnormal   Collection Time: 01/05/23  9:30 AM  Result Value Ref Range Status   Enterococcus faecalis DETECTED (A) NOT DETECTED Final    Comment: CRITICAL RESULT CALLED TO, READ BACK BY AND VERIFIED WITH: NATHAN BLUE@0529  01/06/23 RH    Enterococcus Faecium NOT DETECTED NOT DETECTED Final   Listeria monocytogenes NOT DETECTED NOT DETECTED Final   Staphylococcus species NOT DETECTED NOT DETECTED Final   Staphylococcus aureus (BCID) NOT DETECTED NOT DETECTED Final   Staphylococcus epidermidis NOT DETECTED NOT DETECTED Final   Staphylococcus lugdunensis NOT DETECTED NOT DETECTED Final   Streptococcus species NOT DETECTED NOT  DETECTED Final   Streptococcus agalactiae NOT DETECTED NOT DETECTED Final   Streptococcus pneumoniae NOT DETECTED NOT DETECTED Final   Streptococcus pyogenes NOT DETECTED NOT DETECTED Final   A.calcoaceticus-baumannii NOT DETECTED NOT DETECTED Final   Bacteroides fragilis NOT DETECTED NOT DETECTED Final   Enterobacterales NOT DETECTED NOT DETECTED Final   Enterobacter cloacae complex NOT DETECTED NOT DETECTED Final  Escherichia coli NOT DETECTED NOT DETECTED Final   Klebsiella aerogenes NOT DETECTED NOT DETECTED Final   Klebsiella oxytoca NOT DETECTED NOT DETECTED Final   Klebsiella pneumoniae NOT DETECTED NOT DETECTED Final   Proteus species NOT DETECTED NOT DETECTED Final   Salmonella species NOT DETECTED NOT DETECTED Final   Serratia marcescens NOT DETECTED NOT DETECTED Final   Haemophilus influenzae NOT DETECTED NOT DETECTED Final   Neisseria meningitidis NOT DETECTED NOT DETECTED Final   Pseudomonas aeruginosa NOT DETECTED NOT DETECTED Final   Stenotrophomonas maltophilia NOT DETECTED NOT DETECTED Final   Candida albicans NOT DETECTED NOT DETECTED Final   Candida auris NOT DETECTED NOT DETECTED Final   Candida glabrata NOT DETECTED NOT DETECTED Final   Candida krusei NOT DETECTED NOT DETECTED Final   Candida parapsilosis NOT DETECTED NOT DETECTED Final   Candida tropicalis NOT DETECTED NOT DETECTED Final   Cryptococcus neoformans/gattii NOT DETECTED NOT DETECTED Final   Vancomycin resistance NOT DETECTED NOT DETECTED Final    Comment: Performed at [facility], [address] [place], [place] [address]  Urine Culture     Status: None   Collection Time: 01/05/23  1:59 PM   Specimen: Urine, Random  Result Value Ref Range Status   Specimen Description   Final    URINE, RANDOM Performed at [facility], [address] [place], [place] [address]    Special Requests LEFT URETER  Final   Culture   Final    NO GROWTH Performed at [facility],  [place] [address], [place], [place] [address]    Report Status 01/06/2023 FINAL  Final  Culture, blood (Routine X 2) w Reflex to ID Panel     Status: None   Collection Time: 01/07/23  2:45 PM   Specimen: BLOOD  Result Value Ref Range Status   Specimen Description BLOOD BLOOD RIGHT HAND  Final   Special Requests   Final    BOTTLES DRAWN AEROBIC ONLY Blood Culture results may not be optimal due to an inadequate volume of blood received in culture bottles   Culture   Final    NO GROWTH 5 DAYS Performed at [facility], [address] [place], [place] [address]    Report Status 01/12/2023 FINAL  Final  Culture, blood (Routine X 2) w Reflex to ID Panel     Status: None   Collection Time: 01/07/23  2:46 PM   Specimen: BLOOD LEFT HAND  Result Value Ref Range Status   Specimen Description BLOOD LEFT HAND BLOOD LEFT HAND  Final   Special Requests   Final    BOTTLES DRAWN AEROBIC ONLY Blood Culture results may not be optimal due to an inadequate volume of blood received in culture bottles   Culture   Final    NO GROWTH 5 DAYS Performed at [facility], [address] [place], [place] [address]    Report Status 01/12/2023 FINAL  Final  SARS Coronavirus 2 by RT PCR (hospital order, performed in [facility] hospital lab) *cepheid single result test* Anterior Nasal Swab     Status: None   Collection Time: 01/12/23  4:15 PM   Specimen: Anterior Nasal Swab  Result Value Ref Range Status   SARS Coronavirus 2 by RT PCR NEGATIVE NEGATIVE Final    Comment: (NOTE) SARS-CoV-2 target nucleic acids are NOT DETECTED.  The SARS-CoV-2 RNA is generally detectable in upper and lower respiratory specimens during the acute phase of infection. The lowest concentration of  SARS-CoV-2 viral copies this assay can detect is 250 copies / mL. A negative result does not preclude SARS-CoV-2 infection and should not be used as the sole basis for treatment or other patient management decisions.  A  negative result may occur with improper specimen collection / handling, submission of specimen other than nasopharyngeal swab, presence of viral mutation(s) within the areas targeted by this assay, and inadequate number of viral copies (<250 copies / mL). A negative result must be combined with clinical observations, patient history, and epidemiological information.  Fact Sheet for Patients:   https://www.patel.info/  Fact Sheet for Healthcare Providers: https://hall.com/  This test is not yet approved or  cleared by the [place] FDA and has been authorized for detection and/or diagnosis of SARS-CoV-2 by FDA under an Emergency Use Authorization (EUA).  This EUA will remain in effect (meaning this test can be used) for the duration of the COVID-19 declaration under Section 564(b)(1) of the Act, 21 U.S.C. section 360bbb-3(b)(1), unless the authorization is terminated or revoked sooner.  Performed at [facility], [address] [place], [place] [address]   Resp Panel by RT-PCR (Flu A&B, Covid)     Status: None   Collection Time: 01/12/23  6:48 PM  Result Value Ref Range Status   SARS Coronavirus 2 by RT PCR NEGATIVE NEGATIVE Final    Comment: (NOTE) SARS-CoV-2 target nucleic acids are NOT DETECTED.  The SARS-CoV-2 RNA is generally detectable in upper respiratory specimens during the acute phase of infection. The lowest concentration of SARS-CoV-2 viral copies this assay can detect is 138 copies/mL. A negative result does not preclude SARS-Cov-2 infection and should not be used as the sole basis for treatment or other patient management decisions. A negative result may occur with  improper specimen collection/handling, submission of specimen other than nasopharyngeal swab, presence of viral mutation(s) within the areas targeted by this assay, and inadequate number of viral copies(<138 copies/mL). A negative result must be  combined with clinical observations, patient history, and epidemiological information. The expected result is Negative.  Fact Sheet for Patients:  EntrepreneurPulse.com.au  Fact Sheet for Healthcare Providers:  IncredibleEmployment.be  This test is no t yet approved or cleared by the [place] FDA and  has been authorized for detection and/or diagnosis of SARS-CoV-2 by FDA under an Emergency Use Authorization (EUA). This EUA will remain  in effect (meaning this test can be used) for the duration of the COVID-19 declaration under Section 564(b)(1) of the Act, 21 U.S.C.section 360bbb-3(b)(1), unless the authorization is terminated  or revoked sooner.       Influenza A by PCR NEGATIVE NEGATIVE Final   Influenza B by PCR NEGATIVE NEGATIVE Final    Comment: (NOTE) The Xpert Xpress SARS-CoV-2/FLU/RSV plus assay is intended as an aid in the diagnosis of influenza from Nasopharyngeal swab specimens and should not be used as a sole basis for treatment. Nasal washings and aspirates are unacceptable for Xpert Xpress SARS-CoV-2/FLU/RSV testing.  Fact Sheet for Patients: EntrepreneurPulse.com.au  Fact Sheet for Healthcare Providers: IncredibleEmployment.be  This test is not yet approved or cleared by the [place] FDA and has been authorized for detection and/or diagnosis of SARS-CoV-2 by FDA under an Emergency Use Authorization (EUA). This EUA will remain in effect (meaning this test can be used) for the duration of the COVID-19 declaration under Section 564(b)(1) of the Act, 21 U.S.C. section 360bbb-3(b)(1), unless the authorization is terminated or revoked.  Performed at [facility], [address] [place],  [place] [address]          Radiology Studies: CT HEAD WO CONTRAST (5MM)  Result Date: 01/12/2023 CLINICAL DATA:  Provided history: Mental status change, unknown cause. EXAM: CT  HEAD WITHOUT CONTRAST TECHNIQUE: Contiguous axial images were obtained from the base of the skull through the vertex without intravenous contrast. RADIATION DOSE REDUCTION: This exam was performed according to the departmental dose-optimization program which includes automated exposure control, adjustment of the mA and/or kV according to patient size and/or use of iterative reconstruction technique. COMPARISON:  Head CT 01/05/2023. FINDINGS: Brain: Redemonstrated large chronic right PCA territory cortical/subcortical infarct affecting portions of the right temporal, occipital and parietal lobes. Cerebral atrophy with ventriculomegaly, unchanged from the recent prior head CT of 01/05/2023. Background mild patchy and ill-defined hypoattenuation within cerebral white matter, nonspecific but compatible with chronic small vessel disease. There is no acute intracranial hemorrhage. No acute demarcated cortical infarct. No extra-axial fluid collection. No evidence of an intracranial mass. No midline shift. Vascular: No hyperdense vessel. Atherosclerotic calcifications. Skull: No fracture or aggressive osseous lesion. Sinuses/Orbits: No mass or acute finding within the imaged orbits. Streak/beam hardening artifact arising from the left orbit, presumably arising from an eyelid weight. No significant paranasal sinus disease at the imaged levels. Other: Prior left mastoidectomy. IMPRESSION: 1. No evidence of acute intracranial hemorrhage or acute infarct. 2. Chronic right PCA territory infarct, chronic small vessel ischemic disease, atrophy and ventriculomegaly, unchanged from the recent prior head CT of 01/05/2023. Electronically Signed   By: Kellie Simmering D.O.   On: 01/12/2023 14:59   CT ABDOMEN PELVIS WO CONTRAST  Result Date: 01/11/2023 CLINICAL DATA:  Follow-up hydronephrosis EXAM: CT ABDOMEN AND PELVIS WITHOUT CONTRAST TECHNIQUE: Multidetector CT imaging of the abdomen and pelvis was performed following the standard  protocol without IV contrast. RADIATION DOSE REDUCTION: This exam was performed according to the departmental dose-optimization program which includes automated exposure control, adjustment of the mA and/or kV according to patient size and/or use of iterative reconstruction technique. COMPARISON:  01/05/23 FINDINGS: Lower chest: Minimal right basilar atelectasis is noted. Hepatobiliary: No focal liver abnormality is seen. No gallstones, gallbladder wall thickening, or biliary dilatation. Pancreas: Unremarkable. No pancreatic ductal dilatation or surrounding inflammatory changes. Spleen: Normal in size without focal abnormality. Adrenals/Urinary Tract: Adrenal glands are within normal limits bilaterally. Right kidney demonstrates a punctate nonobstructing renal stone better visualized than on the prior exam. Left kidney demonstrates slight reduction in hydronephrosis secondary to the interval stent placement. The left UPJ stone is again identified within the renal pelvis. The more distal ureter is within normal limits. The bladder is well distended. Stomach/Bowel: Diverticular change of the colon is noted without evidence of diverticulitis. Appendix has been surgically removed. No obstructive changes of the colon are seen. Small bowel and stomach are within normal limits. Vascular/Lymphatic: Aortic atherosclerosis. No enlarged abdominal or pelvic lymph nodes. Reproductive: Prostate is unremarkable. Other: No abdominal wall hernia or abnormality. No abdominopelvic ascites. Musculoskeletal: No acute bony abnormality is noted. IMPRESSION: Previously seen left UPJ stone is been dislodged into the left renal pelvis with ureteral stent placement. The degree of hydronephrosis has improved significantly from the prior exam consistent with the stone repositioning and stent placement. Mild right basilar atelectasis is again noted. 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 Electronically Signed   By: Inez Catalina M.D.   On:  01/11/2023 22:44        Scheduled Meds:  alfuzosin  10 mg Oral q AM   ARIPiprazole  15 mg Oral Daily  aspirin  81 mg Oral Daily   atorvastatin  20 mg Oral QHS   clonazePAM  1 mg Oral TID   enoxaparin (LOVENOX) injection  40 mg Subcutaneous Q24H   feeding supplement  237 mL Oral TID BM   liothyronine  25 mcg Oral Daily   lisinopril  40 mg Oral Daily   multivitamin with minerals  1 tablet Oral Daily   polyethylene glycol  17 g Oral QODAY   traZODone  50 mg Oral QHS   Continuous Infusions:  ampicillin (OMNIPEN) IV 2 g (01/13/23 0802)     LOS: 8 days    Time spent: 33 mins    Wyvonnia Dusky, MD [facility] Pager [phone]  If 7PM-7AM, please contact night-coverage [email or web address] 01/13/2023, 8:52 AM

## 2023-01-13 NOTE — TOC Progression Note (Signed)
Transition of Care [facility]) - Progression Note    Patient Details  Name: Johnny Rice MRN: [id number] Date of Birth: 09/20/1946  Transition of Care [facility]) CM/SW Contact  Beverly Sessions, RN Phone Number: 01/13/2023, 1:53 PM  Clinical Narrative:     Bed offers reviewed with daughter Myra.  She is going to let me know her final decision by tomorrow    Per MD patient not medically ready for discharge, still febrile     Expected Discharge Plan and Services                                               Social Determinants of Health (SDOH) Interventions SDOH Screenings   Food Insecurity: Patient Unable To Answer (01/05/2023)  Housing: Low Risk  (01/05/2023)  Transportation Needs: Patient Unable To Answer (01/05/2023)  Utilities: Not At Risk (01/05/2023)  Tobacco Use: Low Risk  (01/06/2023)    Readmission Risk Interventions    01/07/2023    4:06 PM  Readmission Risk Prevention Plan  Transportation Screening Complete  PCP or Specialist appointment within 3-5 days of discharge Complete  HRI or [place] Complete  SW Recovery Care/Counseling Consult Complete  [place] Complete

## 2023-01-13 NOTE — Progress Notes (Signed)
Date of Admission:  01/05/2023      ID: Johnny Rice is a 77 y.o. male  Principal Problem:   Severe sepsis ([place]) Active Problems:   BPH (benign prostatic hyperplasia)   History of stroke   Essential hypertension   Complicated UTI (urinary tract infection)   Hypothyroid   Obstructive uropathy   AKI (acute kidney injury) ([place])   Acute metabolic encephalopathy   Tongue laceration   Kidney stone   Sinus bradycardia   Chest discomfort   Bacteremia due to Enterococcus  Pt more alert today Some hiccups Answering questions  Medications:   alfuzosin  10 mg Oral q AM   ARIPiprazole  15 mg Oral Daily   aspirin  81 mg Oral Daily   atorvastatin  20 mg Oral QHS   clonazePAM  1 mg Oral TID   enoxaparin (LOVENOX) injection  40 mg Subcutaneous Q24H   feeding supplement  237 mL Oral TID BM   liothyronine  25 mcg Oral Daily   lisinopril  40 mg Oral Daily   multivitamin with minerals  1 tablet Oral Daily   polyethylene glycol  17 g Oral QODAY   traZODone  50 mg Oral QHS    Objective: Vital signs in last 24 hours: Patient Vitals for the past 24 hrs:  BP Temp Temp src Pulse Resp SpO2 Weight  01/13/23 0850 (!) 150/90 100 F (37.8 C) Oral 84 18 97 % --  01/13/23 0500 136/76 98.4 F (36.9 C) Oral 94 17 99 % --  01/13/23 0444 -- -- -- -- -- -- 79.7 kg  01/12/23 1950 139/89 98.4 F (36.9 C) Oral (!) 101 16 99 % --  01/12/23 1525 124/80 98.8 F (37.1 C) Oral 94 16 94 % --      PHYSICAL EXAM: More alert Responds to questions Follows commands Oral cavity- rt side of tongue ulcer Chest b/l air entry Hss1s2 Abd soft Lymph: Cervical, supraclavicular normal. Neurologic: left facial palsy  Left arm at the site of previous Line swelling, erythema and some tenderness     Lab Results    Latest Ref Rng & Units 01/13/2023    5:23 AM 01/12/2023    5:57 AM 01/11/2023    6:15 AM  CBC  WBC 4.0 - 10.5 K/uL 13.3  10.8  10.3   Hemoglobin 13.0 - 17.0 g/dL 10.7  10.5  10.6   Hematocrit  39.0 - 52.0 % 31.9  31.5  31.5   Platelets 150 - 400 K/uL 410  372  338        Latest Ref Rng & Units 01/13/2023    5:23 AM 01/12/2023    5:57 AM 01/11/2023    6:15 AM  CMP  Glucose 70 - 99 mg/dL 118  107  112   BUN 8 - 23 mg/dL 18  15  16    Creatinine 0.61 - 1.24 mg/dL 0.96  0.86  0.88   Sodium 135 - 145 mmol/L 135  138  138   Potassium 3.5 - 5.1 mmol/L 3.8  3.7  3.4   Chloride 98 - 111 mmol/L 104  101  109   CO2 22 - 32 mmol/L 24  24  25    Calcium 8.9 - 10.3 mg/dL 8.3  8.6  8.2       Microbiology: 01/05/23 BC Enterococcus bacteremia 01/07/23 BCNG   Assessment/Plan:  Encephalopathy waxing and waning   likely due to medication- Chlorpromazine, oxycodone, Klonipin with underling h/o stroke CT head no acute findings-  old infarcts   Sepsis secondary to complicated UTI from obstructive uropathy due to a stone on the left side causing hydronephrosis.  Status post stent placement.   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

## 2023-01-13 NOTE — Progress Notes (Signed)
Nutrition Follow Up Notes   DOCUMENTATION CODES:   Not applicable  INTERVENTION:   Recommend NGT placement and nutrition support if family wishes to pursue full aggressive care.   Ensure Enlive po TID, each supplement provides 350 kcal and 20 grams of protein.  Add Magic cup TID with meals, each supplement provides 290 kcal and 9 grams of protein  MVI po daily   If tube feeds initiated, recommend:  Osmolite 1.5@60ml /hr- Initiate at 81ml/hr and increase by 32ml/hr q 8 hours until goal rate is reached.   Free water flushes 23ml q4 hours to maintain tube patency   Regimen provides 2160kcal/day, 90g/day protein and 1292ml/day of free water   Pt at high refeed risk; recommend monitor potassium, magnesium and phosphorus labs daily until stable  NUTRITION DIAGNOSIS:   Inadequate oral intake related to acute illness as evidenced by meal completion < 25%.  GOAL:   Patient will meet greater than or equal to 90% of their needs -not met   MONITOR:   PO intake, Supplement acceptance, Labs, Weight trends, I & O's, Skin  ASSESSMENT:   77 y/o male with h/o BPH, CVA, HTN, HLD, hypothyroidsm, etoh abuse, depression and anxiety who is admitted with bacteremia and sepsis secondary to obstructive uropathy s/p left ureteral stent 3/18.  Pt continues to have poor appetite and oral intake in hospital. Pt eating only sips and bites of meals and often refuses. RN reports pt drinking only sips of Ensure. Pt has now been without adequate nutrition for > 7 days. If family wishes to pursue full agressive care, recommend NGT placement and nutrition support; this was discussed with MD. Pt remains at high refeed risk. RD will monitor for GOC.   Per chart, pt is up ~8lbs since admission.   Medications reviewed and include: aspirin, lovenox, MVI, miralax, omnipen   Labs reviewed: K 3.8 wnl Mg 1.6(L)- 3/25 Wbc- 13.3(H), Hgb 10.7(L), Hct 31.9(L)  Diet Order:   Diet Order             DIET DYS 2  Room service appropriate? Yes; Fluid consistency: Thin  Diet effective now                  EDUCATION NEEDS:   Education needs have been addressed  Skin:  Skin Assessment: Reviewed RN Assessment  Last BM:  3/24  Height:   Ht Readings from Last 1 Encounters:  01/05/23 5\' 7"  (1.702 m)    Weight:   Wt Readings from Last 1 Encounters:  01/13/23 79.7 kg    Ideal Body Weight:  67.2 kg  BMI:  Body mass index is 27.52 kg/m.  Estimated Nutritional Needs:   Kcal:  1800-2100kcal/day  Protein:  90-105g/day  Fluid:  1.7-2.0L/day  Koleen Distance MS, RD, LDN Please refer to [facility] for RD and/or RD on-call/weekend/after hours pager

## 2023-01-14 ENCOUNTER — Inpatient Hospital Stay: Payer: Medicare Other

## 2023-01-14 DIAGNOSIS — R6520 Severe sepsis without septic shock: Secondary | ICD-10-CM | POA: Diagnosis not present

## 2023-01-14 DIAGNOSIS — A419 Sepsis, unspecified organism: Secondary | ICD-10-CM | POA: Diagnosis not present

## 2023-01-14 LAB — BASIC METABOLIC PANEL
Anion gap: 9 (ref 5–15)
BUN: 19 mg/dL (ref 8–23)
CO2: 25 mmol/L (ref 22–32)
Calcium: 8.6 mg/dL — ABNORMAL LOW (ref 8.9–10.3)
Chloride: 105 mmol/L (ref 98–111)
Creatinine, Ser: 0.87 mg/dL (ref 0.61–1.24)
GFR, Estimated: >60 mL/min (ref >60)
Glucose, Bld: 122 mg/dL — ABNORMAL HIGH (ref 70–99)
Potassium: 3.7 mmol/L (ref 3.5–5.1)
Sodium: 139 mmol/L (ref 135–145)

## 2023-01-14 LAB — CBC
HCT: 31.9 % — ABNORMAL LOW (ref 39.0–52.0)
Hemoglobin: 10.7 g/dL — ABNORMAL LOW (ref 13.0–17.0)
MCH: 29.2 pg (ref 26.0–34.0)
MCHC: 33.5 g/dL (ref 30.0–36.0)
MCV: 86.9 fL (ref 80.0–100.0)
Platelets: 399 10*3/uL (ref 150–400)
RBC: 3.67 MIL/uL — ABNORMAL LOW (ref 4.22–5.81)
RDW: 14.9 % (ref 11.5–15.5)
WBC: 14.3 10*3/uL — ABNORMAL HIGH (ref 4.0–10.5)
nRBC: 0 % (ref 0.0–0.2)

## 2023-01-14 LAB — MAGNESIUM: Magnesium: 2 mg/dL (ref 1.7–2.4)

## 2023-01-14 LAB — PHOSPHORUS: Phosphorus: 4.4 mg/dL (ref 2.5–4.6)

## 2023-01-14 MED ORDER — ONDANSETRON HCL 4 MG/2ML IJ SOLN
4.0000 mg | Freq: Four times a day (QID) | INTRAMUSCULAR | Status: DC | PRN
  Administered 2023-01-14: 4 mg via INTRAVENOUS
  Filled 2023-01-14: qty 2

## 2023-01-14 MED ORDER — ONDANSETRON 4 MG PO TBDP: 4.0000 mg | ORAL_TABLET | Freq: Three times a day (TID) | ORAL | Status: DC | PRN

## 2023-01-14 MED ORDER — TRAZODONE HCL 50 MG PO TABS
50.0000 mg | ORAL_TABLET | Freq: Every evening | ORAL | Status: DC | PRN
  Administered 2023-01-14 – 2023-01-18 (×3): 50 mg via ORAL
  Filled 2023-01-14 (×3): qty 1

## 2023-01-14 MED ORDER — CLONAZEPAM 0.5 MG PO TABS
0.5000 mg | ORAL_TABLET | Freq: Two times a day (BID) | ORAL | Status: DC
  Administered 2023-01-14 – 2023-01-16 (×5): 0.5 mg via ORAL
  Filled 2023-01-14 (×4): qty 1

## 2023-01-14 NOTE — Progress Notes (Signed)
PROGRESS NOTE    Johnny Rice  [id number] DOB: [birth year] DOA: 01/05/2023 PCP: [facility]  [id number]  LOS: 9 days   Brief hospital course:   Assessment & Plan: Johnny Rice is a 77 y.o. male with medical history significant of hypertension, CVA, hyperlipidemia, hypothyroidism, BPH, cognitive impairment presenting with sepsis, obstructive uropathy, encephalopathy.   Severe sepsis: present on admission. Met criteria w/ fever, tachycardia, leukocytosis, AKI, & bacteremia. Blood cxs growing enterococcus. Continue on IV amp as per ID. 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 --cont IV amp   Acute metabolic encephalopathy:  --Pt was noted to be drowsy.  Repeat CT head shows no acute intracranial abnormalities.  --it appears that pt was ordered Klonopin 1 mg TID here while home dose was 0.5 mg TID.  Also has been getting opioids pain meds.   --reduce sedating meds. --order home Klonopin as 0.5 mg BID  Obstructive uropathy:  S/p left ureteral stent placed by Dr. Erlene Quan on 3/18. CT scan showing a 7 mm obstructing stone at the left UVJ with hydronephrosis and perinephric stranding.  Repeat CT abd/pelvis shows the degree of hydronephrosis has improved significantly.  Continue on IV amp as per ID.   Intermittent low-grade fever --per ID, likely from thrombophlebitis left arm Covid/flu negative CT abdomen hydronephrosis left side better 2 d echo no vegetation   Dementia:  continue w/ supportive care    AKI: resolved    Hx of CVA:  continue on aspirin, statin      HTN:  --cont Lisinopril --Holding BB secondary to bradycardia   BPH:  continue on uroxatral    Hypothyroidism:  continue on liothyronine    Chest discomfort: likely not cardiac. Troponin neg x 2.    Sinus bradycardia:  continue to hold metoprolol     Tongue laceration: continue on viscous lidocaine  Bilateral knee pain --per daughter, this is new --xray of both knees.    DVT prophylaxis: Lovenox SQ Code Status: Full code  Family Communication: daughter updated at bedside today Level of care: Med-Surg Dispo:   The patient is from: ALF Anticipated d/c is to: SNF rehab Anticipated d/c date is: to be determined   Subjective and Interval History:  Pt appeared somnolent, but when awake, complained of bilateral knee pain which was new (per daughter).   Objective: Vitals:   01/14/23 0500 01/14/23 0542 01/14/23 0830 01/14/23 1545  BP:  136/80 138/82 (!) 164/98  Pulse:  95 68 (!) 105  Resp:  17 20 20   Temp:  98.2 F (36.8 C) 98.2 F (36.8 C) 98.5 F (36.9 C)  TempSrc:  Oral    SpO2:  98% 98% 100%  Weight: 79.7 kg     Height:        Intake/Output Summary (Last 24 hours) at 01/14/2023 1929 Last data filed at 01/14/2023 1900 Gross per 24 hour  Intake 0 ml  Output 800 ml  Net -800 ml   Filed Weights   01/12/23 0500 01/13/23 0444 01/14/23 0500  Weight: 64.5 kg 79.7 kg 79.7 kg    Examination:   Constitutional: NAD, lethargic CV: No cyanosis.   RESP: normal respiratory effort, on RA Extremities: No erythema or warmth over both knees SKIN: warm, dry   Data Reviewed: I have personally reviewed labs and imaging studies  Time spent: 50 minutes  Enzo Bi, MD [facility] If 7PM-7AM, please contact night-coverage  01/14/2023, 7:29 PM

## 2023-01-14 NOTE — TOC Progression Note (Signed)
Transition of Care [facility]) - Progression Note    Patient Details  Name: Schaun Decarli MRN: [id number] Date of Birth: 1946-04-17  Transition [facility]) CM/SW Contact  Beverly Sessions, RN Phone Number: 01/14/2023, 7:21 PM  Clinical Narrative:      Voicemail left for daughter Myra to follow up on bed offers      Expected Discharge Plan and Services                                               Social Determinants of Health (SDOH) Interventions SDOH Screenings   Food Insecurity: Patient Unable To Answer (01/05/2023)  Housing: Low Risk  (01/05/2023)  Transportation Needs: Patient Unable To Answer (01/05/2023)  Utilities: Not At Risk (01/05/2023)  Tobacco Use: Low Risk  (01/06/2023)    Readmission Risk Interventions    01/07/2023    4:06 PM  Readmission Risk Prevention Plan  Transportation Screening Complete  PCP or Specialist appointment within 3-5 days of discharge Complete  HRI or [place] Complete  SW Recovery Care/Counseling Consult Complete  [place] Complete

## 2023-01-14 NOTE — Progress Notes (Incomplete)
Date of Admission:  01/05/2023      ID: Johnny Rice is a 77 y.o. male  Principal Problem:   Severe sepsis ([place]) Active Problems:   BPH (benign prostatic hyperplasia)   History of stroke   Essential hypertension   Complicated UTI (urinary tract infection)   Hypothyroid   Obstructive uropathy   AKI (acute kidney injury) ([place])   Acute metabolic encephalopathy   Tongue laceration   Kidney stone   Sinus bradycardia   Chest discomfort   Bacteremia due to Enterococcus   Thrombophlebitis  Pt more alert today Some hiccups Answering questions  Medications:   alfuzosin  10 mg Oral q AM   ARIPiprazole  15 mg Oral Daily   aspirin  81 mg Oral Daily   atorvastatin  20 mg Oral QHS   clonazePAM  0.5 mg Oral BID   enoxaparin (LOVENOX) injection  40 mg Subcutaneous Q24H   feeding supplement  237 mL Oral TID BM   liothyronine  25 mcg Oral Daily   lisinopril  40 mg Oral Daily   multivitamin with minerals  1 tablet Oral Daily   polyethylene glycol  17 g Oral QODAY    Objective: Vital signs in last 24 hours: Patient Vitals for the past 24 hrs:  BP Temp Temp src Pulse Resp SpO2 Weight  01/14/23 1545 (!) 164/98 98.5 F (36.9 C) -- (!) 105 20 100 % --  01/14/23 0830 138/82 98.2 F (36.8 C) -- 68 20 98 % --  01/14/23 0542 136/80 98.2 F (36.8 C) Oral 95 17 98 % --  01/14/23 0500 -- -- -- -- -- -- 79.7 kg  01/13/23 1950 (!) 141/87 98.4 F (36.9 C) Oral (!) 106 18 95 % --      PHYSICAL EXAM: More alert Responds to questions Follows commands Oral cavity- rt side of tongue ulcer Chest b/l air entry Hss1s2 Abd soft Lymph: Cervical, supraclavicular normal. Neurologic: left facial palsy  Left arm at the site of previous Line swelling, erythema and some tenderness     Lab Results    Latest Ref Rng & Units 01/14/2023    4:52 AM 01/13/2023    5:23 AM 01/12/2023    5:57 AM  CBC  WBC 4.0 - 10.5 K/uL 14.3  13.3  10.8   Hemoglobin 13.0 - 17.0 g/dL 10.7  10.7  10.5   Hematocrit  39.0 - 52.0 % 31.9  31.9  31.5   Platelets 150 - 400 K/uL 399  410  372        Latest Ref Rng & Units 01/14/2023    4:52 AM 01/13/2023    5:23 AM 01/12/2023    5:57 AM  CMP  Glucose 70 - 99 mg/dL 122  118  107   BUN 8 - 23 mg/dL 19  18  15    Creatinine 0.61 - 1.24 mg/dL 0.87  0.96  0.86   Sodium 135 - 145 mmol/L 139  135  138   Potassium 3.5 - 5.1 mmol/L 3.7  3.8  3.7   Chloride 98 - 111 mmol/L 105  104  101   CO2 22 - 32 mmol/L 25  24  24    Calcium 8.9 - 10.3 mg/dL 8.6  8.3  8.6       Microbiology: 01/05/23 BC Enterococcus bacteremia 01/07/23 BCNG   Assessment/Plan:  Encephalopathy waxing and waning   likely due to medication- Chlorpromazine, oxycodone, Klonipin with underling h/o stroke CT head no acute findings- old infarcts  Sepsis secondary to complicated UTI from obstructive uropathy due to a stone on the left side causing hydronephrosis.  Status post stent placement.   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

## 2023-01-14 NOTE — Progress Notes (Signed)
PT Cancellation Note  Patient Details Name: Newell Olander MRN: [id number] DOB: 04/02/1946   Cancelled Treatment:    Reason Eval/Treat Not Completed: Fatigue/lethargy limiting ability to participate Patient lethargic. Asked for pain medicine and fell back to sleep. Discussed with nursing. He declined attempting to get up at this time. Will re-attempt tomorrow.   Delaine Hernandez 01/14/2023, 2:31 PM

## 2023-01-15 DIAGNOSIS — A419 Sepsis, unspecified organism: Secondary | ICD-10-CM | POA: Diagnosis not present

## 2023-01-15 DIAGNOSIS — N139 Obstructive and reflux uropathy, unspecified: Secondary | ICD-10-CM | POA: Diagnosis not present

## 2023-01-15 DIAGNOSIS — N200 Calculus of kidney: Secondary | ICD-10-CM | POA: Diagnosis not present

## 2023-01-15 DIAGNOSIS — N390 Urinary tract infection, site not specified: Secondary | ICD-10-CM | POA: Diagnosis not present

## 2023-01-15 DIAGNOSIS — R7881 Bacteremia: Secondary | ICD-10-CM | POA: Diagnosis not present

## 2023-01-15 DIAGNOSIS — R6520 Severe sepsis without septic shock: Secondary | ICD-10-CM | POA: Diagnosis not present

## 2023-01-15 LAB — CBC
HCT: 33.5 % — ABNORMAL LOW (ref 39.0–52.0)
Hemoglobin: 10.9 g/dL — ABNORMAL LOW (ref 13.0–17.0)
MCH: 28.8 pg (ref 26.0–34.0)
MCHC: 32.5 g/dL (ref 30.0–36.0)
MCV: 88.4 fL (ref 80.0–100.0)
Platelets: 455 10*3/uL — ABNORMAL HIGH (ref 150–400)
RBC: 3.79 MIL/uL — ABNORMAL LOW (ref 4.22–5.81)
RDW: 14.8 % (ref 11.5–15.5)
WBC: 13.1 10*3/uL — ABNORMAL HIGH (ref 4.0–10.5)
nRBC: 0 % (ref 0.0–0.2)

## 2023-01-15 LAB — BASIC METABOLIC PANEL
Anion gap: 12 (ref 5–15)
BUN: 18 mg/dL (ref 8–23)
CO2: 23 mmol/L (ref 22–32)
Calcium: 8.9 mg/dL (ref 8.9–10.3)
Chloride: 104 mmol/L (ref 98–111)
Creatinine, Ser: 0.76 mg/dL (ref 0.61–1.24)
GFR, Estimated: >60 mL/min (ref >60)
Glucose, Bld: 96 mg/dL (ref 70–99)
Potassium: 4.1 mmol/L (ref 3.5–5.1)
Sodium: 139 mmol/L (ref 135–145)

## 2023-01-15 LAB — MAGNESIUM: Magnesium: 2 mg/dL (ref 1.7–2.4)

## 2023-01-15 LAB — PHOSPHORUS: Phosphorus: 4 mg/dL (ref 2.5–4.6)

## 2023-01-15 NOTE — Progress Notes (Signed)
Physical Therapy Treatment Patient Details Name: Johnny Rice MRN: [id number] DOB: Sep 17, 1946 Today's Date: 01/15/2023   History of Present Illness presented to ER secondary to fever, nausea/vomiting; admitted for sepsis, encephalopathy related to obstructive uropathy.    PT Comments    Patient received in supine, RN in room. He is more alert and talkative this date, very pleasant. HOH (speak into right ear). Patient reports back pain, but is agreeable to sitting at edge of bed. He requires max/total assist to achieve balanced sitting on side of bed. Supervision needed once positioned. Patient has poor sitting tolerance requesting to lie back down only after a minute or so, but was able to tolerate another minute. He will continue to benefit from skilled PT to improve functional independence and safety with mobility.        Recommendations for follow up therapy are one component of a multi-disciplinary discharge planning process, led by the attending physician.  Recommendations may be updated based on patient status, additional functional criteria and insurance authorization.  Follow Up Recommendations  Can patient physically be transported by private vehicle: No    Assistance Recommended at Discharge Frequent or constant Supervision/Assistance  Patient can return home with the following Two people to help with walking and/or transfers;Two people to help with bathing/dressing/bathroom   Equipment Recommendations  None recommended by PT    Recommendations for Other Services       Precautions / Restrictions Precautions Precautions: Fall Precaution Comments: Prior CVA with L side weakness Restrictions Weight Bearing Restrictions: No     Mobility  Bed Mobility Overal bed mobility: Needs Assistance Bed Mobility: Supine to Sit, Sit to Supine     Supine to sit: Total assist Sit to supine: Total assist   General bed mobility comments: Able to sit on side of bed with total assist  to get trunk elevation, legs off side of bed. Once postitioned he is able to sit with supervision x 2 minutes. Requesting to lie back down after just a minute or so    Transfers                   General transfer comment: did not attempt due to limited mobility in bed    Ambulation/Gait               General Gait Details: unsafe/unable   Stairs             Wheelchair Mobility    Modified Rankin (Stroke Patients Only)       Balance Overall balance assessment: Needs assistance Sitting-balance support: Feet supported, Single extremity supported Sitting balance-Leahy Scale: Fair Sitting balance - Comments: supervision once positioned sitting at edge of bed. Poor tolerance                                    Cognition Arousal/Alertness: Awake/alert Behavior During Therapy: Flat affect Overall Cognitive Status: Difficult to assess                                          Exercises      General Comments        Pertinent Vitals/Pain Pain Assessment Pain Assessment: PAINAD Breathing: normal Negative Vocalization: occasional moan/groan, low speech, negative/disapproving quality Facial Expression: smiling or inexpressive Body Language: relaxed Consolability: no need to console PAINAD  Score: 1 Pain Location: Patient states he has back pain. Unable to elaborate Pain Descriptors / Indicators: Grimacing Pain Intervention(s): Monitored during session, Repositioned    Home Living                          Prior Function            PT Goals (current goals can now be found in the care plan section) Acute Rehab PT Goals Patient Stated Goal: patient unable to state PT Goal Formulation: Patient unable to participate in goal setting Time For Goal Achievement: 01/20/23 Progress towards PT goals: Progressing toward goals    Frequency    Min 2X/week      PT Plan Current plan remains appropriate     Co-evaluation              AM-PAC PT "6 Clicks" Mobility   Outcome Measure  Help needed turning from your back to your side while in a flat bed without using bedrails?: Total Help needed moving from lying on your back to sitting on the side of a flat bed without using bedrails?: Total Help needed moving to and from a bed to a chair (including a wheelchair)?: Total Help needed standing up from a chair using your arms (e.g., wheelchair or bedside chair)?: Total Help needed to walk in hospital room?: Total   6 Click Score: 5    End of Session   Activity Tolerance: Patient limited by pain;Patient limited by fatigue Patient left: in bed;with call bell/phone within reach;with bed alarm set Nurse Communication: Mobility status PT Visit Diagnosis: Muscle weakness (generalized) (M62.81);Other abnormalities of gait and mobility (R26.89);Pain Pain - part of body:  (back)     Time: [id number] PT Time Calculation (min) (ACUTE ONLY): 11 min  Charges:  $Therapeutic Activity: 8-22 mins                     Desarae Placide, PT, GCS 01/15/23,10:50 AM

## 2023-01-15 NOTE — Progress Notes (Signed)
PROGRESS NOTE    Johnny Rice  [id number] DOB: 1946/05/06 DOA: 01/05/2023 PCP: [facility]  209A/209A-AA  LOS: 10 days   Brief hospital course:   Assessment & Plan: Lealand Rizk is a 77 y.o. male with medical history significant of hypertension, CVA, hyperlipidemia, hypothyroidism, BPH, cognitive impairment presenting with sepsis, obstructive uropathy, encephalopathy.   Severe sepsis: present on admission. Met criteria w/ fever, tachycardia, leukocytosis, AKI, & bacteremia. Blood cxs growing enterococcus. Continue on IV amp as per ID. 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 --cont IV amp   Acute metabolic encephalopathy:  --Pt was noted to be drowsy.  Repeat CT head shows no acute intracranial abnormalities.  --it appears that pt was ordered Klonopin 1 mg TID here while home dose was 0.5 mg TID.  Also has been getting opioids pain meds.   --reduce sedating meds. --order home Klonopin as 0.5 mg BID  Obstructive uropathy:  S/p left ureteral stent placed by Dr. Erlene Quan on 3/18. CT scan showing a 7 mm obstructing stone at the left UVJ with hydronephrosis and perinephric stranding.  Repeat CT abd/pelvis shows the degree of hydronephrosis has improved significantly.  Continue on IV amp as per ID.   Intermittent low-grade fever --per ID, likely from thrombophlebitis left arm Covid/flu negative CT abdomen hydronephrosis left side better 2 d echo no vegetation   Dementia:  continue w/ supportive care    AKI: resolved    Hx of CVA:  continue on aspirin, statin      HTN:  --cont Lisinopril --Holding BB secondary to bradycardia   BPH:  continue on uroxatral    Hypothyroidism:  continue on liothyronine    Chest discomfort: likely not cardiac. Troponin neg x 2.    Sinus bradycardia:  continue to hold metoprolol     Tongue laceration: continue on viscous lidocaine  Bilateral knee pain --per daughter, this is new --xray of both knees with no acute finding    DVT prophylaxis: Lovenox SQ Code Status: Full code  Family Communication:  Level of care: Med-Surg Dispo:   The patient is from: ALF Anticipated d/c is to: SNF rehab Anticipated d/c date is: to be determined   Subjective and Interval History:  Pt responded with "yes" to being hungry, however, RN reported little oral intake.   Objective: Vitals:   01/15/23 0747 01/15/23 1515 01/15/23 1626 01/15/23 2012  BP: (!) 153/98 136/82 114/74 128/66  Pulse: 87 (!) 118 (!) 103 100  Resp: 16 20  18   Temp: 98.4 F (36.9 C) 99.3 F (37.4 C) (!) 97.5 F (36.4 C) 97.8 F (36.6 C)  TempSrc: Oral  Oral Oral  SpO2: 97% 94% 94% 96%  Weight:      Height:        Intake/Output Summary (Last 24 hours) at 01/15/2023 2035 Last data filed at 01/15/2023 1434 Gross per 24 hour  Intake 140 ml  Output 500 ml  Net -360 ml   Filed Weights   01/13/23 0444 01/14/23 0500 01/15/23 0500  Weight: 79.7 kg 79.7 kg 79.7 kg    Examination:   Constitutional: NAD, lethargic CV: No cyanosis.   RESP: normal respiratory effort, on RA Extremities: No effusions, edema in BLE SKIN: warm, dry   Data Reviewed: I have personally reviewed labs and imaging studies  Time spent: 25 minutes  Enzo Bi, MD [facility] If 7PM-7AM, please contact night-coverage 01/15/2023,  8:35 PM

## 2023-01-15 NOTE — Care Management Important Message (Signed)
Important Message  Patient Details  Name: Johnny Rice MRN: [id number] Date of Birth: Feb 13, 1946   Medicare Important Message Given:  Yes     Dannette Barbara 01/15/2023, 12:44 PM

## 2023-01-15 NOTE — TOC Progression Note (Addendum)
Transition of Care [facility]) - Progression Note    Patient Details  Name: Johnny Rice MRN: [id number] Date of Birth: 02/17/1946  Transition of Care [facility]) CM/SW [place], LCSW Phone Number: 01/15/2023, 2:19 PM  Clinical Narrative:   Called daughter regarding bed offers. She lives in the [place] area and asked if we could find placement closer to her. Faxed referral to [facility] referral to [employer] on hub and left message for admissions coordinator asking her to review. Left voicemail for [facility]. [facility] only accepts their own residents. Per daughter, patient may need LTC after rehab.  3:54 pm: [facility] doesn't have any beds.  Expected Discharge Plan and Services                                               Social Determinants of Health (SDOH) Interventions SDOH Screenings   Food Insecurity: Patient Unable To Answer (01/05/2023)  Housing: Low Risk  (01/05/2023)  Transportation Needs: Patient Unable To Answer (01/05/2023)  Utilities: Not At Risk (01/05/2023)  Tobacco Use: Low Risk  (01/06/2023)    Readmission Risk Interventions    01/07/2023    4:06 PM  Readmission Risk Prevention Plan  Transportation Screening Complete  PCP or Specialist appointment within 3-5 days of discharge Complete  HRI or [place] Complete  SW Recovery Care/Counseling Consult Complete  [place] Complete

## 2023-01-15 NOTE — Progress Notes (Signed)
Date of Admission:  01/05/2023      ID: Johnny Rice is a 77 y.o. male  Principal Problem:   Severe sepsis ([place]) Active Problems:   BPH (benign prostatic hyperplasia)   History of stroke   Essential hypertension   Complicated UTI (urinary tract infection)   Hypothyroid   Obstructive uropathy   AKI (acute kidney injury) ([place])   Acute metabolic encephalopathy   Tongue laceration   Kidney stone   Sinus bradycardia   Chest discomfort   Bacteremia due to Enterococcus   Thrombophlebitis  Daughter at bed side He is lethargic Does respond to some questions  Medications:   alfuzosin  10 mg Oral q AM   ARIPiprazole  15 mg Oral Daily   aspirin  81 mg Oral Daily   atorvastatin  20 mg Oral QHS   clonazePAM  0.5 mg Oral BID   enoxaparin (LOVENOX) injection  40 mg Subcutaneous Q24H   feeding supplement  237 mL Oral TID BM   liothyronine  25 mcg Oral Daily   lisinopril  40 mg Oral Daily   multivitamin with minerals  1 tablet Oral Daily   polyethylene glycol  17 g Oral QODAY    Objective: Vital signs in last 24 hours: Patient Vitals for the past 24 hrs:  BP Temp Temp src Pulse Resp SpO2 Weight  01/15/23 1626 114/74 (!) 97.5 F (36.4 C) Oral (!) 103 -- 94 % --  01/15/23 1515 136/82 99.3 F (37.4 C) -- (!) 118 20 94 % --  01/15/23 0747 (!) 153/98 98.4 F (36.9 C) Oral 87 16 97 % --  01/15/23 0621 (!) 149/93 98.1 F (36.7 C) -- 84 20 99 % --  01/15/23 0500 -- -- -- -- -- -- 79.7 kg  01/14/23 1956 138/84 98.6 F (37 C) Oral 96 18 98 % --     O/E Lethargic  Responds to questions Follows commands Rt ala nasi - scab Chest b/l air entry Hss1s2 Abd soft Lymph: Cervical, supraclavicular normal. Neurologic: left facial palsy  Left arm at the site of previous Line swelling, erythema and some tenderness        Left sided weakness Lab Results    Latest Ref Rng & Units 01/15/2023    6:16 AM 01/14/2023    4:52 AM 01/13/2023    5:23 AM  CBC  WBC 4.0 - 10.5 K/uL 13.1   14.3  13.3   Hemoglobin 13.0 - 17.0 g/dL 10.9  10.7  10.7   Hematocrit 39.0 - 52.0 % 33.5  31.9  31.9   Platelets 150 - 400 K/uL 455  399  410        Latest Ref Rng & Units 01/15/2023    6:16 AM 01/14/2023    4:52 AM 01/13/2023    5:23 AM  CMP  Glucose 70 - 99 mg/dL 96  122  118   BUN 8 - 23 mg/dL 18  19  18    Creatinine 0.61 - 1.24 mg/dL 0.76  0.87  0.96   Sodium 135 - 145 mmol/L 139  139  135   Potassium 3.5 - 5.1 mmol/L 4.1  3.7  3.8   Chloride 98 - 111 mmol/L 104  105  104   CO2 22 - 32 mmol/L 23  25  24    Calcium 8.9 - 10.3 mg/dL 8.9  8.6  8.3       Microbiology: 01/05/23 BC Enterococcus bacteremia 01/07/23 BCNG   Assessment/Plan:  Encephalopathy waxing and waning  likely due to infection medication- Chlorpromazine, oxycodone, Klonipin with underling h/o stroke CT head no acute findings- old infarcts   Sepsis secondary to complicated UTI from obstructive uropathy due to a stone on the left side causing hydronephrosis.  Status post stent placement.   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

## 2023-01-15 NOTE — Progress Notes (Signed)
Left a voicemail for pt's daughter letting her know that the patient is in the same room but in the bed by the window and not the door.

## 2023-01-16 ENCOUNTER — Inpatient Hospital Stay: Payer: Medicare Other

## 2023-01-16 DIAGNOSIS — A419 Sepsis, unspecified organism: Secondary | ICD-10-CM | POA: Diagnosis not present

## 2023-01-16 DIAGNOSIS — R7881 Bacteremia: Secondary | ICD-10-CM | POA: Diagnosis not present

## 2023-01-16 DIAGNOSIS — G9341 Metabolic encephalopathy: Secondary | ICD-10-CM | POA: Diagnosis not present

## 2023-01-16 DIAGNOSIS — I809 Phlebitis and thrombophlebitis of unspecified site: Secondary | ICD-10-CM | POA: Diagnosis not present

## 2023-01-16 DIAGNOSIS — R6520 Severe sepsis without septic shock: Secondary | ICD-10-CM | POA: Diagnosis not present

## 2023-01-16 DIAGNOSIS — N139 Obstructive and reflux uropathy, unspecified: Secondary | ICD-10-CM | POA: Diagnosis not present

## 2023-01-16 LAB — CBC
HCT: 31.5 % — ABNORMAL LOW (ref 39.0–52.0)
Hemoglobin: 10.2 g/dL — ABNORMAL LOW (ref 13.0–17.0)
MCH: 28.8 pg (ref 26.0–34.0)
MCHC: 32.4 g/dL (ref 30.0–36.0)
MCV: 89 fL (ref 80.0–100.0)
Platelets: 482 10*3/uL — ABNORMAL HIGH (ref 150–400)
RBC: 3.54 MIL/uL — ABNORMAL LOW (ref 4.22–5.81)
RDW: 14.8 % (ref 11.5–15.5)
WBC: 11.1 10*3/uL — ABNORMAL HIGH (ref 4.0–10.5)
nRBC: 0 % (ref 0.0–0.2)

## 2023-01-16 LAB — BASIC METABOLIC PANEL
Anion gap: 13 (ref 5–15)
BUN: 21 mg/dL (ref 8–23)
CO2: 22 mmol/L (ref 22–32)
Calcium: 8.7 mg/dL — ABNORMAL LOW (ref 8.9–10.3)
Chloride: 103 mmol/L (ref 98–111)
Creatinine, Ser: 0.78 mg/dL (ref 0.61–1.24)
GFR, Estimated: >60 mL/min (ref >60)
Glucose, Bld: 107 mg/dL — ABNORMAL HIGH (ref 70–99)
Potassium: 3.7 mmol/L (ref 3.5–5.1)
Sodium: 138 mmol/L (ref 135–145)

## 2023-01-16 LAB — PHOSPHORUS: Phosphorus: 4 mg/dL (ref 2.5–4.6)

## 2023-01-16 LAB — MAGNESIUM: Magnesium: 2.1 mg/dL (ref 1.7–2.4)

## 2023-01-16 MED ORDER — ACETAMINOPHEN 500 MG PO TABS
1000.0000 mg | ORAL_TABLET | Freq: Three times a day (TID) | ORAL | Status: DC | PRN
  Administered 2023-01-16 – 2023-01-19 (×7): 1000 mg via ORAL
  Filled 2023-01-16 (×7): qty 2

## 2023-01-16 MED ORDER — QUETIAPINE FUMARATE 25 MG PO TABS
50.0000 mg | ORAL_TABLET | Freq: Every day | ORAL | Status: DC
  Administered 2023-01-16 – 2023-01-18 (×3): 50 mg via ORAL
  Filled 2023-01-16 (×3): qty 2

## 2023-01-16 NOTE — Progress Notes (Signed)
PT Cancellation Note  Patient Details Name: Johnny Rice MRN: [id number] DOB: [birth year]   Cancelled Treatment:     PT attempt. Pt was asleep in long sitting upon arrival. Untouched lunch tray at bedside. He does awake but was unable to stay awake. Encouraged food intake but pt was resistive.  Drifted back to sleep. Acute PT will continue to follow per current POC.    Willette Pa 01/16/2023, 1:35 PM

## 2023-01-16 NOTE — TOC Progression Note (Addendum)
Transition of Care [facility]) - Progression Note    Patient Details  Name: Johnny Rice MRN: [id number] Date of Birth: 21-Aug-1946  [facility]) CM/SW Contact  Ross Ludwig, [place] Phone Number: 01/16/2023, 11:10 AM  Clinical Narrative:    CSW attempted to call [facility] SNF, left a message.  CSW contacted [facility] and spoke to admissions worker Joelene Millin, they can accept patient once he is medically ready for discharge.  Per Joelene Millin, they can accept over weekend if patient is ready.  CSW spoke to patient's daughter [name] and she accepted the bed offer.  CSW sent message to physician to find out when he will be medically ready for discharge.  2:00pm CSW spoke to [place] again, she said because of the holiday the earliest they can accept patient is Monday.  TOC to continue to follow patient's progress throughout discharge planning.       Expected Discharge Plan and Services    SNF for rehab.                                           Social Determinants of Health (SDOH) Interventions SDOH Screenings   Food Insecurity: Patient Unable To Answer (01/05/2023)  Housing: Low Risk  (01/05/2023)  Transportation Needs: Patient Unable To Answer (01/05/2023)  Utilities: Not At Risk (01/05/2023)  Tobacco Use: Low Risk  (01/06/2023)    Readmission Risk Interventions    01/07/2023    4:06 PM  Readmission Risk Prevention Plan  Transportation Screening Complete  PCP or Specialist appointment within 3-5 days of discharge Complete  HRI or [place] Complete  SW Recovery Care/Counseling Consult Complete  [place] Complete

## 2023-01-16 NOTE — Progress Notes (Signed)
  PROGRESS NOTE    Johnny Rice  [id number] DOB: Mar 06, 1946 DOA: 01/05/2023 [facility]CP: [facility]  209A/209A-AA  LOS: 11 days   Brief hospital course:   Assessment & Plan: Johnny Rice is a 77 y.o. male with medical history significant of hypertension, CVA, hyperlipidemia, hypothyroidism, BPH, cognitive impairment presenting with sepsis, obstructive uropathy, encephalopathy.   Severe sepsis: present on admission. Met criteria w/ fever, tachycardia, leukocytosis, AKI, & bacteremia. Blood cxs growing enterococcus. Continue on IV amp as per ID. 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 --cont IV amp till 01/19/23.   Acute metabolic encephalopathy:  --Pt was noted to be drowsy.  Repeat CT head shows no acute intracranial abnormalities.  --it appears that pt was ordered Klonopin 1 mg TID here while home dose was 0.5 mg TID.  Also has been getting opioids pain meds.   --reduce sedating meds. --hold Klonopin --hold home Abilify, start seroquel nightly  Obstructive uropathy:  S/p left ureteral stent placed by Dr. Erlene Quan on 3/18. CT scan showing a 7 mm obstructing stone at the left UVJ with hydronephrosis and perinephric stranding.  Repeat CT abd/pelvis shows the degree of hydronephrosis has improved significantly.  --f/u with urology  Intermittent low-grade fever, resolved --per ID, likely from thrombophlebitis left arm Covid/flu negative CT abdomen hydronephrosis left side better 2 d echo no vegetation   Dementia:  continue w/ supportive care    AKI: resolved    Hx of CVA:  continue on aspirin, statin      HTN:  --cont Lisinopril --Holding BB secondary to bradycardia   BPH:  continue on uroxatral    Hypothyroidism:  continue on liothyronine    Chest discomfort: likely not cardiac. Troponin neg x 2.    Sinus bradycardia:   continue to hold metoprolol    Tongue laceration: continue on viscous lidocaine  Bilateral knee pain --per daughter, this is new --xray of both knees with no acute finding    DVT prophylaxis: Lovenox SQ Code Status: Full code  Family Communication:  Level of care: Med-Surg Dispo:   The patient is from: ALF Anticipated d/c is to: SNF rehab Anticipated d/c date is: to be determined   Subjective and Interval History:  Still very somnolent today.  RN tried to feed pt but he wouldn't eat.  Could not stay awake to work with PT.   Objective: Vitals:   01/15/23 2012 01/16/23 0408 01/16/23 0500 01/16/23 1536  BP: 128/66 138/86  (!) 153/89  Pulse: 100 94  86  Resp: 18 20  18   Temp: 97.8 F (36.6 C) (!) 97.5 F (36.4 C)  (!) 97.3 F (36.3 C)  TempSrc: Oral     SpO2: 96% 96%  97%  Weight:   79.7 kg   Height:        Intake/Output Summary (Last 24 hours) at 01/16/2023 1927 Last data filed at 01/16/2023 1500 Gross per 24 hour  Intake --  Output 500 ml  Net -500 ml   Filed Weights   01/14/23 0500 01/15/23 0500 01/16/23 0500  Weight: 79.7 kg 79.7 kg 79.7 kg    Examination:   Constitutional: NAD, somnolent CV: No cyanosis.   RESP: normal respiratory effort, on RA   Data Reviewed: I have personally reviewed labs and imaging studies  Time spent: 35 minutes  Enzo Bi, MD [facility] If 7PM-7AM, please contact night-coverage 01/16/2023, 7:27 PM

## 2023-01-16 NOTE — Progress Notes (Signed)
Date of Admission:  01/05/2023      ID: Johnny Rice is a 77 y.o. male  Principal Problem:   Severe sepsis ([place]) Active Problems:   BPH (benign prostatic hyperplasia)   History of stroke   Essential hypertension   Complicated UTI (urinary tract infection)   Hypothyroid   Obstructive uropathy   AKI (acute kidney injury) ([place])   Acute metabolic encephalopathy   Tongue laceration   Kidney stone   Sinus bradycardia   Chest discomfort   Bacteremia due to Enterococcus   Thrombophlebitis  Pt more alert today Responding verbally to questions Some restlessness  Medications:   alfuzosin  10 mg Oral q AM   aspirin  81 mg Oral Daily   atorvastatin  20 mg Oral QHS   enoxaparin (LOVENOX) injection  40 mg Subcutaneous Q24H   feeding supplement  237 mL Oral TID BM   liothyronine  25 mcg Oral Daily   lisinopril  40 mg Oral Daily   multivitamin with minerals  1 tablet Oral Daily   polyethylene glycol  17 g Oral QODAY   QUEtiapine  50 mg Oral QHS    Objective: Vital signs in last 24 hours: Patient Vitals for the past 24 hrs:  BP Temp Pulse Resp SpO2 Weight  01/16/23 1536 (!) 153/89 (!) 97.3 F (36.3 C) 86 18 97 % --  01/16/23 0500 -- -- -- -- -- 79.7 kg  01/16/23 0408 138/86 (!) 97.5 F (36.4 C) 94 20 96 % --     O/E more alert than yesterday Responds to questions  Chest b/l air entry Hss1s2 Abd soft Lymph: Cervical, supraclavicular normal. Neurologic: left facial palsy  Left arm at the site of previous Line swelling, erythema and some tenderness is much improved  01/16/23  01/16/23   01/13/23    01/13/23    Left sided weakness Lab Results    Latest Ref Rng & Units 01/16/2023    5:04 AM 01/15/2023    6:16 AM 01/14/2023    4:52 AM  CBC  WBC 4.0 - 10.5 K/uL 11.1  13.1  14.3   Hemoglobin 13.0 - 17.0 g/dL 10.2  10.9  10.7   Hematocrit 39.0 - 52.0 % 31.5  33.5  31.9   Platelets 150 - 400 K/uL 482  455  399        Latest Ref Rng & Units 01/16/2023    5:04 AM  01/15/2023    6:16 AM 01/14/2023    4:52 AM  CMP  Glucose 70 - 99 mg/dL 107  96  122   BUN 8 - 23 mg/dL 21  18  19    Creatinine 0.61 - 1.24 mg/dL 0.78  0.76  0.87   Sodium 135 - 145 mmol/L 138  139  139   Potassium 3.5 - 5.1 mmol/L 3.7  4.1  3.7   Chloride 98 - 111 mmol/L 103  104  105   CO2 22 - 32 mmol/L 22  23  25    Calcium 8.9 - 10.3 mg/dL 8.7  8.9  8.6       Microbiology: 01/05/23 BC Enterococcus bacteremia 01/07/23 BCNG   Assessment/Plan:  Encephalopathy waxing and waning   likely due to  medication- Chlorpromazine, oxycodone, Klonipin with underling infection/ h/o stroke CT head no acute findings- old infarcts   Sepsis secondary to complicated UTI from obstructive uropathy due to a stone on the left side causing hydronephrosis.  Status post stent placement.   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

## 2023-01-17 DIAGNOSIS — A419 Sepsis, unspecified organism: Secondary | ICD-10-CM | POA: Diagnosis not present

## 2023-01-17 DIAGNOSIS — R6520 Severe sepsis without septic shock: Secondary | ICD-10-CM | POA: Diagnosis not present

## 2023-01-17 LAB — CBC
HCT: 31.3 % — ABNORMAL LOW (ref 39.0–52.0)
Hemoglobin: 10.2 g/dL — ABNORMAL LOW (ref 13.0–17.0)
MCH: 28.7 pg (ref 26.0–34.0)
MCHC: 32.6 g/dL (ref 30.0–36.0)
MCV: 88.2 fL (ref 80.0–100.0)
Platelets: 462 10*3/uL — ABNORMAL HIGH (ref 150–400)
RBC: 3.55 MIL/uL — ABNORMAL LOW (ref 4.22–5.81)
RDW: 14.6 % (ref 11.5–15.5)
WBC: 7.1 10*3/uL (ref 4.0–10.5)
nRBC: 0 % (ref 0.0–0.2)

## 2023-01-17 LAB — BASIC METABOLIC PANEL
Anion gap: 13 (ref 5–15)
BUN: 20 mg/dL (ref 8–23)
CO2: 23 mmol/L (ref 22–32)
Calcium: 8.9 mg/dL (ref 8.9–10.3)
Chloride: 101 mmol/L (ref 98–111)
Creatinine, Ser: 0.72 mg/dL (ref 0.61–1.24)
GFR, Estimated: >60 mL/min (ref >60)
Glucose, Bld: 81 mg/dL (ref 70–99)
Potassium: 3.5 mmol/L (ref 3.5–5.1)
Sodium: 137 mmol/L (ref 135–145)

## 2023-01-17 LAB — PHOSPHORUS: Phosphorus: 4.4 mg/dL (ref 2.5–4.6)

## 2023-01-17 LAB — MAGNESIUM: Magnesium: 2.1 mg/dL (ref 1.7–2.4)

## 2023-01-17 MED ORDER — IBUPROFEN 400 MG PO TABS
400.0000 mg | ORAL_TABLET | Freq: Four times a day (QID) | ORAL | Status: DC | PRN
  Administered 2023-01-17 – 2023-01-18 (×2): 400 mg via ORAL
  Filled 2023-01-17 (×2): qty 1

## 2023-01-17 NOTE — Progress Notes (Signed)
PROGRESS NOTE    Johnny Rice  [id number] DOB: 1946-03-05 DOA: 01/05/2023 [facility]CP: [facility]  209A/209A-AA  LOS: 12 days   Brief hospital course:   Assessment & Plan: Johnny Rice is a 77 y.o. male with medical history significant of hypertension, CVA, hyperlipidemia, hypothyroidism, BPH, cognitive impairment presenting with sepsis, obstructive uropathy, encephalopathy.   Severe sepsis: present on admission. Met criteria w/ fever, tachycardia, leukocytosis, AKI, & bacteremia. Blood cxs growing enterococcus. Continue on IV amp as per ID. 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 --cont IV amp till 01/19/23.   Acute metabolic encephalopathy:  --Pt was noted to be drowsy.  Repeat CT head shows no acute intracranial abnormalities.  --it appears that pt was ordered Klonopin 1 mg TID here while home dose was 0.5 mg TID.  Also has been getting opioids pain meds.  Klonopin tapered down and d/c'ed on 3/29.  Started seroquel on 3/29. --reduce sedating meds. --hold Klonopin --hold home Abilify --cont seroquel (new)  Obstructive uropathy:  S/p left ureteral stent placed by Dr. Erlene Quan on 3/18. CT scan showing a 7 mm obstructing stone at the left UVJ with hydronephrosis and perinephric stranding.  Repeat CT abd/pelvis shows the degree of hydronephrosis has improved significantly.  --f/u with urology  Intermittent low-grade fever, resolved --per ID, likely from thrombophlebitis left arm Covid/flu negative CT abdomen hydronephrosis left side better 2 d echo no vegetation   Dementia:  continue w/ supportive care    AKI: resolved    Hx of CVA:  continue on aspirin, statin      HTN:  --cont Lisinopril --Holding BB secondary to bradycardia   BPH:  continue on uroxatral    Hypothyroidism:  continue on liothyronine    Chest  discomfort: likely not cardiac. Troponin neg x 2.    Sinus bradycardia:  continue to hold metoprolol    Tongue laceration: continue on viscous lidocaine  Bilateral knee pain --per daughter, this is new --xray of both knees with no acute finding --tylenol and Advil PRN  Back pain --tylenol and Advil PRN   DVT prophylaxis: Lovenox SQ Code Status: Full code  Family Communication:  Level of care: Med-Surg Dispo:   The patient is from: ALF Anticipated d/c is to: SNF rehab Anticipated d/c date is: Monday  Subjective and Interval History:  Home klonopin held yesterday, pt appeared to be more awake today.  RN reported pt fed himself some breakfast.  Complained of back pain to RN.   Objective: Vitals:   01/17/23 0306 01/17/23 0331 01/17/23 0932 01/17/23 1624  BP:  (!) 149/93 (!) 148/77 (!) 148/86  Pulse:  78 (!) 109 83  Resp:  18 18 18   Temp:  98.8 F (37.1 C) 97.7 F (36.5 C) 98 F (36.7 C)  TempSrc:  Oral Oral Oral  SpO2:  98% 100% 96%  Weight: 80.5 kg     Height:        Intake/Output Summary (Last 24 hours) at 01/17/2023 1920 Last data filed at 01/17/2023 1044 Gross per 24 hour  Intake 1328.08 ml  Output 1800 ml  Net -471.92 ml   Filed Weights   01/15/23 0500 01/16/23 0500 01/17/23 0306  Weight: 79.7 kg 79.7 kg 80.5 kg    Examination:   Constitutional: NAD, alert, not oriented CV: No cyanosis.   RESP: normal respiratory effort, on RA SKIN: warm, dry   Data Reviewed: I  have personally reviewed labs and imaging studies  Time spent: 35 minutes  Enzo Bi, MD [facility] If 7PM-7AM, please contact night-coverage 01/17/2023, 7:20 PM

## 2023-01-17 NOTE — Progress Notes (Signed)
Occupational Therapy Treatment Patient Details Name: Johnny Rice MRN: [id number] DOB: 1945/11/08 Today's Date: 01/17/2023   History of present illness presented to ER secondary to fever, nausea/vomiting; admitted for sepsis, encephalopathy related to obstructive uropathy.   OT comments  Mr Saunders was seen for OT treatment on this date. Upon arrival to room pt reclined in bed, agreeable to tx. Pt requires MOD A exit bed, fair sitting balance. Requesting to trial standing, clears rear with MAX A + RW, does not achieve full standing. Bed noted to be wet, returned to supine MOD A for BLE. MAX A rolling bed level for toileting. Pt making good progress toward goals, will continue to follow POC. Discharge recommendation remains appropriate.     Recommendations for follow up therapy are one component of a multi-disciplinary discharge planning process, led by the attending physician.  Recommendations may be updated based on patient status, additional functional criteria and insurance authorization.    Assistance Recommended at Discharge Frequent or constant Supervision/Assistance  Patient can return home with the following  Two people to help with walking and/or transfers;Two people to help with bathing/dressing/bathroom;Direct supervision/assist for financial management;Direct supervision/assist for medications management;Assistance with cooking/housework;Assist for transportation;Help with stairs or ramp for entrance   Equipment Recommendations  Other (comment) (defer)    Recommendations for Other Services      Precautions / Restrictions Precautions Precautions: Fall Restrictions Weight Bearing Restrictions: No       Mobility Bed Mobility Overal bed mobility: Needs Assistance Bed Mobility: Rolling, Supine to Sit, Sit to Supine Rolling: Max assist   Supine to sit: Mod assist Sit to supine: Max assist   General bed mobility comments: sup<>long sit MOD A    Transfers Overall transfer  level: Needs assistance Equipment used: Rolling walker (2 wheels) Transfers: Sit to/from Stand Sit to Stand: Max assist, From elevated surface           General transfer comment: clears rear     Balance Overall balance assessment: Needs assistance Sitting-balance support: Feet supported, No upper extremity supported Sitting balance-Leahy Scale: Fair     Standing balance support: Reliant on assistive device for balance Standing balance-Leahy Scale: Poor                             ADL either performed or assessed with clinical judgement   ADL Overall ADL's : Needs assistance/impaired                                       General ADL Comments: MAX A rolling bed level for toileting. MIN A grooming seated EOB      Cognition Arousal/Alertness: Awake/alert Behavior During Therapy: Flat affect Overall Cognitive Status: No family/caregiver present to determine baseline cognitive functioning                                 General Comments: fair insight into needs                   Pertinent Vitals/ Pain       Pain Assessment Pain Assessment: Faces Faces Pain Scale: Hurts even more Pain Location: back Pain Descriptors / Indicators: Discomfort, Dull, Aching Pain Intervention(s): Limited activity within patient's tolerance, Repositioned   Frequency  Min 2X/week        Progress  Toward Goals  OT Goals(current goals can now be found in the care plan section)  Progress towards OT goals: Progressing toward goals  Acute Rehab OT Goals Patient Stated Goal: to stand OT Goal Formulation: With patient Time For Goal Achievement: 01/20/23 Potential to Achieve Goals: Fair ADL Goals Pt Will Perform Lower Body Dressing: with modified independence;sit to/from stand Pt Will Transfer to Toilet: with modified independence;ambulating Pt Will Perform Toileting - Clothing Manipulation and hygiene: with modified independence;sit  to/from stand  Plan Discharge plan remains appropriate;Frequency remains appropriate    Co-evaluation                 AM-PAC OT "6 Clicks" Daily Activity     Outcome Measure   Help from another person eating meals?: A Little Help from another person taking care of personal grooming?: A Little Help from another person toileting, which includes using toliet, bedpan, or urinal?: A Lot Help from another person bathing (including washing, rinsing, drying)?: A Lot Help from another person to put on and taking off regular upper body clothing?: A Little Help from another person to put on and taking off regular lower body clothing?: A Lot 6 Click Score: 15    End of Session    OT Visit Diagnosis: Other abnormalities of gait and mobility (R26.89);Muscle weakness (generalized) (M62.81)   Activity Tolerance Patient tolerated treatment well   Patient Left in bed;with call bell/phone within reach;with bed alarm set   Nurse Communication          Time: [id number] OT Time Calculation (min): 26 min  Charges: OT General Charges $OT Visit: 1 Visit OT Treatments $Self Care/Home Management : 23-37 mins   Dessie Coma, M.S. OTR/L  01/17/23, 12:48 PM  ascom [phone]

## 2023-01-18 DIAGNOSIS — R6520 Severe sepsis without septic shock: Secondary | ICD-10-CM | POA: Diagnosis not present

## 2023-01-18 DIAGNOSIS — A419 Sepsis, unspecified organism: Secondary | ICD-10-CM | POA: Diagnosis not present

## 2023-01-18 NOTE — Progress Notes (Signed)
PROGRESS NOTE    Johnny Rice  [id number] DOB: [birth year] DOA: 01/05/2023 PCP: [facility]  209A/209A-AA  LOS: 13 days   Brief hospital course:   Assessment & Plan: Johnny Rice is a 77 y.o. male with medical history significant of hypertension, CVA, hyperlipidemia, hypothyroidism, BPH, cognitive impairment presenting with sepsis, obstructive uropathy, encephalopathy.   Severe sepsis: present on admission. Met criteria w/ fever, tachycardia, leukocytosis, AKI, & bacteremia. Blood cxs growing enterococcus. Continue on IV amp as per ID. 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 --cont IV amp till 01/19/23.   Acute metabolic encephalopathy:  --Pt was noted to be drowsy.  Repeat CT head shows no acute intracranial abnormalities.  --it appears that pt was ordered Klonopin 1 mg TID here while home dose was 0.5 mg TID.  Also has been getting opioids pain meds.  Klonopin tapered down and d/c'ed on 3/29.  Started seroquel on 3/29. --reduce sedating meds. --hold Klonopin --hold home Abilify --cont seroquel (new)  Obstructive uropathy:  S/p left ureteral stent placed by Dr. Erlene Quan on 3/18. CT scan showing a 7 mm obstructing stone at the left UVJ with hydronephrosis and perinephric stranding.  Repeat CT abd/pelvis shows the degree of hydronephrosis has improved significantly.  --f/u with urology  Intermittent low-grade fever, resolved --per ID, likely from thrombophlebitis left arm Covid/flu negative CT abdomen hydronephrosis left side better 2 d echo no vegetation   Dementia:  continue w/ supportive care    AKI: resolved    Hx of CVA:  continue on aspirin, statin      HTN:  --cont Lisinopril --Holding BB secondary to bradycardia   BPH:  continue on uroxatral    Hypothyroidism:  continue on liothyronine    Chest  discomfort: likely not cardiac. Troponin neg x 2.    Sinus bradycardia:  continue to hold metoprolol    Tongue laceration: continue on viscous lidocaine  Bilateral knee pain --per daughter, this is new --xray of both knees with no acute finding --tylenol and Advil PRN  Back pain --tylenol and Advil PRN   DVT prophylaxis: Lovenox SQ Code Status: Full code  Family Communication:  Level of care: Med-Surg Dispo:   The patient is from: ALF Anticipated d/c is to: SNF rehab Anticipated d/c date is: Monday  Subjective and Interval History:  Ate a bit of breakfast today.   Objective: Vitals:   01/18/23 0413 01/18/23 0500 01/18/23 0801 01/18/23 1540  BP: (!) 158/93  122/81 (!) 148/95  Pulse: 87  (!) 110 77  Resp: 18  18 18   Temp: 97.7 F (36.5 C)  98.1 F (36.7 C) 97.7 F (36.5 C)  TempSrc: Oral  Oral Oral  SpO2: 96%  95% 96%  Weight:  78.5 kg    Height:        Intake/Output Summary (Last 24 hours) at 01/18/2023 1749 Last data filed at 01/18/2023 1441 Gross per 24 hour  Intake 120 ml  Output 1151 ml  Net -1031 ml   Filed Weights   01/16/23 0500 01/17/23 0306 01/18/23 0500  Weight: 79.7 kg 80.5 kg 78.5 kg    Examination:   Constitutional: NAD, somnolent CV: No cyanosis.   RESP: normal respiratory effort, on RA SKIN: warm, dry   Data Reviewed: I have personally reviewed labs and imaging studies  Time spent: 25 minutes  Enzo Bi, MD [facility] If 7PM-7AM, please contact night-coverage  01/18/2023, 5:49 PM

## 2023-01-18 NOTE — Progress Notes (Signed)
OT Cancellation Note  Patient Details Name: Johnny Rice MRN: [id number] DOB: 11-Mar-1946   Cancelled Treatment:    Reason Eval/Treat Not Completed: Other (comment). Several family members present in room and requested OT come back tomorrow. Will re-attempt at later date/time.   Doneta Public 01/18/2023, 4:21 PM

## 2023-01-18 NOTE — Progress Notes (Signed)
Physical Therapy Treatment Patient Details Name: Johnny Rice MRN: [id number] DOB: [birth year] Today's Date: 01/18/2023   History of Present Illness presented to ER secondary to fever, nausea/vomiting; admitted for sepsis, encephalopathy related to obstructive uropathy.    PT Comments    Pt in bed.  Awake stating he needs to use bathroom for BM.  Orientated to place and time.  He is able to get to EOB with min a x 1 to today and good effort using rails.  Generally steady in sitting with right lean and cues to sit upright.  He does attempt to stand on his own but is unable.  Stand pivot to and from [facility] with mod a x 1.  He is unable to take any true steps but is guided by Probation officer and does do quite well.  Once on [facility] he stated he does not need a BM anymore and that he is "too tired" to try"  max a x 1 to reposition in bed but he does attempt to help with cues.    Overall improvement in mobility today but remains quite limited.   Recommendations for follow up therapy are one component of a multi-disciplinary discharge planning process, led by the attending physician.  Recommendations may be updated based on patient status, additional functional criteria and insurance authorization.  Follow Up Recommendations  Can patient physically be transported by private vehicle: No    Assistance Recommended at Discharge Frequent or constant Supervision/Assistance  Patient can return home with the following Two people to help with walking and/or transfers;Two people to help with bathing/dressing/bathroom;Assist for transportation;Help with stairs or ramp for entrance;Assistance with cooking/housework   Equipment Recommendations  None recommended by PT    Recommendations for Other Services       Precautions / Restrictions Precautions Precautions: Fall Precaution Comments: Prior CVA with L side weakness Restrictions Weight Bearing Restrictions: No     Mobility  Bed Mobility Overal bed mobility:  Needs Assistance Bed Mobility: Supine to Sit, Sit to Supine     Supine to sit: Min assist Sit to supine: Mod assist   General bed mobility comments: does well motivated to get to [facility]    Transfers Overall transfer level: Needs assistance Equipment used: None Transfers: Sit to/from Stand, Bed to chair/wheelchair/BSC Sit to Stand: Mod assist Stand pivot transfers: Mod assist         General transfer comment: able to stand well today.  difficulty with steps but is able to be safely guided by writer to and from [facility]    Ambulation/Gait               General Gait Details: unsafe/unable   Marine scientist Rankin (Stroke Patients Only)       Balance Overall balance assessment: Needs assistance Sitting-balance support: Feet supported, No upper extremity supported Sitting balance-Leahy Scale: Fair     Standing balance support: Bilateral upper extremity supported Standing balance-Leahy Scale: Poor Standing balance comment: Leans to the R.                            Cognition Arousal/Alertness: Awake/alert Behavior During Therapy: WFL for tasks assessed/performed Overall Cognitive Status: No family/caregiver present to determine baseline cognitive functioning  General Comments: fair insight into needs        Exercises      General Comments        Pertinent Vitals/Pain Pain Assessment Pain Assessment: No/denies pain    Home Living                          Prior Function            PT Goals (current goals can now be found in the care plan section) Progress towards PT goals: Progressing toward goals    Frequency    Min 2X/week      PT Plan Current plan remains appropriate    Co-evaluation              AM-PAC PT "6 Clicks" Mobility   Outcome Measure  Help needed turning from your back to your side while in a flat bed without  using bedrails?: A Lot Help needed moving from lying on your back to sitting on the side of a flat bed without using bedrails?: A Lot Help needed moving to and from a bed to a chair (including a wheelchair)?: A Lot Help needed standing up from a chair using your arms (e.g., wheelchair or bedside chair)?: A Lot Help needed to walk in hospital room?: Total Help needed climbing 3-5 steps with a railing? : Total 6 Click Score: 10    End of Session   Activity Tolerance: Patient limited by fatigue Patient left: in bed;with call bell/phone within reach;with bed alarm set Nurse Communication: Mobility status PT Visit Diagnosis: Muscle weakness (generalized) (M62.81);Other abnormalities of gait and mobility (R26.89);Pain     Time: [id number] PT Time Calculation (min) (ACUTE ONLY): 11 min  Charges:  $Therapeutic Activity: 8-22 mins                   Chesley Noon, PTA 01/18/23, 9:51 AM

## 2023-01-19 ENCOUNTER — Telehealth: Payer: Self-pay | Admitting: Physician Assistant

## 2023-01-19 DIAGNOSIS — R6520 Severe sepsis without septic shock: Secondary | ICD-10-CM | POA: Diagnosis not present

## 2023-01-19 DIAGNOSIS — A419 Sepsis, unspecified organism: Secondary | ICD-10-CM | POA: Diagnosis not present

## 2023-01-19 LAB — MAGNESIUM: Magnesium: 1.9 mg/dL (ref 1.7–2.4)

## 2023-01-19 LAB — BASIC METABOLIC PANEL
Anion gap: 10 (ref 5–15)
BUN: 14 mg/dL (ref 8–23)
CO2: 25 mmol/L (ref 22–32)
Calcium: 9.1 mg/dL (ref 8.9–10.3)
Chloride: 106 mmol/L (ref 98–111)
Creatinine, Ser: 0.77 mg/dL (ref 0.61–1.24)
GFR, Estimated: >60 mL/min (ref >60)
Glucose, Bld: 114 mg/dL — ABNORMAL HIGH (ref 70–99)
Potassium: 3.1 mmol/L — ABNORMAL LOW (ref 3.5–5.1)
Sodium: 141 mmol/L (ref 135–145)

## 2023-01-19 LAB — CBC
HCT: 32.9 % — ABNORMAL LOW (ref 39.0–52.0)
Hemoglobin: 10.7 g/dL — ABNORMAL LOW (ref 13.0–17.0)
MCH: 28.4 pg (ref 26.0–34.0)
MCHC: 32.5 g/dL (ref 30.0–36.0)
MCV: 87.3 fL (ref 80.0–100.0)
Platelets: 558 10*3/uL — ABNORMAL HIGH (ref 150–400)
RBC: 3.77 MIL/uL — ABNORMAL LOW (ref 4.22–5.81)
RDW: 14.5 % (ref 11.5–15.5)
WBC: 6.7 10*3/uL (ref 4.0–10.5)
nRBC: 0 % (ref 0.0–0.2)

## 2023-01-19 LAB — PHOSPHORUS: Phosphorus: 3.8 mg/dL (ref 2.5–4.6)

## 2023-01-19 MED ORDER — METOPROLOL TARTRATE 25 MG PO TABS
25.0000 mg | ORAL_TABLET | Freq: Two times a day (BID) | ORAL | Status: DC
  Administered 2023-01-19: 25 mg via ORAL
  Filled 2023-01-19: qty 1

## 2023-01-19 MED ORDER — POTASSIUM CHLORIDE CRYS ER 20 MEQ PO TBCR
40.0000 meq | EXTENDED_RELEASE_TABLET | Freq: Once | ORAL | Status: AC
  Administered 2023-01-19: 40 meq via ORAL
  Filled 2023-01-19: qty 2

## 2023-01-19 MED ORDER — ENSURE ENLIVE PO LIQD: 237.0000 mL | Freq: Three times a day (TID) | ORAL | Status: AC

## 2023-01-19 MED ORDER — ACETAMINOPHEN 500 MG PO TABS: 1000.0000 mg | ORAL_TABLET | Freq: Three times a day (TID) | ORAL | 0 refills | Status: AC | PRN

## 2023-01-19 MED ORDER — CLONAZEPAM 0.5 MG PO TABS: 0.5000 mg | ORAL_TABLET | Freq: Two times a day (BID) | ORAL | 0 refills | Status: AC | PRN

## 2023-01-19 MED ORDER — ADULT MULTIVITAMIN W/MINERALS CH: 1.0000 | ORAL_TABLET | Freq: Every day | ORAL | Status: AC

## 2023-01-19 NOTE — Telephone Encounter (Signed)
Pt daughter, [name] called returned your call when to schedule procedure for the kidney stone. Please advise Myra [phone]

## 2023-01-19 NOTE — Progress Notes (Signed)
Occupational Therapy Treatment Patient Details Name: Johnny Rice MRN: [id number] DOB: 1945/11/27 Today's Date: 01/19/2023   History of present illness presented to ER secondary to fever, nausea/vomiting; admitted for sepsis, encephalopathy related to obstructive uropathy.   OT comments  Upon entering the room, pt supine in bed and agreeable to OT intervention with encouragement. Pt reports, " I can't standing" but willing to try with therapy. Pt performs bed mobility with min A for trunk support. Pt requesting coffee after therapy session is completed. Static sitting balance with R lateral lean requiring cuing for upright positioning but no physical assistance for seated balance. Pt stands with mod lifting assistance from standard bed height with use of RW. Pt takes several side steps to the R with min A. Pt declined further OT intervention and hyperfocused on getting coffee. Chair alarm set and call bell within reach upon exiting the room.    Recommendations for follow up therapy are one component of a multi-disciplinary discharge planning process, led by the attending physician.  Recommendations may be updated based on patient status, additional functional criteria and insurance authorization.    Assistance Recommended at Discharge Frequent or constant Supervision/Assistance  Patient can return home with the following  Direct supervision/assist for financial management;Direct supervision/assist for medications management;Assistance with cooking/housework;Assist for transportation;Help with stairs or ramp for entrance;A lot of help with walking and/or transfers;A lot of help with bathing/dressing/bathroom   Equipment Recommendations  Other (comment) (defer to next venue of care)    Recommendations for Other Services      Precautions / Restrictions Precautions Precautions: Fall Precaution Comments: Prior CVA with L side weakness       Mobility Bed Mobility Overal bed mobility: Needs  Assistance Bed Mobility: Supine to Sit     Supine to sit: Min assist     General bed mobility comments: min A for trunk support to EOB    Transfers Overall transfer level: Needs assistance Equipment used: 1 person hand held assist, Rolling walker (2 wheels) Transfers: Sit to/from Stand, Bed to chair/wheelchair/BSC Sit to Stand: Mod assist Stand pivot transfers: Min assist         General transfer comment: Pt does well with RW but needs encouragement and cuing for safety and technique     Balance Overall balance assessment: Needs assistance Sitting-balance support: Feet supported, No upper extremity supported Sitting balance-Leahy Scale: Fair Sitting balance - Comments: supervision with cuing for upright position Postural control: Right lateral lean                                 ADL either performed or assessed with clinical judgement   ADL                                              Extremity/Trunk Assessment Upper Extremity Assessment Upper Extremity Assessment: Generalized weakness   Lower Extremity Assessment Lower Extremity Assessment: Generalized weakness        Vision Patient Visual Report: No change from baseline            Cognition Arousal/Alertness: Awake/alert Behavior During Therapy: WFL for tasks assessed/performed Overall Cognitive Status: No family/caregiver present to determine baseline cognitive functioning  General Comments: follows 1 step commands with increased time                   Pertinent Vitals/ Pain       Pain Assessment Pain Assessment: No/denies pain         Frequency  Min 1X/week        Progress Toward Goals  OT Goals(current goals can now be found in the care plan section)  Progress towards OT goals: Progressing toward goals     Plan Discharge plan remains appropriate;Frequency needs to be updated       AM-PAC OT "6  Clicks" Daily Activity     Outcome Measure   Help from another person eating meals?: A Little Help from another person taking care of personal grooming?: A Little Help from another person toileting, which includes using toliet, bedpan, or urinal?: A Lot Help from another person bathing (including washing, rinsing, drying)?: A Lot Help from another person to put on and taking off regular upper body clothing?: A Little Help from another person to put on and taking off regular lower body clothing?: A Lot 6 Click Score: 15    End of Session Equipment Utilized During Treatment: Rolling walker (2 wheels)  OT Visit Diagnosis: Other abnormalities of gait and mobility (R26.89);Muscle weakness (generalized) (M62.81)   Activity Tolerance Patient tolerated treatment well   Patient Left with call bell/phone within reach;in chair;with chair alarm set   Nurse Communication Mobility status;Other (comment) (pt requests tylenol and coffee)        Time: [id number] OT Time Calculation (min): 21 min  Charges: OT General Charges $OT Visit: 1 Visit OT Treatments $Therapeutic Activity: 8-22 mins  Darleen Crocker, MS, OTR/L , CBIS ascom [phone]  01/19/23, 11:08 AM

## 2023-01-19 NOTE — Progress Notes (Signed)
Physical Therapy Treatment Patient Details Name: Johnny Rice MRN: [id number] DOB: [birth year] Today's Date: 01/19/2023   History of Present Illness presented to ER secondary to fever, nausea/vomiting; admitted for sepsis, encephalopathy related to obstructive uropathy.    PT Comments    Pt seen for PT tx with pt agreeable, mobility specialist present to assist/provide chair follow. Pt is able to complete STS with min assist +2 with cuing for hand placement but poor return demo & is able to ambulate a few steps in the room with RW & min assist + chair follow. Pt continues to make progress but requires significant encouragement throughout session. Continue to recommend ongoing PT services to address ongoing deficits.    Recommendations for follow up therapy are one component of a multi-disciplinary discharge planning process, led by the attending physician.  Recommendations may be updated based on patient status, additional functional criteria and insurance authorization.  Follow Up Recommendations  Can patient physically be transported by private vehicle: No    Assistance Recommended at Discharge Frequent or constant Supervision/Assistance  Patient can return home with the following Assist for transportation;Help with stairs or ramp for entrance;Assistance with cooking/housework;A lot of help with walking and/or transfers;A lot of help with bathing/dressing/bathroom   Equipment Recommendations  None recommended by PT    Recommendations for Other Services       Precautions / Restrictions Precautions Precautions: Fall Precaution Comments: Prior CVA with L side weakness Restrictions Weight Bearing Restrictions: No     Mobility  Bed Mobility               General bed mobility comments: not tested, pt received & left in recliner    Transfers Overall transfer level: Needs assistance Equipment used: Rolling walker (2 wheels) Transfers: Sit to/from Stand Sit to Stand: Min  assist, +2 physical assistance Stand pivot transfers: Min assist (recliner<>BSC with RW)         General transfer comment: STS from recliner & BSC with min assist +2, cuing for hand placement    Ambulation/Gait Ambulation/Gait assistance: Min assist, +2 safety/equipment Gait Distance (Feet): 4 Feet (+ 6 ft) Assistive device: Rolling walker (2 wheels) Gait Pattern/deviations: Decreased step length - right, Decreased step length - left, Decreased stride length, Decreased dorsiflexion - right, Decreased dorsiflexion - left Gait velocity: decreased     General Gait Details: Encouragement to continue ambulating   Stairs             Wheelchair Mobility    Modified Rankin (Stroke Patients Only)       Balance Overall balance assessment: Needs assistance Sitting-balance support: Feet supported, No upper extremity supported Sitting balance-Leahy Scale: Fair     Standing balance support: Bilateral upper extremity supported, During functional activity, Reliant on assistive device for balance Standing balance-Leahy Scale: Poor                              Cognition Arousal/Alertness: Awake/alert Behavior During Therapy: WFL for tasks assessed/performed Overall Cognitive Status: No family/caregiver present to determine baseline cognitive functioning                                 General Comments: Pt follows 1 step commands with extra time.        Exercises      General Comments General comments (skin integrity, edema, etc.): Pt with incontinent BM & continent BM  on [facility], requires total assist for peri hygiene & changing into a clean gown.      Pertinent Vitals/Pain Pain Assessment Pain Assessment: No/denies pain    Home Living                          Prior Function            PT Goals (current goals can now be found in the care plan section) Acute Rehab PT Goals Patient Stated Goal: patient unable to state PT Goal  Formulation: Patient unable to participate in goal setting Time For Goal Achievement: 01/20/23 Potential to Achieve Goals: Fair Progress towards PT goals: Progressing toward goals    Frequency    Min 2X/week      PT Plan Current plan remains appropriate    Co-evaluation              AM-PAC PT "6 Clicks" Mobility   Outcome Measure  Help needed turning from your back to your side while in a flat bed without using bedrails?: A Lot Help needed moving from lying on your back to sitting on the side of a flat bed without using bedrails?: A Lot Help needed moving to and from a bed to a chair (including a wheelchair)?: A Lot Help needed standing up from a chair using your arms (e.g., wheelchair or bedside chair)?: A Lot Help needed to walk in hospital room?: A Lot Help needed climbing 3-5 steps with a railing? : Total 6 Click Score: 11    End of Session   Activity Tolerance: Patient limited by fatigue;Patient tolerated treatment well Patient left: in chair;with chair alarm set;with call bell/phone within reach   PT Visit Diagnosis: Muscle weakness (generalized) (M62.81);Other abnormalities of gait and mobility (R26.89);Pain     Time: [id number] PT Time Calculation (min) (ACUTE ONLY): 24 min  Charges:  $Therapeutic Activity: 23-37 mins                     Lavone Nian, PT, DPT 01/19/23, 11:30 AM   Waunita Schooner 01/19/2023, 11:27 AM

## 2023-01-19 NOTE — Progress Notes (Signed)
Brief Urology Progress Note  I have not yet heard back from Mr. Noguez daughter regarding desired definitive stone management. I LMOM this morning asking her to return my call so we can plan accordingly.  Debroah Loop, PA-C 01/19/23 9:42 AM

## 2023-01-19 NOTE — TOC Transition Note (Signed)
Transition of Care [facility]) - CM/SW Discharge Note   Patient Details  Name: Johnny Rice MRN: [id number] Date of Birth: [birth year]  [facility] CM/SW Contact:  Beverly Sessions, RN Phone Number: 01/19/2023, 10:55 AM   Clinical Narrative:     Patient will DC to:[facility] Anticipated DC date:01/19/23  Family notified: Daughter [employer] [phone]:[id number]  Per MD patient ready for DC to . RN, patient's family, and facility notified of DC. Discharge Summary sent to facility. RN given number for report. DC packet on chart. Ambulance transport requested for patient.  TOC signing off.  Isaias Cowman [facility] [phone]         Patient Goals and CMS Choice      Discharge Placement                         Discharge Plan and Services Additional resources added to the After Visit Summary for                                       Social Determinants of Health (SDOH) Interventions SDOH Screenings   Food Insecurity: Patient Unable To Answer (01/05/2023)  Housing: Low Risk  (01/05/2023)  Transportation Needs: Patient Unable To Answer (01/05/2023)  Utilities: Not At Risk (01/05/2023)  Tobacco Use: Low Risk  (01/06/2023)     Readmission Risk Interventions    01/07/2023    4:06 PM  Readmission Risk Prevention Plan  Transportation Screening Complete  PCP or Specialist appointment within 3-5 days of discharge Complete  HRI or [place] Complete  SW Recovery Care/Counseling Consult Complete  [place] Complete

## 2023-01-19 NOTE — Discharge Summary (Signed)
Physician Discharge Summary   Johnny Rice  male DOB: April 13, 1946  [id number]  PCP: [facility]  Admit date: 01/05/2023 Discharge date: 01/19/2023  Admitted From: ALF Disposition:  SNF rehab Daughter updated on the phone prior to discharge. CODE STATUS: Full code   Hospital Course:  For full details, please see H&P, progress notes, consult notes and ancillary notes.  Briefly,  Johnny Rice is a 77 y.o. male with medical history significant of hypertension, prior CVA with left-sided weakness, Left sided facial palsy residual, Left eye blindness, hypothyroidism, BPH, anxiety on chronic benzo, cognitive impairment presenting from ALF with fever, N/V.  Presented to the ER Tmax of 104.  CT abdomen pelvis showed 7 mm obstructing stone at the left UPJ with upstream hydronephrosis and perinephric stranding with concern for superimposed infection.  Dr. Erlene Quan with urology consulted and placed ureteral stent.  Blood cultures positive for Enterococcus.   Severe sepsis: present on admission. Met criteria w/ fever, tachycardia, leukocytosis, AKI, & bacteremia. Blood cxs growing enterococcus.  Urine cx no growth.   Enterococcus bacteremia Presumed 2/2 Enterococcus urinary tract infection, however, urine cx neg. Patient chronically colonized with Enterococcus since 2021 --ID consulted. --Pt received IV amp till 01/19/23, which completed 14-day course of abx treatment.   Acute metabolic and toxic encephalopathy:  Likely hospital hypoactive delirium --Pt was noted to be drowsy.  Repeat CT head shows no acute intracranial abnormalities.  MRI brain on 3/29 found no acute finding.  Somnolence contributed by sepsis, overuse of sedatives and hospital delirium.   --it appears that pt was ordered Klonopin 1 mg TID here (as documented on admission home med rec) while home dose was 0.5 mg TID (per PDMP).  Also has been getting opioids pain meds which was subsequently d/c'ed.  Klonopin tapered  down and d/c'ed on 3/29, and pt did become more awake and able to feed himself during meals.   --Started seroquel nightly on 3/29 to help with delirium and held home Abilify while on Seroquel.   --at discharge, pt was resumed on home Abilify, seroquel not continued, and Klonopin ordered as 0.5 mg BID PRN.   Obstructive uropathy:  S/p left ureteral stent placed by Dr. Erlene Quan on 3/18. Initial CT scan showing a 7 mm obstructing stone at the left UVJ with hydronephrosis and perinephric stranding.  Repeat CT abd/pelvis showed the degree of hydronephrosis has improved significantly.  --outpatient f/u with urology   Intermittent low-grade fever, resolved --per ID, likely from thrombophlebitis left arm.  2d echo no vegetation.  Fever resolved as thrombophlebitis improved.   Dementia:  --Increased risk of delirium.   I have attempted to wean pt off Klonopin, however, daughter was very concerned about pt going back into his anxious state during which pt refuses to get out of bed, so Klonopin continued at reduced 0.5 mg BID PRN.  At risk for poor nutrition --Pt can feed himself after meals are set up, however, eats little.  Does drink at least 2 Ensures per day.  Option of tube feeding discussed with daughter, currently no decision has been made. --cont Ensure TID and supplements.   AKI: resolved  --Cr 1.64 on presentation, and already improved the next day to 1.1, and down to 0.77 prior to discharge.   Hx of CVA:  continue on aspirin, statin      HTN:  --cont home Lisinopril --home Lopressor held during hospitalization due to bradycardia, however, HR started to trend up prior to discharge, so Lopressor resumed.  BPH:  continue on uroxatral    Hypothyroidism:  continue on liothyronine    Chest discomfort: likely not cardiac. Troponin neg x 2.    Sinus bradycardia:  --home Lopressor held during hospitalization due to bradycardia, however, HR started to trend up prior to discharge, so  Lopressor resumed.   Tongue laceration:  continue on viscous lidocaine PRN   Bilateral knee pain --per daughter, this is new --xray of left knee showed "Tricompartment degenerative changes greatest of the medial compartment with chondrocalcinosis and tiny effusion"   --xray of right knee showed "No significant knee joint degenerative changes. No joint effusion." --Tylenol 1g TID PRN.   Chronic Back pain --Tylenol 1g TID PRN.   Discharge Diagnoses:  Principal Problem:   Severe sepsis Active Problems:   Complicated UTI (urinary tract infection)   Obstructive uropathy   Acute metabolic encephalopathy   AKI (acute kidney injury)   History of stroke   BPH (benign prostatic hyperplasia)   Essential hypertension   Hypothyroid   Tongue laceration   Kidney stone   Sinus bradycardia   Chest discomfort   Bacteremia due to Enterococcus   Thrombophlebitis   30 Day Unplanned Readmission Risk Score    Flowsheet Row ED to Hosp-Admission (Current) from 01/05/2023 in [place]  30 Day Unplanned Readmission Risk Score (%) 21.52 Filed at 01/19/2023 0801       This score is the patient's risk of an unplanned readmission within 30 days of being discharged (0 -100%). The score is based on dignosis, age, lab data, medications, orders, and past utilization.   Low:  0-14.9   Medium: 15-21.9   High: 22-29.9   Extreme: 30 and above         Discharge Instructions:  Allergies as of 01/19/2023   No Known Allergies      Medication List     STOP taking these medications    hydrocortisone cream 0.5 %   neomycin-bacitracin-polymyxin [phone] ointment       TAKE these medications    acetaminophen 500 MG tablet Commonly known as: TYLENOL Take 2 tablets (1,000 mg total) by mouth 3 (three) times daily as needed for mild pain, moderate pain or headache. What changed:  when to take this reasons to take this   alfuzosin 10 MG 24 hr  tablet Commonly known as: UROXATRAL Take 10 mg by mouth in the morning.   alum & mag hydroxide-simeth 200-200-20 MG/5ML suspension Commonly known as: MAALOX/MYLANTA Take 30 mLs by mouth 4 (four) times daily as needed (heartburn or indigestion).   ARIPiprazole 15 MG tablet Commonly known as: ABILIFY Take 15 mg by mouth daily.   aspirin 81 MG chewable tablet Chew 81 mg by mouth daily.   atorvastatin 20 MG tablet Commonly known as: LIPITOR Take 20 mg by mouth at bedtime.   barrier cream Crea Commonly known as: non-specified Apply 1 application topically as needed (post-toileting skin protection).   clonazePAM 0.5 MG tablet Commonly known as: KLONOPIN Take 1 tablet (0.5 mg total) by mouth 2 (two) times daily as needed. What changed:  medication strength how much to take when to take this   docusate sodium 100 MG capsule Commonly known as: Colace Take 1 capsule (100 mg total) by mouth daily.   ergocalciferol 1.25 MG (50000 UT) capsule Commonly known as: VITAMIN D2 Take 50,000 Units by mouth once a week.   feeding supplement Liqd Take 237 mLs by mouth 3 (three) times daily between meals.  guaifenesin 100 MG/5ML syrup Commonly known as: ROBITUSSIN Take 300 mg by mouth every 6 (six) hours as needed for cough.   hydroxypropyl methylcellulose / hypromellose 2.5 % ophthalmic solution Commonly known as: ISOPTO TEARS / GONIOVISC Place 1 drop into both eyes 3 (three) times daily as needed for dry eyes.   hydrOXYzine 50 MG capsule Commonly known as: VISTARIL Take 50 mg by mouth every 6 (six) hours as needed (agitation).   lidocaine 2 % solution Commonly known as: XYLOCAINE Use as directed 10 mLs in the mouth or throat every 4 (four) hours as needed for mouth pain. Swish, gargle, and spit   liothyronine 25 MCG tablet Commonly known as: CYTOMEL Take 25 mcg by mouth daily.   lisinopril 40 MG tablet Commonly known as: ZESTRIL Take 40 mg by mouth daily.   loperamide 2  MG capsule Commonly known as: IMODIUM Take 4 mg by mouth every 4 (four) hours as needed for diarrhea or loose stools. (Max 8 doses in 24 hours)   magnesium hydroxide 400 MG/5ML suspension Commonly known as: MILK OF MAGNESIA Take 30 mLs by mouth daily as needed for mild constipation.   meloxicam 15 MG tablet Commonly known as: MOBIC Take 15 mg by mouth daily.   metoprolol tartrate 25 MG tablet Commonly known as: LOPRESSOR Take 25 mg by mouth 2 (two) times daily.   multivitamin with minerals Tabs tablet Take 1 tablet by mouth daily. Start taking on: January 20, 2023   polyethylene glycol powder 17 GM/SCOOP powder Commonly known as: GLYCOLAX/MIRALAX Take 17 g by mouth every other day.   polyvinyl alcohol 1.4 % ophthalmic solution Commonly known as: LIQUIFILM TEARS Place 2 drops into both eyes daily.         Follow-up Information     Hollice Espy, MD Follow up.   Specialty: Urology Contact information: [place] [address] [phone]                 No Known Allergies   The results of significant diagnostics from this hospitalization (including imaging, microbiology, ancillary and laboratory) are listed below for reference.   Consultations:   Procedures/Studies: MR BRAIN WO CONTRAST  Result Date: 01/16/2023 CLINICAL DATA:  Somnolence EXAM: MRI HEAD WITHOUT CONTRAST TECHNIQUE: Multiplanar, multiecho pulse sequences of the brain and surrounding structures were obtained without intravenous contrast. COMPARISON:  04/12/2020 FINDINGS: Brain: No acute infarct, mass effect or extra-axial collection. No acute or chronic hemorrhage. Old right PCA territory infarct. Mild periventricular white matter hyperintense T2-weighted signal change. Diffuse volume loss with ventricular enlargement. The midline structures are normal. Vascular: Major flow voids are preserved. Skull and upper cervical spine: Normal calvarium and skull base.  Visualized upper cervical spine and soft tissues are normal. Sinuses/Orbits:No paranasal sinus fluid levels or advanced mucosal thickening. No mastoid or middle ear effusion. Normal orbits. IMPRESSION: 1. No acute intracranial abnormality. 2. Old right PCA territory infarct. 3. Chronic small vessel disease and volume loss Electronically Signed   By: Ulyses Jarred M.D.   On: 01/16/2023 21:03   DG Knee 1-2 Views Right  Result Date: 01/14/2023 CLINICAL DATA:  Bilateral knee pain. EXAM: RIGHT KNEE - 1-2 VIEW COMPARISON:  None Available. FINDINGS: No fracture.  No bone lesion. Knee joint normally spaced and aligned. Chondrocalcinosis noted along the menisci. No significant knee joint degenerative changes. No joint effusion. Surrounding soft tissues are unremarkable. IMPRESSION: 1. No fracture or acute finding. 2. No significant knee joint degenerative changes. No joint  effusion. Electronically Signed   By: Lajean Manes M.D.   On: 01/14/2023 18:32   DG Knee 1-2 Views Left  Result Date: 01/14/2023 CLINICAL DATA:  Knee pain EXAM: LEFT KNEE - 2 VIEW COMPARISON:  None Available. FINDINGS: Chondrocalcinosis. Small osteophytes of all 3 compartments. There is mild joint space loss of the medial compartment. No fracture or dislocation. Preserved bone mineralization. Trace joint fluid on the lateral view. IMPRESSION: Tricompartment degenerative changes greatest of the medial compartment with chondrocalcinosis and tiny effusion. Electronically Signed   By: Jill Side M.D.   On: 01/14/2023 18:29   CT HEAD WO CONTRAST (5MM)  Result Date: 01/12/2023 CLINICAL DATA:  Provided history: Mental status change, unknown cause. EXAM: CT HEAD WITHOUT CONTRAST TECHNIQUE: Contiguous axial images were obtained from the base of the skull through the vertex without intravenous contrast. RADIATION DOSE REDUCTION: This exam was performed according to the departmental dose-optimization program which includes automated exposure control,  adjustment of the mA and/or kV according to patient size and/or use of iterative reconstruction technique. COMPARISON:  Head CT 01/05/2023. FINDINGS: Brain: Redemonstrated large chronic right PCA territory cortical/subcortical infarct affecting portions of the right temporal, occipital and parietal lobes. Cerebral atrophy with ventriculomegaly, unchanged from the recent prior head CT of 01/05/2023. Background mild patchy and ill-defined hypoattenuation within cerebral white matter, nonspecific but compatible with chronic small vessel disease. There is no acute intracranial hemorrhage. No acute demarcated cortical infarct. No extra-axial fluid collection. No evidence of an intracranial mass. No midline shift. Vascular: No hyperdense vessel. Atherosclerotic calcifications. Skull: No fracture or aggressive osseous lesion. Sinuses/Orbits: No mass or acute finding within the imaged orbits. Streak/beam hardening artifact arising from the left orbit, presumably arising from an eyelid weight. No significant paranasal sinus disease at the imaged levels. Other: Prior left mastoidectomy. IMPRESSION: 1. No evidence of acute intracranial hemorrhage or acute infarct. 2. Chronic right PCA territory infarct, chronic small vessel ischemic disease, atrophy and ventriculomegaly, unchanged from the recent prior head CT of 01/05/2023. Electronically Signed   By: Kellie Simmering D.O.   On: 01/12/2023 14:59   CT ABDOMEN PELVIS WO CONTRAST  Result Date: 01/11/2023 CLINICAL DATA:  Follow-up hydronephrosis EXAM: CT ABDOMEN AND PELVIS WITHOUT CONTRAST TECHNIQUE: Multidetector CT imaging of the abdomen and pelvis was performed following the standard protocol without IV contrast. RADIATION DOSE REDUCTION: This exam was performed according to the departmental dose-optimization program which includes automated exposure control, adjustment of the mA and/or kV according to patient size and/or use of iterative reconstruction technique. COMPARISON:   01/05/23 FINDINGS: Lower chest: Minimal right basilar atelectasis is noted. Hepatobiliary: No focal liver abnormality is seen. No gallstones, gallbladder wall thickening, or biliary dilatation. Pancreas: Unremarkable. No pancreatic ductal dilatation or surrounding inflammatory changes. Spleen: Normal in size without focal abnormality. Adrenals/Urinary Tract: Adrenal glands are within normal limits bilaterally. Right kidney demonstrates a punctate nonobstructing renal stone better visualized than on the prior exam. Left kidney demonstrates slight reduction in hydronephrosis secondary to the interval stent placement. The left UPJ stone is again identified within the renal pelvis. The more distal ureter is within normal limits. The bladder is well distended. Stomach/Bowel: Diverticular change of the colon is noted without evidence of diverticulitis. Appendix has been surgically removed. No obstructive changes of the colon are seen. Small bowel and stomach are within normal limits. Vascular/Lymphatic: Aortic atherosclerosis. No enlarged abdominal or pelvic lymph nodes. Reproductive: Prostate is unremarkable. Other: No abdominal wall hernia or abnormality. No abdominopelvic ascites. Musculoskeletal: No acute bony  abnormality is noted. IMPRESSION: Previously seen left UPJ stone is been dislodged into the left renal pelvis with ureteral stent placement. The degree of hydronephrosis has improved significantly from the prior exam consistent with the stone repositioning and stent placement. Mild right basilar atelectasis is again noted. 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 Electronically Signed   By: Inez Catalina M.D.   On: 01/11/2023 22:44   ECHOCARDIOGRAM COMPLETE  Result Date: 01/10/2023    ECHOCARDIOGRAM REPORT   Patient Name:   DAGEM LAZER Date of Exam: 01/10/2023 Medical Rec #:  [id number]   Height:       67.0 in Accession #:    [id number]  Weight:       181.7 lb Date of Birth:  1945-11-21  BSA:          1.941 m  Patient Age:    47 years    BP:           151/64 mmHg Patient Gender: M           HR:           63 bpm. Exam Location:  [facility] Procedure: 2D Echo, Color Doppler and Cardiac Doppler Indications:     Bacteremia R78.81  History:         Patient has no prior history of Echocardiogram examinations.                  Stroke; Risk Factors:Hypertension.  Sonographer:     L. Thornton-Maynard Referring Phys:  [id number] Tsosie Billing Diagnosing Phys: Ida Rogue MD IMPRESSIONS  1. Left ventricular ejection fraction, by estimation, is 55 to 60%. The left ventricle has normal function. The left ventricle has no regional wall motion abnormalities. There is moderate asymmetric left ventricular hypertrophy of the basal-septal segment. Left ventricular diastolic parameters are consistent with Grade I diastolic dysfunction (impaired relaxation).  2. Right ventricular systolic function is normal. The right ventricular size is normal. There is normal pulmonary artery systolic pressure.  3. The mitral valve is normal in structure. Mild mitral valve regurgitation. No evidence of mitral stenosis.  4. The aortic valve is normal in structure. Aortic valve regurgitation is mild. No aortic stenosis is present.  5. The inferior vena cava is normal in size with greater than 50% respiratory variability, suggesting right atrial pressure of 3 mmHg.  6. No valve vegetation noted. FINDINGS  Left Ventricle: Left ventricular ejection fraction, by estimation, is 55 to 60%. The left ventricle has normal function. The left ventricle has no regional wall motion abnormalities. The left ventricular internal cavity size was normal in size. There is  moderate asymmetric left ventricular hypertrophy of the basal-septal segment. Left ventricular diastolic parameters are consistent with Grade I diastolic dysfunction (impaired relaxation). Right Ventricle: The right ventricular size is normal. No increase in right ventricular wall thickness. Right  ventricular systolic function is normal. There is normal pulmonary artery systolic pressure. The tricuspid regurgitant velocity is 2.15 m/s, and  with an assumed right atrial pressure of 3 mmHg, the estimated right ventricular systolic pressure is [id number] mmHg. Left Atrium: Left atrial size was normal in size. Right Atrium: Right atrial size was normal in size. Pericardium: There is no evidence of pericardial effusion. Mitral Valve: The mitral valve is normal in structure. Mild mitral valve regurgitation. No evidence of mitral valve stenosis. There is no evidence of mitral valve vegetation. Tricuspid Valve: The tricuspid valve is normal in structure. Tricuspid valve regurgitation is not demonstrated. No evidence of tricuspid stenosis. There is no  evidence of tricuspid valve vegetation. Aortic Valve: The aortic valve is normal in structure. Aortic valve regurgitation is mild. Aortic regurgitation PHT measures 1183 msec. No aortic stenosis is present. Aortic valve mean gradient measures 3.5 mmHg. Aortic valve peak gradient measures 6.4 mmHg. Aortic valve area, by VTI measures 1.57 cm. There is no evidence of aortic valve vegetation. Pulmonic Valve: The pulmonic valve was normal in structure. Pulmonic valve regurgitation is not visualized. No evidence of pulmonic stenosis. There is no evidence of pulmonic valve vegetation. Aorta: The aortic root is normal in size and structure. Venous: The inferior vena cava is normal in size with greater than 50% respiratory variability, suggesting right atrial pressure of 3 mmHg. IAS/Shunts: No atrial level shunt detected by color flow Doppler.  LEFT VENTRICLE PLAX 2D LVIDd:         3.80 cm     Diastology LVIDs:         2.40 cm     LV e' medial:    5.00 cm/s LV PW:         0.90 cm     LV E/e' medial:  13.3 LV IVS:        1.60 cm     LV e' lateral:   7.18 cm/s LVOT diam:     1.90 cm     LV E/e' lateral: 9.3 LV SV:         44 LV SV Index:   23 LVOT Area:     2.84 cm  LV Volumes (MOD) LV  vol d, MOD A2C: 77.1 ml LV vol d, MOD A4C: 74.6 ml LV vol s, MOD A2C: 26.2 ml LV vol s, MOD A4C: 18.9 ml LV SV MOD A2C:     50.9 ml LV SV MOD A4C:     74.6 ml LV SV MOD BP:      54.9 ml RIGHT VENTRICLE             IVC RV S prime:     21.10 cm/s  IVC diam: 2.00 cm TAPSE (M-mode): 3.4 cm LEFT ATRIUM             Index LA diam:        2.40 cm 1.24 cm/m LA Vol (A2C):   43.8 ml 22.56 ml/m LA Vol (A4C):   38.8 ml 19.99 ml/m LA Biplane Vol: 45.3 ml 23.33 ml/m  AORTIC VALVE                    PULMONIC VALVE AV Area (Vmax):    1.90 cm     PV Vmax:       1.07 m/s AV Area (Vmean):   1.77 cm     PV Peak grad:  4.6 mmHg AV Area (VTI):     1.57 cm AV Vmax:           126.50 cm/s AV Vmean:          86.500 cm/s AV VTI:            0.280 m AV Peak Grad:      6.4 mmHg AV Mean Grad:      3.5 mmHg LVOT Vmax:         84.90 cm/s LVOT Vmean:        54.000 cm/s LVOT VTI:          0.155 m LVOT/AV VTI ratio: 0.55 AI PHT:            1183 msec  AORTA Ao  Root diam: 3.60 cm Ao Asc diam:  3.30 cm MITRAL VALVE                TRICUSPID VALVE MV Area (PHT): 3.68 cm     TR Peak grad:   18.5 mmHg MV Decel Time: 206 msec     TR Vmax:        215.00 cm/s MV E velocity: 66.60 cm/s MV A velocity: 113.00 cm/s  SHUNTS MV E/A ratio:  0.59         Systemic VTI:  0.16 m                             Systemic Diam: 1.90 cm Ida Rogue MD Electronically signed by Ida Rogue MD Signature Date/Time: 01/10/2023/2:19:31 PM    Final    DG OR UROLOGY CYSTO IMAGE ([place])  Result Date: 01/05/2023 There is no interpretation for this exam.  This order is for images obtained during a surgical procedure.  Please See "Surgeries" Tab for more information regarding the procedure.   CT CHEST ABDOMEN PELVIS WO CONTRAST  Result Date: 01/05/2023 CLINICAL DATA:  Sepsis EXAM: CT CHEST, ABDOMEN AND PELVIS WITHOUT CONTRAST TECHNIQUE: Multidetector CT imaging of the chest, abdomen and pelvis was performed following the standard protocol without IV contrast.  RADIATION DOSE REDUCTION: This exam was performed according to the departmental dose-optimization program which includes automated exposure control, adjustment of the mA and/or kV according to patient size and/or use of iterative reconstruction technique. COMPARISON:  Same day chest radiograph, CT abdomen/pelvis 10/05/2021 FINDINGS: CT CHEST FINDINGS Cardiovascular: The heart is at the upper limits of normal for size. There is no pericardial effusion there is mild calcified plaque in the imaged aortic arch. Mediastinum/Nodes: No thyroid tissue is identified. The esophagus is grossly unremarkable. There is no mediastinal or axillary lymphadenopathy. There is no bulky hilar adenopathy, within the confines of noncontrast technique. Lungs/Pleura: The trachea and central airways are patent. Lung volumes are low. Reticular opacities in the lung bases, lingula, and subpleural right upper lobe likely reflects scarring. There is a trace right pleural effusion. There is no left effusion. There is no pneumothorax There are no suspicious nodules. Musculoskeletal: There is no acute osseous abnormality or suspicious osseous lesion. CT ABDOMEN PELVIS FINDINGS Hepatobiliary: The liver and gallbladder are unremarkable. There is no biliary ductal dilatation. Pancreas: Unremarkable. Spleen: Unremarkable. Adrenals/Urinary Tract: The adrenals are unremarkable. There is a 7 mm stone at the left UPJ with upstream hydronephrosis and asymmetric perinephric stranding. Superimposed infection is not excluded. There is no organized perinephric fluid collection. There are no definite suspicious renal lesions, within the confines of noncontrast technique. No other stones are seen in either kidney or along the course of either ureter. There is no hydronephrosis or hydroureter on the right. The bladder is unremarkable. Stomach/Bowel: The stomach is unremarkable. There is no evidence of bowel obstruction. There is no abnormal bowel wall thickening  or inflammatory change. There is colonic diverticulosis without evidence of acute diverticulitis. The appendix is not identified. Vascular/Lymphatic: There is mild calcified plaque in the nonaneurysmal abdominal aorta. There is no abdominal or pelvic lymphadenopathy. Reproductive: The prostate and seminal vesicles are unremarkable. Other: There is no ascites or free air. There is a small fat containing left inguinal hernia. Musculoskeletal: There is no acute osseous abnormality or suspicious osseous lesion. There is multilevel degenerative change of the lumbar spine, most advanced at L3-L4 and L4-L5. IMPRESSION: 1. 7  mm obstructing stone at the left UPJ with upstream hydronephrosis and asymmetric perinephric stranding. Superimposed infection not excluded 2. Trace right pleural effusion. No other acute finding in the chest. Electronically Signed   By: Valetta Mole M.D.   On: 01/05/2023 11:01   CT HEAD WO CONTRAST (5MM)  Result Date: 01/05/2023 CLINICAL DATA:  Minor head trauma EXAM: CT HEAD WITHOUT CONTRAST TECHNIQUE: Contiguous axial images were obtained from the base of the skull through the vertex without intravenous contrast. RADIATION DOSE REDUCTION: This exam was performed according to the departmental dose-optimization program which includes automated exposure control, adjustment of the mA and/or kV according to patient size and/or use of iterative reconstruction technique. COMPARISON:  07/09/2022 FINDINGS: Brain: Remote right PCA territory infarct affecting temporal and occipital cortex and extending into the parietal lobe. Cerebral volume loss with ventriculomegaly that is unchanged. No evidence of acute infarct, hemorrhage, obstructive hydrocephalus, or mass. Vascular: No hyperdense vessel or unexpected calcification. Skull: Negative Sinuses/Orbits: Changes of left mastoidectomy with partial opacification. Presumably related gold weight in the left orbit. IMPRESSION: 1. No evidence of intracranial  injury. 2. 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 Electronically Signed   By: Jorje Guild M.D.   On: 01/05/2023 10:48   DG Chest Port 1 View  Result Date: 01/05/2023 CLINICAL DATA:  Sepsis EXAM: PORTABLE CHEST 1 VIEW COMPARISON:  11/16/2022 FINDINGS: Stable cardiomediastinal contours. Low lung volumes with streaky bibasilar opacities. No pleural effusion. No pneumothorax. IMPRESSION: Low lung volumes with streaky bibasilar opacities, likely atelectasis. Electronically Signed   By: Davina Poke D.O.   On: 01/05/2023 09:57      Labs: BNP (last 3 results) No results for input(s): "BNP" in the last 8760 hours. Basic Metabolic Panel: Recent Labs  Lab 01/14/23 0452 01/15/23 0616 01/16/23 0504 01/17/23 0316 01/19/23 0358  NA 139 139 138 137 141  K 3.7 4.1 3.7 3.5 3.1*  CL 105 104 103 101 106  CO2 25 23 22 23 25   GLUCOSE 122* 96 107* 81 114*  BUN 19 18 21 20 14   CREATININE 0.87 0.76 0.78 0.72 0.77  CALCIUM 8.6* 8.9 8.7* 8.9 9.1  MG 2.0 2.0 2.1 2.1 1.9  PHOS 4.4 4.0 4.0 4.4 3.8   Liver Function Tests: No results for input(s): "AST", "ALT", "ALKPHOS", "BILITOT", "PROT", "ALBUMIN" in the last 168 hours. No results for input(s): "LIPASE", "AMYLASE" in the last 168 hours. No results for input(s): "AMMONIA" in the last 168 hours. CBC: Recent Labs  Lab 01/14/23 0452 01/15/23 0616 01/16/23 0504 01/17/23 0316 01/19/23 0358  WBC 14.3* 13.1* 11.1* 7.1 6.7  HGB 10.7* 10.9* 10.2* 10.2* 10.7*  HCT 31.9* 33.5* 31.5* 31.3* 32.9*  MCV 86.9 88.4 89.0 88.2 87.3  PLT 399 455* 482* 462* 558*   Cardiac Enzymes: No results for input(s): "CKTOTAL", "CKMB", "CKMBINDEX", "TROPONINI" in the last 168 hours. BNP: Invalid input(s): "POCBNP" CBG: No results for input(s): "GLUCAP" in the last 168 hours. D-Dimer No results for input(s): "DDIMER" in the last 72 hours. Hgb A1c No results for input(s): "HGBA1C" in the last 72 hours. Lipid Profile No results for input(s):  "CHOL", "HDL", "LDLCALC", "TRIG", "CHOLHDL", "LDLDIRECT" in the last 72 hours. Thyroid function studies No results for input(s): "TSH", "T4TOTAL", "T3FREE", "THYROIDAB" in the last 72 hours.  Invalid input(s): "FREET3" Anemia work up No results for input(s): "VITAMINB12", "FOLATE", "FERRITIN", "TIBC", "IRON", "RETICCTPCT" in the last 72 hours. Urinalysis    Component Value Date/Time   COLORURINE YELLOW (A) 01/05/2023 1359  APPEARANCEUR HAZY (A) 01/05/2023 1359   APPEARANCEUR Cloudy (A) 05/22/2021 1014   LABSPEC 1.019 01/05/2023 1359   PHURINE 5.0 01/05/2023 1359   GLUCOSEU NEGATIVE 01/05/2023 1359   HGBUR MODERATE (A) 01/05/2023 1359   BILIRUBINUR NEGATIVE 01/05/2023 1359   BILIRUBINUR Negative 05/22/2021 1014   KETONESUR 5 (A) 01/05/2023 1359   PROTEINUR 100 (A) 01/05/2023 1359   NITRITE NEGATIVE 01/05/2023 1359   LEUKOCYTESUR MODERATE (A) 01/05/2023 1359   Sepsis Labs Recent Labs  Lab 01/15/23 0616 01/16/23 0504 01/17/23 0316 01/19/23 0358  WBC 13.1* 11.1* 7.1 6.7   Microbiology Recent Results (from the past 240 hour(s))  SARS Coronavirus 2 by RT PCR (hospital order, performed in [place] hospital lab) *cepheid single result test* Anterior Nasal Swab     Status: None   Collection Time: 01/12/23  4:15 PM   Specimen: Anterior Nasal Swab  Result Value Ref Range Status   SARS Coronavirus 2 by RT PCR NEGATIVE NEGATIVE Final    Comment: (NOTE) SARS-CoV-2 target nucleic acids are NOT DETECTED.  The SARS-CoV-2 RNA is generally detectable in upper and lower respiratory specimens during the acute phase of infection. The lowest concentration of SARS-CoV-2 viral copies this assay can detect is 250 copies / mL. A negative result does not preclude SARS-CoV-2 infection and should not be used as the sole basis for treatment or other patient management decisions.  A negative result may occur with improper specimen collection / handling, submission of specimen other than  nasopharyngeal swab, presence of viral mutation(s) within the areas targeted by this assay, and inadequate number of viral copies (<250 copies / mL). A negative result must be combined with clinical observations, patient history, and epidemiological information.  Fact Sheet for Patients:   https://www.patel.info/  Fact Sheet for Healthcare Providers: https://hall.com/  This test is not yet approved or  cleared by the [place] FDA and has been authorized for detection and/or diagnosis of SARS-CoV-2 by FDA under an Emergency Use Authorization (EUA).  This EUA will remain in effect (meaning this test can be used) for the duration of the COVID-19 declaration under Section 564(b)(1) of the Act, 21 U.S.C. section 360bbb-3(b)(1), unless the authorization is terminated or revoked sooner.  Performed at [facility], [address] [place], [place] [address]   Resp Panel by RT-PCR (Flu A&B, Covid)     Status: None   Collection Time: 01/12/23  6:48 PM  Result Value Ref Range Status   SARS Coronavirus 2 by RT PCR NEGATIVE NEGATIVE Final    Comment: (NOTE) SARS-CoV-2 target nucleic acids are NOT DETECTED.  The SARS-CoV-2 RNA is generally detectable in upper respiratory specimens during the acute phase of infection. The lowest concentration of SARS-CoV-2 viral copies this assay can detect is 138 copies/mL. A negative result does not preclude SARS-Cov-2 infection and should not be used as the sole basis for treatment or other patient management decisions. A negative result may occur with  improper specimen collection/handling, submission of specimen other than nasopharyngeal swab, presence of viral mutation(s) within the areas targeted by this assay, and inadequate number of viral copies(<138 copies/mL). A negative result must be combined with clinical observations, patient history, and epidemiological information. The expected result  is Negative.  Fact Sheet for Patients:  EntrepreneurPulse.com.au  Fact Sheet for Healthcare Providers:  IncredibleEmployment.be  This test is no t yet approved or cleared by the [place] FDA and  has been authorized for detection and/or diagnosis of SARS-CoV-2 by FDA under an Emergency  Use Authorization (EUA). This EUA will remain  in effect (meaning this test can be used) for the duration of the COVID-19 declaration under Section 564(b)(1) of the Act, 21 U.S.C.section 360bbb-3(b)(1), unless the authorization is terminated  or revoked sooner.       Influenza A by PCR NEGATIVE NEGATIVE Final   Influenza B by PCR NEGATIVE NEGATIVE Final    Comment: (NOTE) The Xpert Xpress SARS-CoV-2/FLU/RSV plus assay is intended as an aid in the diagnosis of influenza from Nasopharyngeal swab specimens and should not be used as a sole basis for treatment. Nasal washings and aspirates are unacceptable for Xpert Xpress SARS-CoV-2/FLU/RSV testing.  Fact Sheet for Patients: EntrepreneurPulse.com.au  Fact Sheet for Healthcare Providers: IncredibleEmployment.be  This test is not yet approved or cleared by the [place] FDA and has been authorized for detection and/or diagnosis of SARS-CoV-2 by FDA under an Emergency Use Authorization (EUA). This EUA will remain in effect (meaning this test can be used) for the duration of the COVID-19 declaration under Section 564(b)(1) of the Act, 21 U.S.C. section 360bbb-3(b)(1), unless the authorization is terminated or revoked.  Performed at [facility], [address] [place], [place] [address]      Total time spend on discharging this patient, including the last patient exam, discussing the hospital stay, instructions for ongoing care as it relates to all pertinent caregivers, as well as preparing the medical discharge records, prescriptions, and/or referrals as  applicable, is 50 minutes.    Enzo Bi, MD  [facility] 01/19/2023, 9:55 AM

## 2023-01-19 NOTE — Progress Notes (Signed)
Report given to Maudie Mercury, LPN at [facility]. All questions answered.

## 2023-01-21 NOTE — Telephone Encounter (Signed)
I spoke with Johnny Rice via telephone. She prefers to bring him in to clinic to discuss stone management. Please schedule them with me on Friday, 4/12 at 2pm for an office visit.  Additionally, Johnny Rice reports Johnny Rice is having urinary incontinence and pain. He is currently at [facility] in [place]. I will contact them via telephone tomorrow to assess and see how best we can optimize his medications for management of possible stent discomfort.

## 2023-01-22 NOTE — Telephone Encounter (Signed)
I spoke with his nurse at [facility] via telephone. She reports he is not complaining of any abdominal or flank pain. Will defer pharmacotherapy at this time and keep plans for office visit next week.

## 2023-01-22 NOTE — Telephone Encounter (Signed)
Appt scheduled

## 2023-01-30 ENCOUNTER — Encounter: Payer: Self-pay | Admitting: Physician Assistant

## 2023-01-30 ENCOUNTER — Ambulatory Visit (INDEPENDENT_AMBULATORY_CARE_PROVIDER_SITE_OTHER): Payer: Medicare Other | Admitting: Physician Assistant

## 2023-01-30 VITALS — BP 128/74 | HR 68 | Ht 67.0 in | Wt 180.0 lb

## 2023-01-30 DIAGNOSIS — N201 Calculus of ureter: Secondary | ICD-10-CM | POA: Diagnosis not present

## 2023-01-30 LAB — URINALYSIS, COMPLETE
Bilirubin, UA: NEGATIVE
Glucose, UA: NEGATIVE
Ketones, UA: NEGATIVE
Nitrite, UA: NEGATIVE
Specific Gravity, UA: 1.025 (ref 1.005–1.030)
Urobilinogen, Ur: 0.2 mg/dL (ref 0.2–1.0)
pH, UA: 5.5 (ref 5.0–7.5)

## 2023-01-30 LAB — MICROSCOPIC EXAMINATION: WBC, UA: >30 /hpf — AB (ref 0–5)

## 2023-01-30 NOTE — Progress Notes (Signed)
01/30/2023 2:15 PM   Johnny Rice 30-Nov-1945 [id number]  CC: Chief Complaint  Patient presents with   Nephrolithiasis   HPI: Johnny Rice is a 77 y.o. male with PMH dementia and CVA with left-sided deficits who was recently admitted with Enterococcus urosepsis associated with a 7 mm left UPJ stone, s/p left ureteral stent placement with Dr. Apolinar Junes on 01/05/2023, who presents today to discuss a definitive stone management.  He is accompanied today by a staff member from [facility], where he currently resides.   Follow-up CTAP without contrast on 01/11/2023 showed that the previously seen left UPJ stone had been dislodged into the left renal pelvis.  His hydronephrosis had significantly improved and stent was in appropriate position.  Today he reports no acute concerns. He is tolerating his stent well. He reports he would like to pursue whichever method of definitive stone management would offer the most anesthesia so as to minimize any potential discomfort.  He confirms today that he makes his own medical decisions and he does not desire [place] to speak with anyone else about his medical care, in keeping with his 2021 DPR with [place].  PMH: Past Medical History:  Diagnosis Date   Hypertension    Stroke     Surgical History: Past Surgical History:  Procedure Laterality Date   APPENDECTOMY     CYSTOSCOPY W/ URETERAL STENT PLACEMENT Left 01/05/2023   Procedure: CYSTOSCOPY WITH RETROGRADE PYELOGRAM/URETERAL STENT PLACEMENT;  Surgeon: Vanna Scotland, MD;  Location: [facility] ORS;  Service: Urology;  Laterality: Left;    Home Medications:  Allergies as of 01/30/2023   No Known Allergies      Medication List        Accurate as of January 30, 2023  2:15 PM. If you have any questions, ask your nurse or doctor.          acetaminophen 500 MG tablet Commonly known as: TYLENOL Take 2 tablets (1,000 mg total) by mouth 3 (three) times daily as needed for mild pain, moderate pain  or headache.   alfuzosin 10 MG 24 hr tablet Commonly known as: UROXATRAL Take 10 mg by mouth in the morning.   alum & mag hydroxide-simeth 200-200-20 MG/5ML suspension Commonly known as: MAALOX/MYLANTA Take 30 mLs by mouth 4 (four) times daily as needed (heartburn or indigestion).   ARIPiprazole 15 MG tablet Commonly known as: ABILIFY Take 15 mg by mouth daily.   aspirin 81 MG chewable tablet Chew 81 mg by mouth daily.   atorvastatin 20 MG tablet Commonly known as: LIPITOR Take 20 mg by mouth at bedtime.   barrier cream Crea Commonly known as: non-specified Apply 1 application topically as needed (post-toileting skin protection).   clonazePAM 0.5 MG tablet Commonly known as: KLONOPIN Take 1 tablet (0.5 mg total) by mouth 2 (two) times daily as needed.   docusate sodium 100 MG capsule Commonly known as: Colace Take 1 capsule (100 mg total) by mouth daily.   ergocalciferol 1.25 MG (50000 UT) capsule Commonly known as: VITAMIN D2 Take 50,000 Units by mouth once a week.   feeding supplement Liqd Take 237 mLs by mouth 3 (three) times daily between meals.   guaifenesin 100 MG/5ML syrup Commonly known as: ROBITUSSIN Take 300 mg by mouth every 6 (six) hours as needed for cough.   hydroxypropyl methylcellulose / hypromellose 2.5 % ophthalmic solution Commonly known as: ISOPTO TEARS / GONIOVISC Place 1 drop into both eyes 3 (three) times daily as needed for dry eyes.  hydrOXYzine 50 MG capsule Commonly known as: VISTARIL Take 50 mg by mouth every 6 (six) hours as needed (agitation).   lidocaine 2 % solution Commonly known as: XYLOCAINE Use as directed 10 mLs in the mouth or throat every 4 (four) hours as needed for mouth pain. Swish, gargle, and spit   liothyronine 25 MCG tablet Commonly known as: CYTOMEL Take 25 mcg by mouth daily.   lisinopril 40 MG tablet Commonly known as: ZESTRIL Take 40 mg by mouth daily.   loperamide 2 MG capsule Commonly known as:  IMODIUM Take 4 mg by mouth every 4 (four) hours as needed for diarrhea or loose stools. (Max 8 doses in 24 hours)   magnesium hydroxide 400 MG/5ML suspension Commonly known as: MILK OF MAGNESIA Take 30 mLs by mouth daily as needed for mild constipation.   meloxicam 15 MG tablet Commonly known as: MOBIC Take 15 mg by mouth daily.   metoprolol tartrate 25 MG tablet Commonly known as: LOPRESSOR Take 25 mg by mouth 2 (two) times daily.   multivitamin with minerals Tabs tablet Take 1 tablet by mouth daily.   polyethylene glycol powder 17 GM/SCOOP powder Commonly known as: GLYCOLAX/MIRALAX Take 17 g by mouth every other day.   polyvinyl alcohol 1.4 % ophthalmic solution Commonly known as: LIQUIFILM TEARS Place 2 drops into both eyes daily.        Allergies:  No Known Allergies  Family History: Family History  Problem Relation Age of Onset   Heart disease Neg Hx     Social History:   reports that he has never smoked. He has never been exposed to tobacco smoke. He has never used smokeless tobacco. He reports that he does not currently use alcohol. He reports that he does not currently use drugs.  Physical Exam: BP 128/74   Pulse 68   Ht  (1.702 m)   Wt 180 lb (81.6 kg)   BMI 28.19 kg/m   Constitutional:  Alert, no acute distress, nontoxic appearing HEENT: [place], AT Cardiovascular: No clubbing, cyanosis, or edema Respiratory: Normal respiratory effort, no increased work of breathing Skin: No rashes, bruises or suspicious lesions Neurologic: Left sided deficits s/p CVA Psychiatric: Normal mood and affect  Assessment & Plan:   1. Left ureteral stone 7mm left renal stone requiring definitive management, left ureteral stent remains in place.  We discussed various treatment options for his stone including ESWL vs. ureteroscopy with laser lithotripsy and stent exchange.   We specifically discussed that ESWL is a less invasive procedure that requires less anesthesia,  however patients have to pass their residual stone fragments, which may take several weeks and be associated with continued renal colic, though I anticipate his left ureteral stent will facilitate passage. By comparison, ureteroscopy is a more invasive surgical procedure that requires more anesthesia and replacement of a ureteral stent, which will remain in place for approximately 3-10 days and can be associated with flank pain, bladder pain, dysuria, urgency, frequency, urinary leakage, and gross hematuria.  Based on this conversation, he would like to proceed with ureteroscopy. Urine sample obtained today for preop UA and culture. Will call him next week to discuss timing of procedure. Will not update daughter Hollie Salk today based on patient's wishes.  - Urinalysis, Complete - CULTURE, URINE COMPREHENSIVE   Return for Will call to schedule surgery.  Carman Ching, PA-C  [facility] [address] [place], [place] [address] [phone]

## 2023-02-04 LAB — CULTURE, URINE COMPREHENSIVE

## 2023-02-10 ENCOUNTER — Other Ambulatory Visit: Payer: Self-pay | Admitting: Physician Assistant

## 2023-02-10 DIAGNOSIS — N201 Calculus of ureter: Secondary | ICD-10-CM

## 2023-02-10 NOTE — Progress Notes (Signed)
Surgical Physician Order Form [facility] [place]  Dr. Apolinar Junes * Scheduling expectation : Next Available  *Length of Case:   *Clearance needed: no  *Anticoagulation Instructions: N/A  *Aspirin Instructions: Ok to continue Aspirin  *Post-op visit Date/Instructions:   TBD  *Diagnosis: Left UPJ Stone  *Procedure: left  Ureteroscopy w/laser lithotripsy & stent exchange ([address])   Additional orders: N/A  -Admit type: OUTpatient  -Anesthesia: General  -VTE Prophylaxis Standing Order SCD's       Other:   -Standing Lab Orders Per Anesthesia    Lab other:  Possible repeat UA/cx if needed based on timing  -Standing Test orders EKG/Chest x-ray per Anesthesia       Test other:   - Medications:  Ancef 2gm IV  -Other orders:  N/A

## 2023-02-16 ENCOUNTER — Telehealth: Payer: Self-pay

## 2023-02-16 NOTE — Telephone Encounter (Signed)
I spoke with Johnny Rice and [facility] [address], [place]. We have discussed possible surgery dates and Monday May 20th, 2024 was agreed upon by all parties. Patient given information about surgery date, what to expect pre-operatively and post operatively.  We discussed that a Pre-Admission Testing office will be calling to set up the pre-op visit that will take place prior to surgery, and that these appointments are typically done over the phone with a Pre-Admissions RN. Informed patient that our office will communicate any additional care to be provided after surgery. Patients questions or concerns were discussed during our call. Advised to call our office should there be any additional information, questions or concerns that arise. Patient verbalized understanding.

## 2023-02-16 NOTE — Progress Notes (Signed)
   [facility] Surgical Posting Form  Surgery Date: Date: 03/09/2023  Surgeon: Dr. Vanna Scotland, MD  Inpt ( No  )   Outpt (Yes)   Obs ( No  )   Diagnosis: N20.1 Left Ureteral Stone  -CPT: [phone]  Surgery: Left Ureteroscopy with Laser Lithotripsy and Stent Exchange  Stop Anticoagulations: No, may continue ASA  Cardiac/Medical/Pulmonary Clearance needed: no  *Orders entered into EPIC  Date: 02/16/23   *Case booked in [place]  Date: 02/12/2023  *Notified pt of Surgery: Date: 02/12/2023  PRE-OP UA & CX: Yes, will obtain at [facility].  *Placed into Prior Authorization Work Angela Nevin Date: 02/16/23  Assistant/laser/rep:No   For Pre-Op Call please call [phone] and ask for Zollie Scale or Selena Batten at [facility] Patient is a resident of the [facility].

## 2023-02-27 ENCOUNTER — Encounter
Admission: RE | Admit: 2023-02-27 | Discharge: 2023-02-27 | Disposition: A | Discharge status: Home / Self Care | Payer: Medicare Other | Source: Ambulatory Visit | Attending: Urology | Admitting: Urology

## 2023-02-27 ENCOUNTER — Telehealth: Payer: Self-pay

## 2023-02-27 HISTORY — DX: Gastro-esophageal reflux disease without esophagitis: K21.9

## 2023-02-27 HISTORY — DX: Anemia, unspecified: D64.9

## 2023-02-27 HISTORY — DX: Personal history of urinary calculi: Z87.442

## 2023-02-27 HISTORY — DX: Hemiplegia, unspecified affecting left nondominant side: G81.94

## 2023-02-27 HISTORY — DX: Attention-deficit hyperactivity disorder, unspecified type: F90.9

## 2023-02-27 NOTE — Patient Instructions (Addendum)
Your procedure is scheduled on: 03/09/23 - Monday Report to the Registration Desk on the 1st floor of the [facility]. To find out your arrival time, please call [phone] between 1PM - 3PM on: 03/06/23 - Friday If your arrival time is 6:00 am, do not arrive before that time as the [facility] entrance doors do not open until 6:00 am.  REMEMBER: Instructions that are not followed completely may result in serious medical risk, up to and including death; or upon the discretion of your surgeon and anesthesiologist your surgery may need to be rescheduled.  Do not eat food or drink any liquids after midnight the night before surgery.  No gum chewing or hard candies.   One week prior to surgery: 03/02/23- Stop meloxicam (MOBIC) and  Anti-inflammatories (NSAIDS) such as Advil, Aleve, Ibuprofen, Motrin, Naproxen, Naprosyn and Aspirin based products such as Excedrin, Goody's Powder, BC Powder.  Stop ANY OVER THE COUNTER supplements until after surgery.  You may however, continue to take Tylenol if needed for pain up until the day of surgery.   TAKE ONLY THESE MEDICATIONS THE MORNING OF SURGERY WITH A SIP OF WATER:  alfuzosin (UROXATRAL)  ARIPiprazole (ABILIFY ) liothyronine (CYTOMEL)  metoprolol tartrate (LOPRESSOR)  clonazePAM (KLONOPIN) - take only if needed.   No Alcohol for 24 hours before or after surgery.  No Smoking including e-cigarettes for 24 hours before surgery.  No chewable tobacco products for at least 6 hours before surgery.  No nicotine patches on the day of surgery.  Do not use any "recreational" drugs for at least a week (preferably 2 weeks) before your surgery.  Please be advised that the combination of cocaine and anesthesia may have negative outcomes, up to and including death. If you test positive for cocaine, your surgery will be cancelled.  On the morning of surgery brush your teeth with toothpaste and water, you may rinse your mouth with mouthwash if you  wish. Do not swallow any toothpaste or mouthwash.  Do not wear jewelry, make-up, hairpins, clips or nail polish.  Do not wear lotions, powders, or perfumes.   Do not shave body hair from the neck down 48 hours before surgery.  Contact lenses, hearing aids and dentures may not be worn into surgery.  Do not bring valuables to the hospital. [facility] is not responsible for any missing/lost belongings or valuables.   Notify your doctor if there is any change in your medical condition (cold, fever, infection).  Wear comfortable clothing (specific to your surgery type) to the hospital.  After surgery, you can help prevent lung complications by doing breathing exercises.  Take deep breaths and cough every 1-2 hours. Your doctor may order a device called an Incentive Spirometer to help you take deep breaths. When coughing or sneezing, hold a pillow firmly against your incision with both hands. This is called "splinting." Doing this helps protect your incision. It also decreases belly discomfort.  If you are being admitted to the hospital overnight, leave your suitcase in the car. After surgery it may be brought to your room.  In case of increased patient census, it may be necessary for you, the patient, to continue your postoperative care in the Same Day Surgery department.  If you are being discharged the day of surgery, you will not be allowed to drive home. You will need a responsible individual to drive you home and stay with you for 24 hours after surgery.   If you are taking public transportation, you  will need to have a responsible individual with you.  Please call the Pre-admissions Testing Dept. at [phone] if you have any questions about these instructions.  Surgery Visitation Policy:  Patients having surgery or a procedure may have two visitors.  Children under the age of 43 must have an adult with them who is not the patient.  Inpatient Visitation:    Visiting hours  are 7 a.m. to 8 p.m. Up to four visitors are allowed at one time in a patient room. The visitors may rotate out with other people during the day.  One visitor age 56 or older may stay with the patient overnight and must be in the room by 8 p.m.

## 2023-02-27 NOTE — Telephone Encounter (Signed)
Called [place] of  in regards to the UA and Culture that was done on the patient on 02/18/23. Per culture/faxed report states it was yeast but the [facility] lab that they use do not do susceptibilities report on yeast. I spoke with Carollee Herter is Sam's absence about this. Need to bring patient in next week with someone to get a cath UA and culture done to get a better sample and per [facility] will run full report on yeast, need to know if its yeast in the bladder or just around the penile skin. Patient is scheduled for surgery on 03/09/23, need these results before then.  I had to leave a message with nurse at [facility] and asked for someone to call us back to schedule patient to come in for this.

## 2023-02-27 NOTE — Telephone Encounter (Signed)
Patient is at [facility] called there and spoke with [facility]. Patient will come on 03/03/23 fort CATH UA sample only. I added him to the schedule. FYI to [facility] and [facility]

## 2023-03-03 ENCOUNTER — Encounter
Admission: RE | Admit: 2023-03-03 | Discharge: 2023-03-03 | Disposition: A | Discharge status: Home / Self Care | Payer: Medicare Other | Source: Ambulatory Visit | Attending: Urology | Admitting: Urology

## 2023-03-03 ENCOUNTER — Ambulatory Visit (INDEPENDENT_AMBULATORY_CARE_PROVIDER_SITE_OTHER): Payer: Medicare Other | Admitting: Physician Assistant

## 2023-03-03 ENCOUNTER — Ambulatory Visit: Payer: Medicare Other | Admitting: Physician Assistant

## 2023-03-03 DIAGNOSIS — Z01812 Encounter for preprocedural laboratory examination: Secondary | ICD-10-CM | POA: Insufficient documentation

## 2023-03-03 DIAGNOSIS — N390 Urinary tract infection, site not specified: Secondary | ICD-10-CM

## 2023-03-03 DIAGNOSIS — Z01818 Encounter for other preprocedural examination: Secondary | ICD-10-CM

## 2023-03-03 DIAGNOSIS — Z2989 Encounter for other specified prophylactic measures: Secondary | ICD-10-CM

## 2023-03-03 LAB — POTASSIUM: Potassium: 4.3 mmol/L (ref 3.5–5.1)

## 2023-03-03 NOTE — Progress Notes (Signed)
In and Out Catheterization  Patient is present today for a I & O catheterization due to cath sample. Patient was cleaned and prepped in a sterile fashion with betadine . A 14FR cath was inserted no complications were noted , 100 ml of urine return was noted, urine was dark brown and cloudy in color. A clean urine sample was collected for UA and culture. Bladder was drained  And catheter was removed with out difficulty.    Performed by: Vela Prose

## 2023-03-04 LAB — URINALYSIS, COMPLETE
Bilirubin, UA: NEGATIVE
Glucose, UA: NEGATIVE
Nitrite, UA: NEGATIVE
Specific Gravity, UA: >1.03 — ABNORMAL HIGH (ref 1.005–1.030)
Urobilinogen, Ur: 0.2 mg/dL (ref 0.2–1.0)
pH, UA: 5.5 (ref 5.0–7.5)

## 2023-03-04 LAB — MICROSCOPIC EXAMINATION
RBC, Urine: >30 /hpf — AB (ref 0–2)
WBC, UA: >30 /hpf — AB (ref 0–5)

## 2023-03-06 LAB — CULTURE, URINE COMPREHENSIVE

## 2023-03-09 ENCOUNTER — Ambulatory Visit: Payer: Medicare Other | Admitting: Urgent Care

## 2023-03-09 ENCOUNTER — Ambulatory Visit: Payer: Medicare Other

## 2023-03-09 ENCOUNTER — Encounter
Admission: RE | Disposition: A | Discharge status: Skilled Nursing Facility | Payer: Self-pay | Source: Home / Self Care | Attending: Urology

## 2023-03-09 ENCOUNTER — Ambulatory Visit
Admission: RE | Admit: 2023-03-09 | Discharge: 2023-03-09 | Disposition: A | Discharge status: Skilled Nursing Facility | Payer: Medicare Other | Attending: Urology | Admitting: Urology

## 2023-03-09 ENCOUNTER — Other Ambulatory Visit: Payer: Self-pay

## 2023-03-09 ENCOUNTER — Encounter: Payer: Self-pay | Admitting: Urology

## 2023-03-09 DIAGNOSIS — Z8619 Personal history of other infectious and parasitic diseases: Secondary | ICD-10-CM

## 2023-03-09 DIAGNOSIS — F0390 Unspecified dementia without behavioral disturbance: Secondary | ICD-10-CM | POA: Insufficient documentation

## 2023-03-09 DIAGNOSIS — N201 Calculus of ureter: Secondary | ICD-10-CM

## 2023-03-09 DIAGNOSIS — E876 Hypokalemia: Secondary | ICD-10-CM

## 2023-03-09 DIAGNOSIS — F419 Anxiety disorder, unspecified: Secondary | ICD-10-CM | POA: Diagnosis not present

## 2023-03-09 DIAGNOSIS — Z8673 Personal history of transient ischemic attack (TIA), and cerebral infarction without residual deficits: Secondary | ICD-10-CM | POA: Diagnosis not present

## 2023-03-09 DIAGNOSIS — I10 Essential (primary) hypertension: Secondary | ICD-10-CM | POA: Insufficient documentation

## 2023-03-09 DIAGNOSIS — N132 Hydronephrosis with renal and ureteral calculous obstruction: Secondary | ICD-10-CM | POA: Diagnosis not present

## 2023-03-09 DIAGNOSIS — F32A Depression, unspecified: Secondary | ICD-10-CM | POA: Diagnosis not present

## 2023-03-09 DIAGNOSIS — Z01812 Encounter for preprocedural laboratory examination: Secondary | ICD-10-CM

## 2023-03-09 HISTORY — PX: CYSTOSCOPY/URETEROSCOPY/HOLMIUM LASER/STENT PLACEMENT: SHX6546

## 2023-03-09 SURGERY — CYSTOSCOPY/URETEROSCOPY/HOLMIUM LASER/STENT PLACEMENT
Anesthesia: General | Laterality: Left

## 2023-03-09 MED ORDER — KETOROLAC TROMETHAMINE 30 MG/ML IJ SOLN
INTRAMUSCULAR | Status: AC
  Filled 2023-03-09: qty 1

## 2023-03-09 MED ORDER — FENTANYL CITRATE (PF) 100 MCG/2ML IJ SOLN
INTRAMUSCULAR | Status: AC
  Filled 2023-03-09: qty 2

## 2023-03-09 MED ORDER — LIDOCAINE HCL (CARDIAC) PF 100 MG/5ML IV SOSY
PREFILLED_SYRINGE | INTRAVENOUS | Status: DC | PRN
  Administered 2023-03-09: 50 mg via INTRAVENOUS

## 2023-03-09 MED ORDER — CHLORHEXIDINE GLUCONATE 0.12 % MT SOLN
OROMUCOSAL | Status: AC
  Filled 2023-03-09: qty 15

## 2023-03-09 MED ORDER — FAMOTIDINE 20 MG PO TABS
20.0000 mg | ORAL_TABLET | Freq: Once | ORAL | Status: AC
  Administered 2023-03-09: 20 mg via ORAL

## 2023-03-09 MED ORDER — FENTANYL CITRATE (PF) 100 MCG/2ML IJ SOLN: 25.0000 ug | INTRAMUSCULAR | Status: DC | PRN

## 2023-03-09 MED ORDER — GLYCOPYRROLATE 0.2 MG/ML IJ SOLN
INTRAMUSCULAR | Status: AC
  Filled 2023-03-09: qty 1

## 2023-03-09 MED ORDER — FAMOTIDINE 20 MG PO TABS
ORAL_TABLET | ORAL | Status: AC
  Filled 2023-03-09: qty 1

## 2023-03-09 MED ORDER — ONDANSETRON HCL 4 MG/2ML IJ SOLN
INTRAMUSCULAR | Status: DC | PRN
  Administered 2023-03-09: 4 mg via INTRAVENOUS

## 2023-03-09 MED ORDER — IOHEXOL 180 MG/ML  SOLN
INTRAMUSCULAR | Status: DC | PRN
  Administered 2023-03-09: 10 mL

## 2023-03-09 MED ORDER — EPHEDRINE SULFATE (PRESSORS) 50 MG/ML IJ SOLN
INTRAMUSCULAR | Status: DC | PRN
  Administered 2023-03-09: 15 mg via INTRAVENOUS
  Administered 2023-03-09: 10 mg via INTRAVENOUS

## 2023-03-09 MED ORDER — LACTATED RINGERS IV SOLN: INTRAVENOUS | Status: DC

## 2023-03-09 MED ORDER — CLONAZEPAM 0.5 MG PO TABS
0.5000 mg | ORAL_TABLET | Freq: Once | ORAL | Status: AC
  Administered 2023-03-09: 0.5 mg via ORAL
  Filled 2023-03-09: qty 1

## 2023-03-09 MED ORDER — PROPOFOL 10 MG/ML IV BOLUS
INTRAVENOUS | Status: DC | PRN
  Administered 2023-03-09: 100 mg via INTRAVENOUS

## 2023-03-09 MED ORDER — SODIUM CHLORIDE 0.9 % IR SOLN
Status: DC | PRN
  Administered 2023-03-09: 1500 mL

## 2023-03-09 MED ORDER — ROCURONIUM BROMIDE 100 MG/10ML IV SOLN
INTRAVENOUS | Status: DC | PRN
  Administered 2023-03-09: 50 mg via INTRAVENOUS

## 2023-03-09 MED ORDER — ORAL CARE MOUTH RINSE: 15.0000 mL | Freq: Once | OROMUCOSAL | Status: AC

## 2023-03-09 MED ORDER — CHLORHEXIDINE GLUCONATE 0.12 % MT SOLN
15.0000 mL | Freq: Once | OROMUCOSAL | Status: AC
  Administered 2023-03-09: 15 mL via OROMUCOSAL

## 2023-03-09 MED ORDER — DEXAMETHASONE SODIUM PHOSPHATE 10 MG/ML IJ SOLN
INTRAMUSCULAR | Status: AC
  Filled 2023-03-09: qty 1

## 2023-03-09 MED ORDER — CLONAZEPAM 0.5 MG PO TBDP
0.5000 mg | ORAL_TABLET | Freq: Once | ORAL | Status: DC
  Filled 2023-03-09: qty 1

## 2023-03-09 MED ORDER — EPHEDRINE 5 MG/ML INJ
INTRAVENOUS | Status: AC
  Filled 2023-03-09: qty 5

## 2023-03-09 MED ORDER — KETOROLAC TROMETHAMINE 30 MG/ML IJ SOLN
INTRAMUSCULAR | Status: DC | PRN
  Administered 2023-03-09: 30 mg via INTRAVENOUS

## 2023-03-09 MED ORDER — GLYCOPYRROLATE 0.2 MG/ML IJ SOLN
INTRAMUSCULAR | Status: DC | PRN
  Administered 2023-03-09 (×2): .2 mg via INTRAVENOUS

## 2023-03-09 MED ORDER — FENTANYL CITRATE (PF) 100 MCG/2ML IJ SOLN
INTRAMUSCULAR | Status: DC | PRN
  Administered 2023-03-09: 50 ug via INTRAVENOUS

## 2023-03-09 MED ORDER — HYDROCODONE-ACETAMINOPHEN 5-325 MG PO TABS: 1.0000 | ORAL_TABLET | Freq: Four times a day (QID) | ORAL | 0 refills | Status: DC | PRN

## 2023-03-09 MED ORDER — PROPOFOL 10 MG/ML IV BOLUS
INTRAVENOUS | Status: AC
  Filled 2023-03-09: qty 20

## 2023-03-09 MED ORDER — ROCURONIUM BROMIDE 10 MG/ML (PF) SYRINGE
PREFILLED_SYRINGE | INTRAVENOUS | Status: AC
  Filled 2023-03-09: qty 10

## 2023-03-09 MED ORDER — ONDANSETRON HCL 4 MG/2ML IJ SOLN
INTRAMUSCULAR | Status: AC
  Filled 2023-03-09: qty 2

## 2023-03-09 MED ORDER — LIDOCAINE HCL (PF) 2 % IJ SOLN
INTRAMUSCULAR | Status: AC
  Filled 2023-03-09: qty 5

## 2023-03-09 MED ORDER — CEFAZOLIN SODIUM-DEXTROSE 2-4 GM/100ML-% IV SOLN
2.0000 g | INTRAVENOUS | Status: AC
  Administered 2023-03-09: 2 g via INTRAVENOUS

## 2023-03-09 MED ORDER — OXYCODONE HCL 5 MG PO TABS: 5.0000 mg | ORAL_TABLET | Freq: Once | ORAL | Status: DC | PRN

## 2023-03-09 MED ORDER — HYDROCODONE-ACETAMINOPHEN 5-325 MG PO TABS: 1.0000 | ORAL_TABLET | Freq: Four times a day (QID) | ORAL | 0 refills | Status: AC | PRN

## 2023-03-09 MED ORDER — SUGAMMADEX SODIUM 200 MG/2ML IV SOLN
INTRAVENOUS | Status: DC | PRN
  Administered 2023-03-09: 200 mg via INTRAVENOUS

## 2023-03-09 MED ORDER — OXYCODONE HCL 5 MG/5ML PO SOLN: 5.0000 mg | Freq: Once | ORAL | Status: DC | PRN

## 2023-03-09 MED ORDER — CEFAZOLIN SODIUM-DEXTROSE 2-4 GM/100ML-% IV SOLN
INTRAVENOUS | Status: AC
  Filled 2023-03-09: qty 100

## 2023-03-09 MED ORDER — DEXAMETHASONE SODIUM PHOSPHATE 10 MG/ML IJ SOLN
INTRAMUSCULAR | Status: DC | PRN
  Administered 2023-03-09: 10 mg via INTRAVENOUS

## 2023-03-09 SURGICAL SUPPLY — 29 items
ADH LQ OCL WTPRF AMP STRL LF (MISCELLANEOUS)
ADHESIVE MASTISOL STRL (MISCELLANEOUS) IMPLANT
BAG DRAIN SIEMENS DORNER NS (MISCELLANEOUS) ×1 IMPLANT
BAG DRN NS LF (MISCELLANEOUS) ×1
BASKET ZERO TIP 1.9FR (BASKET) IMPLANT
BRUSH SCRUB EZ 1% IODOPHOR (MISCELLANEOUS) ×1 IMPLANT
BSKT STON RTRVL ZERO TP 1.9FR (BASKET) ×1
CATH URET FLEX-TIP 2 LUMEN 10F (CATHETERS) IMPLANT
CATH URETL OPEN 5X70 (CATHETERS) ×1 IMPLANT
CNTNR URN SCR LID CUP LEK RST (MISCELLANEOUS) IMPLANT
CONT SPEC 4OZ STRL OR WHT (MISCELLANEOUS)
DRAPE UTILITY 15X26 TOWEL STRL (DRAPES) ×1 IMPLANT
DRSG TEGADERM 2-3/8X2-3/4 SM (GAUZE/BANDAGES/DRESSINGS) IMPLANT
FIBER LASER MOSES 200 DFL (Laser) IMPLANT
GLOVE BIO SURGEON STRL SZ 6.5 (GLOVE) ×1 IMPLANT
GOWN STRL REUS W/ TWL LRG LVL3 (GOWN DISPOSABLE) ×2 IMPLANT
GOWN STRL REUS W/TWL LRG LVL3 (GOWN DISPOSABLE) ×2
GUIDEWIRE GREEN .038 145CM (MISCELLANEOUS) IMPLANT
GUIDEWIRE STR DUAL SENSOR (WIRE) ×1 IMPLANT
IV NS IRRIG 3000ML ARTHROMATIC (IV SOLUTION) ×1 IMPLANT
KIT TURNOVER CYSTO (KITS) ×1 IMPLANT
PACK CYSTO AR (MISCELLANEOUS) ×1 IMPLANT
SET CYSTO W/LG BORE CLAMP LF (SET/KITS/TRAYS/PACK) ×1 IMPLANT
SHEATH NAVIGATOR HD 12/14X36 (SHEATH) IMPLANT
STENT URET 6FRX24 CONTOUR (STENTS) IMPLANT
STENT URET 6FRX26 CONTOUR (STENTS) IMPLANT
SURGILUBE 2OZ TUBE FLIPTOP (MISCELLANEOUS) ×1 IMPLANT
TRAP FLUID SMOKE EVACUATOR (MISCELLANEOUS) ×1 IMPLANT
WATER STERILE IRR 500ML POUR (IV SOLUTION) ×1 IMPLANT

## 2023-03-09 NOTE — Progress Notes (Signed)
Patient requested anxiety medicine prior to d/c. Notified Dr. Lorette Ang and Klonopin 0.5 mg was ordered. Obtained medication from pharmacy and administered. Patient d/c back to facility with caregiver.

## 2023-03-09 NOTE — Anesthesia Postprocedure Evaluation (Signed)
Anesthesia Post Note  Patient: Johnny Rice  Procedure(s) Performed: CYSTOSCOPY/URETEROSCOPY/HOLMIUM LASER/STENT PLACEMENT/RETROGRADE PYELOGRAM (Left)  Patient location during evaluation: PACU Anesthesia Type: General Level of consciousness: awake and alert Pain management: pain level controlled Vital Signs Assessment: post-procedure vital signs reviewed and stable Respiratory status: spontaneous breathing, nonlabored ventilation, respiratory function stable and patient connected to nasal cannula oxygen Cardiovascular status: blood pressure returned to baseline and stable Postop Assessment: no apparent nausea or vomiting Anesthetic complications: no  No notable events documented.   Last Vitals:  Vitals:   03/09/23 1130 03/09/23 1146  BP: (!) 146/96 120/80  Pulse: 78 97  Resp: 13 16  Temp: (!) 36.2 C (!) 36.2 C  SpO2: 98% 95%    Last Pain:  Vitals:   03/09/23 1146  TempSrc:   PainSc: 0-No pain                 Stephanie Coup

## 2023-03-09 NOTE — Anesthesia Preprocedure Evaluation (Signed)
Anesthesia Evaluation  Patient identified by MRN, date of birth, ID band Patient confused  General Assessment Comment:  Patient responds to voice, can answer "good" to a "how are you doing " question, but otherwise does not verbally respond, appears confused.  Reviewed: Allergy & Precautions, NPO status , Patient's Chart, lab work & pertinent test results, Unable to perform ROS - Chart review only  History of Anesthesia Complications Negative for: history of anesthetic complications  Airway Mallampati: Unable to assess  TM Distance: >3 FB Neck ROM: Full    Dental  (+) Chipped, Dental Advidsory Given   Pulmonary neg pulmonary ROS, neg sleep apnea, neg COPD, Patient abstained from smoking.Not current smoker   Pulmonary exam normal breath sounds clear to auscultation       Cardiovascular Exercise Tolerance: Good METShypertension, Pt. on medications (-) CAD and (-) Past MI negative cardio ROS Normal cardiovascular exam(-) dysrhythmias  Rhythm:Regular Rate:Normal - Systolic murmurs    Neuro/Psych  PSYCHIATRIC DISORDERS Anxiety Depression   Dementia CVA negative neurological ROS  negative psych ROS   GI/Hepatic negative GI ROS, Neg liver ROS,neg GERD  ,,(+)     (-) substance abuse    Endo/Other  negative endocrine ROSneg diabetesHypothyroidism    Renal/GU negative Renal ROS     Musculoskeletal  (+) Arthritis ,    Abdominal   Peds  Hematology negative hematology ROS (+)   Anesthesia Other Findings Past Medical History: No date: ADHD (attention deficit hyperactivity disorder) No date: Anemia No date: GERD (gastroesophageal reflux disease) No date: Hemiplegia affecting left nondominant side (HCC) No date: History of kidney stones No date: Hypertension No date: Stroke [facility])  Past Surgical History: No date: APPENDECTOMY 01/05/2023: CYSTOSCOPY W/ URETERAL STENT PLACEMENT; Left     Comment:  Procedure: CYSTOSCOPY WITH  RETROGRADE PYELOGRAM/URETERAL              STENT PLACEMENT;  Surgeon: Vanna Scotland, MD;                Location: [facility] ORS;  Service: Urology;  Laterality: Left;  BMI    Body Mass Index: 28.19 kg/m      Reproductive/Obstetrics negative OB ROS                             Anesthesia Physical Anesthesia Plan  ASA: 3  Anesthesia Plan: General   Post-op Pain Management: Minimal or no pain anticipated   Induction: Intravenous  PONV Risk Score and Plan: 3 and Ondansetron, Dexamethasone and Treatment may vary due to age or medical condition  Airway Management Planned: Oral ETT and Video Laryngoscope Planned  Additional Equipment: None  Intra-op Plan:   Post-operative Plan: Extubation in OR  Informed Consent: I have reviewed the patients History and Physical, chart, labs and discussed the procedure including the risks, benefits and alternatives for the proposed anesthesia with the patient or authorized representative who has indicated his/her understanding and acceptance.     Dental advisory given  Plan Discussed with: CRNA and Surgeon  Anesthesia Plan Comments: (Discussed risks of anesthesia with patient's daughter Myra by phone (given patient's baseline dementia state), including PONV, sore throat, lip/dental/eye damage. Rare risks discussed as well, such as cardiorespiratory and neurological sequelae, and allergic reactions. Discussed the role of CRNA in patient's perioperative care. She understands.)        Anesthesia Quick Evaluation

## 2023-03-09 NOTE — H&P (Signed)
03/09/23  RRR CTAB  Preprocedure UCx negative  Johnny Rice 12/05/1945 [id number]   CC:    Chief Complaint  Patient presents with   Nephrolithiasis    HPI: Johnny Rice is a 77 y.o. male with PMH dementia and CVA with left-sided deficits who was recently admitted with Enterococcus urosepsis associated with a 7 mm left UPJ stone, s/p left ureteral stent placement with Dr. Apolinar Junes on 01/05/2023, who presents today to discuss a definitive stone management.  He is accompanied today by a staff member from the [facility], where he currently resides.    Follow-up CTAP without contrast on 01/11/2023 showed that the previously seen left UPJ stone had been dislodged into the left renal pelvis.  His hydronephrosis had significantly improved and stent was in appropriate position.   Today he reports no acute concerns. He is tolerating his stent well. He reports he would like to pursue whichever method of definitive stone management would offer the most anesthesia so as to minimize any potential discomfort.   He confirms today that he makes his own medical decisions and he does not desire [place] to speak with anyone else about his medical care, in keeping with his 2021 DPR with [place].   PMH:     Past Medical History:  Diagnosis Date   Hypertension     Stroke        Surgical History:      Past Surgical History:  Procedure Laterality Date   APPENDECTOMY       CYSTOSCOPY W/ URETERAL STENT PLACEMENT Left 01/05/2023    Procedure: CYSTOSCOPY WITH RETROGRADE PYELOGRAM/URETERAL STENT PLACEMENT;  Surgeon: Johnny Scotland, MD;  Location: [facility] ORS;  Service: Urology;  Laterality: Left;      Home Medications:  Allergies as of 01/30/2023   No Known Allergies         Medication List           Accurate as of January 30, 2023  2:15 PM. If you have any questions, ask your nurse or doctor.              acetaminophen 500 MG tablet Commonly known as: TYLENOL Take 2 tablets (1,000 mg total) by  mouth 3 (three) times daily as needed for mild pain, moderate pain or headache.    alfuzosin 10 MG 24 hr tablet Commonly known as: UROXATRAL Take 10 mg by mouth in the morning.    alum & mag hydroxide-simeth 200-200-20 MG/5ML suspension Commonly known as: MAALOX/MYLANTA Take 30 mLs by mouth 4 (four) times daily as needed (heartburn or indigestion).    ARIPiprazole 15 MG tablet Commonly known as: ABILIFY Take 15 mg by mouth daily.    aspirin 81 MG chewable tablet Chew 81 mg by mouth daily.    atorvastatin 20 MG tablet Commonly known as: LIPITOR Take 20 mg by mouth at bedtime.    barrier cream Crea Commonly known as: non-specified Apply 1 application topically as needed (post-toileting skin protection).    clonazePAM 0.5 MG tablet Commonly known as: KLONOPIN Take 1 tablet (0.5 mg total) by mouth 2 (two) times daily as needed.    docusate sodium 100 MG capsule Commonly known as: Colace Take 1 capsule (100 mg total) by mouth daily.    ergocalciferol 1.25 MG (50000 UT) capsule Commonly known as: VITAMIN D2 Take 50,000 Units by mouth once a week.    feeding supplement Liqd Take 237 mLs by mouth 3 (three) times daily between meals.    guaifenesin 100  MG/5ML syrup Commonly known as: ROBITUSSIN Take 300 mg by mouth every 6 (six) hours as needed for cough.    hydroxypropyl methylcellulose / hypromellose 2.5 % ophthalmic solution Commonly known as: ISOPTO TEARS / GONIOVISC Place 1 drop into both eyes 3 (three) times daily as needed for dry eyes.    hydrOXYzine 50 MG capsule Commonly known as: VISTARIL Take 50 mg by mouth every 6 (six) hours as needed (agitation).    lidocaine 2 % solution Commonly known as: XYLOCAINE Use as directed 10 mLs in the mouth or throat every 4 (four) hours as needed for mouth pain. Swish, gargle, and spit    liothyronine 25 MCG tablet Commonly known as: CYTOMEL Take 25 mcg by mouth daily.    lisinopril 40 MG tablet Commonly known as:  ZESTRIL Take 40 mg by mouth daily.    loperamide 2 MG capsule Commonly known as: IMODIUM Take 4 mg by mouth every 4 (four) hours as needed for diarrhea or loose stools. (Max 8 doses in 24 hours)    magnesium hydroxide 400 MG/5ML suspension Commonly known as: MILK OF MAGNESIA Take 30 mLs by mouth daily as needed for mild constipation.    meloxicam 15 MG tablet Commonly known as: MOBIC Take 15 mg by mouth daily.    metoprolol tartrate 25 MG tablet Commonly known as: LOPRESSOR Take 25 mg by mouth 2 (two) times daily.    multivitamin with minerals Tabs tablet Take 1 tablet by mouth daily.    polyethylene glycol powder 17 GM/SCOOP powder Commonly known as: GLYCOLAX/MIRALAX Take 17 g by mouth every other day.    polyvinyl alcohol 1.4 % ophthalmic solution Commonly known as: LIQUIFILM TEARS Place 2 drops into both eyes daily.             Allergies:  No Known Allergies   Family History:      Family History  Problem Relation Age of Onset   Heart disease Neg Hx        Social History:   reports that he has never smoked. He has never been exposed to tobacco smoke. He has never used smokeless tobacco. He reports that he does not currently use alcohol. He reports that he does not currently use drugs.   Physical Exam: BP 128/74   Pulse 68   Ht 5\' 7"  (1.702 m)   Wt 180 lb (81.6 kg)   BMI 28.19 kg/m   Constitutional:  Alert, no acute distress, nontoxic appearing HEENT: [place], AT Cardiovascular: No clubbing, cyanosis, or edema Respiratory: Normal respiratory effort, no increased work of breathing Skin: No rashes, bruises or suspicious lesions Neurologic: Left sided deficits s/p CVA Psychiatric: Normal mood and affect   Assessment & Plan:   1. Left ureteral stone 7mm left renal stone requiring definitive management, left ureteral stent remains in place.   We discussed various treatment options for his stone including ESWL vs. ureteroscopy with laser lithotripsy and stent  exchange.    We specifically discussed that ESWL is a less invasive procedure that requires less anesthesia, however patients have to pass their residual stone fragments, which may take several weeks and be associated with continued renal colic, though I anticipate his left ureteral stent will facilitate passage. By comparison, ureteroscopy is a more invasive surgical procedure that requires more anesthesia and replacement of a ureteral stent, which will remain in place for approximately 3-10 days and can be associated with flank pain, bladder pain, dysuria, urgency, frequency, urinary leakage, and gross hematuria.  Based on this conversation, he would like to proceed with ureteroscopy. Urine sample obtained today for preop UA and culture. Will call him next week to discuss timing of procedure. Will not update daughter Hollie Salk today based on patient's wishes.  - Urinalysis, Complete - CULTURE, URINE COMPREHENSIVE    Return for Will call to schedule surgery.   Carman Ching, PA-C   [facility] [place] [address], [address] [place], [place] [address] [phone]

## 2023-03-09 NOTE — Discharge Instructions (Addendum)
You have a ureteral stent in place.  This is a tube that extends from your kidney to your bladder.  This may cause urinary bleeding, burning with urination, and urinary frequency.  Please call our office or present to the ED if you develop fevers >101 or pain which is not able to be controlled with oral pain medications.  You may be given either Flomax and/ or ditropan to help with bladder spasms and stent pain in addition to pain medications.    Stent should be removed on Thursday morning.  Untape it from the head of your penis and pulled gently until the entire stent is removed.  If you have any difficulty, please call our office and we can assist.  [facility] [address] [place], [place] [address] [phone]    AMBULATORY SURGERY  DISCHARGE INSTRUCTIONS  The drugs that you were given will stay in your system until tomorrow so for the next 24 hours you should not:  Drive an automobile Make any legal decisions Drink any alcoholic beverage  You may resume regular meals tomorrow.  Today it is better to start with liquids and gradually work up to solid foods. You may eat anything you prefer, but it is better to start with liquids, then soup and crackers, and gradually work up to solid foods.  Please notify your doctor immediately if you have any unusual bleeding, trouble breathing, redness and pain at the surgery site, drainage, fever, or pain not relieved by medication.  Additional Instructions:  Please contact your physician with any problems or Same Day Surgery at [phone], Monday through Friday 6 am to 4 pm, or [place] at [facility] number at [phone].

## 2023-03-09 NOTE — Op Note (Signed)
Date of procedure: 03/09/23  Preoperative diagnosis:  7 mm left UPJ stone History of sepsis urinary source  Postoperative diagnosis:  Same as above  Procedure: Left ureteroscopy Laser lithotripsy Basket extraction of stone fragment Left ureteral stent exchange Retrograde pyelogram Interpretation of fluoroscopy less than 30 minutes  Surgeon: Vanna Scotland, MD  Anesthesia: General  Complications: None  Intraoperative findings: 7 mm stone identified in the renal pelvis.  Stone was quite hard.  All significant fragments extracted via nitinol basket.  EBL: Minimal  Specimens: None  Drains: 6 x 24 French double-J ureteral stent on left with tether  Indication: Woodruff Degolier is a 77 y.o. patient with 7 mm left renal pelvic stone who initially presented with sepsis.  He underwent emergent ureteral stent placement.  He presents today for definitive management of his stone.  After reviewing the management options for treatment,   elected to proceed with the above surgical procedure(s). We have discussed the potential benefits and risks of the procedure, side effects of the proposed treatment, the likelihood of the patient achieving the goals of the procedure, and any potential problems that might occur during the procedure or recuperation. Informed consent has been obtained.  Description of procedure:  The patient was taken to the operating room and general anesthesia was induced.  The patient was placed in the dorsal lithotomy position, prepped and draped in the usual sterile fashion, and preoperative antibiotics were administered. A preoperative time-out was performed.    A 21 French scope was advanced per urethra into the bladder.  Attention was turned to the left ureteral orifice from which a ureteral stent was seen emanating.  The distal coil of the stent was grasped and brought to level urethral meatus.  This was then cannulated using a sensor wire up to the level of the kidney.  A  dual-lumen ureteral access sheath was used just within the distal ureter and additional contrast was used to create a retrograde pyelogram on the side.   A Super Stiff wire was then introduced all the way up to the level of the kidney through the second lumen of the catheter.  The sensor wire was snapped in place as a safety wire.  A ureteral access sheath, 10/12 [place] was advanced  just proximal to the  UPJ stone.  A dual-lumen digital ureteroscope was then advanced up to the level of the stone.  A 200 m laser fiber was then brought in using dusting settings of 0.3 J and 60 Hz, the stone was dusted.  All fragments were removed using a 1.9 [place] tipless nitinol basket.  The scope was then advanced into the each of the calyces and additional stone burden was identified and dusted.  At the end of the procedure, no significant residual stone fragment remained greater than the size of the tip of the laser fiber.  A final retrograde pyelogram created roadmap to ensure that each every calyx was visualized and all significant stone burden was addressed.  There was no contrast extravasation.  The scope was then backed down the length of the ureter inspecting the ureteral integrity along the way.  There was no residual stone burden or significant ureteral injuries appreciated.  Finally, a 6 by 24 French ureteral access sheath was advanced over the safety wire up to the level of the kidney.  Upon wire removal, there was a full coil noted both within the renal pelvis as well as within the bladder.  The stent string was left at fixed to  the distal coil of the stent and secured to the patient's penile glans using Mastisol and Tegaderm.  The patient was then cleaned and dried, repositioned in supine position, reversed of anesthesia, taken to the PACU in stable condition.  Plan: Stent may be removed on Thursday.  Follow-up in 4 weeks with renal ultrasound prior.  Vanna Scotland, M.D.

## 2023-03-09 NOTE — OR Nursing (Signed)
85f x 24cm Stent removed from left ureter at 10:19 on 03/09/23.

## 2023-03-09 NOTE — Transfer of Care (Signed)
Immediate Anesthesia Transfer of Care Note  Patient: Johnny Rice  Procedure(s) Performed: CYSTOSCOPY/URETEROSCOPY/HOLMIUM LASER/STENT PLACEMENT/RETROGRADE PYELOGRAM (Left)  Patient Location: PACU  Anesthesia Type:General  Level of Consciousness: awake and drowsy  Airway & Oxygen Therapy: Patient Spontanous Breathing  Post-op Assessment: Report given to RN and Post -op Vital signs reviewed and stable  Post vital signs: Reviewed and stable  Last Vitals:  Vitals Value Taken Time  BP 172/101 03/09/23 1052  Temp    Pulse 81 03/09/23 1058  Resp 15 03/09/23 1058  SpO2 98 % 03/09/23 1058  Vitals shown include unvalidated device data.  Last Pain:  Vitals:   03/09/23 0939  TempSrc: Temporal  PainSc: 0-No pain         Complications: No notable events documented.

## 2023-03-10 ENCOUNTER — Other Ambulatory Visit: Payer: Self-pay

## 2023-03-10 ENCOUNTER — Encounter: Payer: Self-pay | Admitting: Urology

## 2023-03-10 DIAGNOSIS — N201 Calculus of ureter: Secondary | ICD-10-CM

## 2023-04-02 ENCOUNTER — Ambulatory Visit
Admission: RE | Admit: 2023-04-02 | Discharge: 2023-04-02 | Disposition: A | Discharge status: Home / Self Care | Payer: Medicare Other | Source: Ambulatory Visit | Attending: Urology | Admitting: Urology

## 2023-04-02 DIAGNOSIS — N201 Calculus of ureter: Secondary | ICD-10-CM | POA: Insufficient documentation

## 2023-04-08 ENCOUNTER — Ambulatory Visit: Payer: Medicare Other | Admitting: Urology

## 2023-04-08 ENCOUNTER — Encounter: Payer: Self-pay | Admitting: Urology

## 2023-05-27 ENCOUNTER — Ambulatory Visit: Payer: Medicare Other | Admitting: Urology

## 2023-05-28 ENCOUNTER — Ambulatory Visit: Payer: Medicare Other | Admitting: Urology
